# Patient Record
Sex: Male | Born: 1935 | Race: White | Hispanic: No | Marital: Married | Smoking: Never smoker
Health system: Southern US, Community
[De-identification: ages and names within clinical notes are randomized; demographics above are authoritative.]

## PROBLEM LIST (undated history)

## (undated) DIAGNOSIS — G309 Alzheimer's disease, unspecified: Secondary | ICD-10-CM

## (undated) DIAGNOSIS — M109 Gout, unspecified: Secondary | ICD-10-CM

## (undated) DIAGNOSIS — N200 Calculus of kidney: Secondary | ICD-10-CM

## (undated) DIAGNOSIS — N529 Male erectile dysfunction, unspecified: Secondary | ICD-10-CM

## (undated) DIAGNOSIS — E785 Hyperlipidemia, unspecified: Secondary | ICD-10-CM

## (undated) DIAGNOSIS — E119 Type 2 diabetes mellitus without complications: Secondary | ICD-10-CM

## (undated) DIAGNOSIS — I639 Cerebral infarction, unspecified: Secondary | ICD-10-CM

## (undated) DIAGNOSIS — I10 Essential (primary) hypertension: Secondary | ICD-10-CM

## (undated) DIAGNOSIS — R1310 Dysphagia, unspecified: Secondary | ICD-10-CM

## (undated) DIAGNOSIS — I69352 Hemiplegia and hemiparesis following cerebral infarction affecting left dominant side: Secondary | ICD-10-CM

## (undated) DIAGNOSIS — F0280 Dementia in other diseases classified elsewhere without behavioral disturbance: Secondary | ICD-10-CM

## (undated) DIAGNOSIS — N400 Enlarged prostate without lower urinary tract symptoms: Secondary | ICD-10-CM

## (undated) DIAGNOSIS — R011 Cardiac murmur, unspecified: Secondary | ICD-10-CM

## (undated) HISTORY — PX: LITHOTRIPSY: SUR834

## (undated) HISTORY — DX: Gout, unspecified: M10.9

## (undated) HISTORY — PX: APPENDECTOMY: SHX54

## (undated) HISTORY — DX: Dementia in other diseases classified elsewhere, unspecified severity, without behavioral disturbance, psychotic disturbance, mood disturbance, and anxiety: F02.80

## (undated) HISTORY — DX: Essential (primary) hypertension: I10

## (undated) HISTORY — PX: UVULOPALATOPHARYNGOPLASTY: SHX827

## (undated) HISTORY — DX: Hemiplegia and hemiparesis following cerebral infarction affecting left dominant side: I69.352

## (undated) HISTORY — DX: Cerebral infarction, unspecified: I63.9

## (undated) HISTORY — DX: Cardiac murmur, unspecified: R01.1

## (undated) HISTORY — DX: Hyperlipidemia, unspecified: E78.5

## (undated) HISTORY — DX: Male erectile dysfunction, unspecified: N52.9

## (undated) HISTORY — PX: TONSILLECTOMY: SUR1361

## (undated) HISTORY — DX: Alzheimer's disease, unspecified: G30.9

## (undated) HISTORY — DX: Type 2 diabetes mellitus without complications: E11.9

## (undated) HISTORY — DX: Benign prostatic hyperplasia without lower urinary tract symptoms: N40.0

---

## 1997-11-05 ENCOUNTER — Ambulatory Visit (HOSPITAL_COMMUNITY): Admission: RE | Admit: 1997-11-05 | Discharge: 1997-11-05 | Payer: Self-pay | Admitting: Urology

## 2009-12-28 ENCOUNTER — Encounter: Admission: RE | Admit: 2009-12-28 | Discharge: 2009-12-28 | Payer: Self-pay | Admitting: Internal Medicine

## 2010-01-05 ENCOUNTER — Ambulatory Visit (HOSPITAL_COMMUNITY): Admission: RE | Admit: 2010-01-05 | Discharge: 2010-01-05 | Payer: Self-pay | Admitting: Gastroenterology

## 2010-02-09 ENCOUNTER — Ambulatory Visit (HOSPITAL_COMMUNITY): Admission: RE | Admit: 2010-02-09 | Discharge: 2010-02-09 | Payer: Self-pay | Admitting: Gastroenterology

## 2010-05-10 ENCOUNTER — Encounter: Admission: RE | Admit: 2010-05-10 | Discharge: 2010-05-10 | Payer: Self-pay | Admitting: Gastroenterology

## 2010-10-23 LAB — GLUCOSE, CAPILLARY: Glucose-Capillary: 171 mg/dL — ABNORMAL HIGH (ref 70–99)

## 2010-10-24 LAB — BASIC METABOLIC PANEL
Calcium: 9.1 mg/dL (ref 8.4–10.5)
GFR calc Af Amer: >60 mL/min (ref >60)
GFR calc non Af Amer: 59 mL/min — ABNORMAL LOW (ref >60)
Glucose, Bld: 191 mg/dL — ABNORMAL HIGH (ref 70–99)
Potassium: 3.8 mEq/L (ref 3.5–5.1)
Sodium: 143 mEq/L (ref 135–145)

## 2010-10-24 LAB — CBC
Hemoglobin: 12.1 g/dL — ABNORMAL LOW (ref 13.0–17.0)
RBC: 3.88 MIL/uL — ABNORMAL LOW (ref 4.22–5.81)
RDW: 12.6 % (ref 11.5–15.5)
WBC: 9.1 10*3/uL (ref 4.0–10.5)

## 2010-10-24 LAB — GLUCOSE, CAPILLARY

## 2010-11-18 ENCOUNTER — Other Ambulatory Visit: Payer: Self-pay | Admitting: Gastroenterology

## 2010-11-18 DIAGNOSIS — K862 Cyst of pancreas: Secondary | ICD-10-CM

## 2010-11-23 ENCOUNTER — Ambulatory Visit
Admission: RE | Admit: 2010-11-23 | Discharge: 2010-11-23 | Disposition: A | Discharge status: Home / Self Care | Payer: Medicare Other | Source: Ambulatory Visit | Attending: Gastroenterology | Admitting: Gastroenterology

## 2010-11-23 ENCOUNTER — Other Ambulatory Visit: Payer: Self-pay | Admitting: Gastroenterology

## 2010-11-23 DIAGNOSIS — K862 Cyst of pancreas: Secondary | ICD-10-CM

## 2010-11-23 MED ORDER — GADOBENATE DIMEGLUMINE 529 MG/ML IV SOLN
15.0000 mL | Freq: Once | INTRAVENOUS | Status: AC | PRN
  Administered 2010-11-23: 15 mL via INTRAVENOUS

## 2012-02-15 ENCOUNTER — Other Ambulatory Visit: Payer: Self-pay | Admitting: Gastroenterology

## 2012-02-15 DIAGNOSIS — K862 Cyst of pancreas: Secondary | ICD-10-CM

## 2012-02-21 ENCOUNTER — Ambulatory Visit
Admission: RE | Admit: 2012-02-21 | Discharge: 2012-02-21 | Disposition: A | Discharge status: Home / Self Care | Payer: Medicare Other | Source: Ambulatory Visit | Attending: Gastroenterology | Admitting: Gastroenterology

## 2012-02-21 DIAGNOSIS — K862 Cyst of pancreas: Secondary | ICD-10-CM

## 2012-02-21 MED ORDER — GADOBENATE DIMEGLUMINE 529 MG/ML IV SOLN
14.0000 mL | Freq: Once | INTRAVENOUS | Status: AC | PRN
  Administered 2012-02-21: 14 mL via INTRAVENOUS

## 2013-08-14 ENCOUNTER — Ambulatory Visit (INDEPENDENT_AMBULATORY_CARE_PROVIDER_SITE_OTHER): Payer: Medicare Other | Admitting: Neurology

## 2013-08-14 ENCOUNTER — Encounter: Payer: Self-pay | Admitting: Neurology

## 2013-08-14 VITALS — BP 104/62 | HR 84 | Resp 25 | Ht 68.0 in | Wt 146.6 lb

## 2013-08-14 DIAGNOSIS — R413 Other amnesia: Secondary | ICD-10-CM

## 2013-08-14 DIAGNOSIS — G3184 Mild cognitive impairment, so stated: Secondary | ICD-10-CM

## 2013-08-14 DIAGNOSIS — F0280 Dementia in other diseases classified elsewhere without behavioral disturbance: Secondary | ICD-10-CM | POA: Insufficient documentation

## 2013-08-14 DIAGNOSIS — G309 Alzheimer's disease, unspecified: Secondary | ICD-10-CM

## 2013-08-14 NOTE — Progress Notes (Addendum)
NEUROLOGY CONSULTATION NOTE  Roger [name] MRN: [id number] DOB: 02-16-1931  Referring provider: Dr. Earl Gala Primary care provider: Dr. Earl Gala  Reason for consult:  Memory loss.  Question of Lewy Body Dementia.  HISTORY OF PRESENT ILLNESS: Roger [name] is an 80 year old right-handed man with history of hypertension, OSA with CPAP, CKD and type II diabetes mellitus, who presents for memory problems.  He is not accompanied by anyone.  Records and images were personally reviewed where available.  He says he began noticing problems with memory approximately one year ago, however the notes state that he was taking Aricept as early as 2011.  Regarding memory, he specifically notes difficulty recalling people's names, including close friends that he is known since childhood. He often misplaces objects around the house. Occasionally, he will walk into a room and not remember why he went there. He is told that he often repeats questions. Occasionally, he does not remember why he and his wife are going somewhere.  He is able to perform activities of daily living.  He often works in the garden and the tends to the yard.  He is active and participates with charitable activities.  He does drive.  He says he occasionally gets disoriented but nothing significant.  He does not appear to be an aggressive driver.  He does not tailgate or drive fast.  He says that his wife does feel uncomfortable in the car with him and often prefers to do the driving herself.  He does take naps during the day and says that his wife tells him that he sleeps too much.  He denies hallucinations or delusions.  He denies depression.  He does get up in the middle of the night to go to the bathroom and sometimes has difficulty falling back to sleep. He scored 2.5 on the Clinical Dementia Rating, so he was advised not to drive.  He is unsure which medications he tried.  According to the notes, he had tried donepezil but was discontinued because  of nightmares, even at low dose of 5mg .  He was then started on rivastigmine, but this was stopped due to nightmares and sleep disturbance.  Based on the notes, he reportedly had tried Namenda in the past, with similar effects.  According to notes, his wife says he also has been exhibiting a shuffling gait.  He has trouble getting into bed without a stepstool.  He does not have a tremor.  He notes that he does cough at times when eating.  He says he eats slowly, having to chew his food longer.  He has reduced appetite and has experienced weight loss and is borderline malnourished.  He has had problems with urinary incontinence as well.  There was a concern for possible parkinsonism or even Lewy Body Dementia.  He does not have tremor.  He has no history of falls.  He denies symptoms associated with freezing.  When he gets out of bed, he does feel brief vertigo but no lightheadedness.  He is not aware of any family history of a neurodegenerative disease.  He has no history of stroke.  07/23/13 LABS:  Na 142, K 3.9, Cl 3.9, glucose 167, BUN 19, Cr 1.25, Ca 9.7, ALP 70, AST 17, ALT 10.  PAST MEDICAL HISTORY: Past Medical History  Diagnosis Date  . Diabetes   . Hypertension   . Gout     PAST SURGICAL HISTORY: Past Surgical History  Procedure Laterality Date  . Tonsillectomy  MEDICATIONS: No current outpatient prescriptions on file prior to visit.   No current facility-administered medications on file prior to visit.    ALLERGIES: No Known Allergies  FAMILY HISTORY: No family history on file.  SOCIAL HISTORY: History   Social History  . Marital Status: Married    Spouse Name: N/A    Number of Children: N/A  . Years of Education: N/A   Occupational History  . Not on file.   Social History Main Topics  . Smoking status: Never Smoker   . Smokeless tobacco: Not on file  . Alcohol Use: No  . Drug Use: No  . Sexual Activity: Not on file   Other Topics Concern  . Not on  file   Social History Narrative  . No narrative on file    REVIEW OF SYSTEMS: Constitutional: No fevers, chills, or sweats, no generalized fatigue, change in appetite Eyes: No visual changes, double vision, eye pain Ear, nose and throat: No hearing loss, ear pain, nasal congestion, sore throat Cardiovascular: No chest pain, palpitations Respiratory:  No shortness of breath at rest or with exertion, wheezes GastrointestinaI: No nausea, vomiting, diarrhea, abdominal pain, fecal incontinence Genitourinary:  No dysuria.  Some incontinence Musculoskeletal:  No neck pain, back pain Integumentary: No rash, pruritus, skin lesions Neurological: as above Psychiatric: No depression, insomnia, anxiety Endocrine: No palpitations, fatigue, diaphoresis, mood swings, change in appetite, change in weight, increased thirst Hematologic/Lymphatic:  No anemia, purpura, petechiae. Allergic/Immunologic: no itchy/runny eyes, nasal congestion, recent allergic reactions, rashes  PHYSICAL EXAM: Filed Vitals:   08/14/13 0915  BP: 104/62  Pulse: 84  Resp: 25   General: No acute distress Head:  Normocephalic/atraumatic Neck: supple, no paraspinal tenderness, full range of motion Back: No paraspinal tenderness Heart: regular rate and rhythm Lungs: Clear to auscultation bilaterally. Vascular: No carotid bruits. Neurological Exam: Mental status: alert and oriented to person, place, and time, speech fluent and not dysarthric, naming, reading, writing, and repeating intact.  Able to follow 3 step commands across midline.  Poor naming fluency.  Attention is fair.  Abstraction intact.  Recalled 1 out of 3 words on MMSE and unable to recall any words on MOCA.  Able to perform Trail Making test, copying a cube, intersecting pentagons and drawing a clock correctly.  MMSE 25/30, MOCA 24/30. Cranial nerves: CN I: not tested CN II: pupils equal, round and reactive to light, visual fields intact, fundi unremarkable. CN  III, IV, VI:  full range of motion, no nystagmus, no ptosis CN V: facial sensation intact CN VII: upper and lower face symmetric CN VIII: hearing intact CN IX, X: gag intact, uvula midline CN XI: sternocleidomastoid and trapezius muscles intact CN XII: tongue midline Bulk & Tone: normal, no fasciculations. Motor: 5/5 throughout, no rigidity or bradykinesia, although he has mild reduced amplitude of finger-thumb tapping but normal speed.  No resting, postural or kinetic tremor. Sensation: temperature and vibration intact. Deep Tendon Reflexes: 2+throughout, but absent in ankles, toes down Finger to nose testing: no tremor, dysmetria Gait: upright posture with normal arm swing.  No shuffling.  Good stride and able to pick up feet.  Turns quickly in 1 step. Able to walk in tandem.  Romberg negative.  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  He does not exhibit symptoms consistent with Lewy Body Dementia or Parkinson's disease.  The cognitive impairment primarily involves memory and visuospatial and executive functioning are intact.  I don't exhibit any significant parkinsonism on exam.  He does not have hallucinations.  As for the driving, further evaluation is recommended if MMSE is <24.  He scored a 25.  However, based on the previous Clinical Dementia Rating score, and the fact that his wife reportedly feels unsafe in the car, I cannot recommend that he can drive until his driving is evaluated by an OT.  PLAN: 1.  Referral to OT for driving assessment. 2.  Given that he has not responded to three medications, I don't think it is worth trying another medication at this point.  We can consider galantamine, however it is also a cholinesterase inhibitor with same side effect profile. 3.  Recommend continuing to remain active and social 4.  Provided info of support groups and websites. 5.  He says he started a B12 supplement a couple of months  ago.  We will check B12 level to make sure it is okay.  We will check TSH as well. 5.  Follow up in 6 months or as needed.  60 minutes spent with patient, over 50% spent counseling and coordinating care.  Thank you for allowing me to take part in the care of this patient.  Shon MilletAdam Jaffe, DO  CC:  Theressa MillardJames Osborne, MD

## 2013-08-14 NOTE — Patient Instructions (Addendum)
Mr. Roger [name], I think you do have Alzheimer's disease.  This is a progressive condition, but right now, I feel you are doing well. 1.  You have had side effects to 3 common medications used, so I really don't think trying another medication would be much helpful, since they all have similar side effects.  2.  At this point, I don't think you should drive until you are evaluated by an occupational therapist to assess your driving.  It will cost $250 but would be worth it to see if you can drive.  We will give you this information. We will send a referral for you. The contact name is Renata Caprice and his number is [phone]. 3.  Continue to stay active and social. 4.  We will provide you information with websites and support groups. 5.  Follow up in 6 months but call with questions or concerns.

## 2013-08-15 ENCOUNTER — Telehealth: Payer: Self-pay | Admitting: Neurology

## 2013-08-15 LAB — VITAMIN B12: Vitamin B-12: 1032 pg/mL — ABNORMAL HIGH (ref 211–911)

## 2013-08-15 LAB — TSH: TSH: 1.608 u[IU]/mL (ref 0.350–4.500)

## 2013-08-15 NOTE — Telephone Encounter (Signed)
Message copied by Benay Spice on Fri Aug 15, 2013  9:09 AM ------      Message from: JAFFE, ADAM R      Created: Fri Aug 15, 2013  5:50 AM       Blood work looks okay.      ----- Message -----         From: [facility]         Sent: 08/15/2013   2:22 AM           To: Cira Servant, DO                   ------

## 2013-08-15 NOTE — Telephone Encounter (Signed)
Spoke with the patient's wife. Information given as per Dr. Everlena CooperJaffe below. No additional questions.

## 2013-12-15 ENCOUNTER — Ambulatory Visit: Payer: Medicare Other | Admitting: Neurology

## 2013-12-16 ENCOUNTER — Telehealth: Payer: Self-pay | Admitting: Neurology

## 2013-12-16 NOTE — Telephone Encounter (Signed)
Pt no showed 12/15/13 follow up appt w/ Dr. Everlena Cooper. No show letter mailed to pt / Sherri S.

## 2014-02-11 ENCOUNTER — Ambulatory Visit: Payer: Medicare Other | Admitting: Neurology

## 2014-02-13 ENCOUNTER — Ambulatory Visit (INDEPENDENT_AMBULATORY_CARE_PROVIDER_SITE_OTHER): Payer: Medicare Other | Admitting: Neurology

## 2014-02-13 ENCOUNTER — Encounter: Payer: Self-pay | Admitting: Neurology

## 2014-02-13 VITALS — BP 162/74 | HR 60 | Resp 14 | Ht 68.5 in | Wt 139.0 lb

## 2014-02-13 DIAGNOSIS — F0280 Dementia in other diseases classified elsewhere without behavioral disturbance: Secondary | ICD-10-CM

## 2014-02-13 DIAGNOSIS — R279 Unspecified lack of coordination: Secondary | ICD-10-CM

## 2014-02-13 DIAGNOSIS — R270 Ataxia, unspecified: Secondary | ICD-10-CM

## 2014-02-13 DIAGNOSIS — R1314 Dysphagia, pharyngoesophageal phase: Secondary | ICD-10-CM

## 2014-02-13 DIAGNOSIS — G309 Alzheimer's disease, unspecified: Principal | ICD-10-CM

## 2014-02-13 NOTE — Progress Notes (Signed)
Roger [name] was seen today in the movement disorders clinic for neurologic consultation at the request of Roger Finer, MD.  This is a second opinion regarding parkinsonism.  The patient has a ready seen my partner, Dr. Tomi [name], but the patient did not show up for his followup visit in May with him.  The patient is accompanied by his wife, who supplements the history.    Memory problems began over one year ago per wife, and it is noted that aricept was first trialed in 2011.  Pt notes that early on he would have trouble following conversations.  His wife noted that early problems included word finding problems and naming trouble.  No hallucinations.  Some loss of bladder control; started at night and has become a daytime issue for about 6-8 months.  He has had slowness and balance problems for about 6-8 months to perhaps a year.    Specific Symptoms:  Tremor: No. Voice: yes, hypophonic speech Sleep: trouble staying asleep because of nocturia  Vivid Dreams:  No.  Acting out dreams:  No. Wet Pillows: No. Postural symptoms:  No.  Falls?  No. Bradykinesia symptoms: slow movements and difficulty getting out of a chair (slower); walks slower Loss of smell:  No. Loss of taste:  Yes.   Urinary Incontinence:  Yes.   Difficulty Swallowing:  Yes.   (big vitamin pills and may get "strangled" on foods; takes long time to eat; eats vegetables well but long time to eat.  His wife is concerned about weight loss.  She bought boost, but raised blood sugars.  She has tried Glucerna.) Handwriting, micrographia: No. Trouble with ADL's:  No.  Trouble buttoning clothing: No. Depression:  No. Memory changes:  Yes (wife does finance, drives, helps with pills, and does the cooking) Hallucinations:  No.  visual distortions: No. N/V:  No. Lightheaded:  Yes.   if gets up too quickly  Syncope: No. Diplopia:  No. Dyskinesia:  No.  Neuroimaging has not previously been performed.    PREVIOUS  MEDICATIONS:  Aricept, Exelon and Namenda (nightmares with Roger [name])  ALLERGIES:  No Known Allergies  CURRENT MEDICATIONS:  Current Outpatient Prescriptions on File Prior to Visit  Medication Sig Dispense Refill  . Blood Glucose Monitoring Suppl (ONE TOUCH ULTRA 2) W/DEVICE KIT       . COLCRYS 0.6 MG tablet Take 0.6 mg by mouth daily.       Marland Kitchen losartan (COZAAR) 100 MG tablet Take 100 mg by mouth daily.       . metFORMIN (GLUCOPHAGE-XR) 500 MG 24 hr tablet Take 500 mg by mouth daily with breakfast.       . Multiple Vitamin (MULTIVITAMIN) tablet Take 1 tablet by mouth daily.      . Red Yeast Rice Extract (RED YEAST RICE PO) Take by mouth daily.      Marland Kitchen Specialty Vitamins Products (PROSTATE PO) Take by mouth daily.       No current facility-administered medications on file prior to visit.    PAST MEDICAL HISTORY:   Past Medical History  Diagnosis Date  . Diabetes   . Hypertension   . Gout     PAST SURGICAL HISTORY:   Past Surgical History  Procedure Laterality Date  . Tonsillectomy    . Appendectomy    . Lithotripsy    . Uvulopalatopharyngoplasty      for snoring    SOCIAL HISTORY:   History   Social History  . Marital Status: Married  Spouse Name: N/A    Number of Children: N/A  . Years of Education: N/A   Occupational History  . retired     Engineer, materials   Social History Main Topics  . Smoking status: Never Smoker   . Smokeless tobacco: Not on file  . Alcohol Use: No  . Drug Use: No  . Sexual Activity: Not on file   Other Topics Concern  . Not on file   Social History Narrative  . No narrative on file    FAMILY HISTORY:   Family Status  Relation Status Death Age  . Mother Deceased     "old age"  . Father Deceased     HTN, leukemia  . Brother Deceased     suicide  . Sister Deceased     ovarian cancer  . Sister Alive     stroke  . Son Deceased     MVA  . Daughter Alive     healthy    ROS:  A complete 10 system review of systems was obtained and  was unremarkable apart from what is mentioned above.  PHYSICAL EXAMINATION:    VITALS:  Patient has not taken his blood pressure medication today. Filed Vitals:   02/13/14 0825  BP: 162/74  Pulse: 60  Resp: 14  Height: 5' 8.5" (1.74 m)  Weight: 139 lb (63.05 kg)   Wt Readings from Last 3 Encounters:  02/13/14 139 lb (63.05 kg)  08/14/13 146 lb 9 oz (66.48 kg)     GEN:  The patient appears stated age and is in NAD. HEENT:  Normocephalic, atraumatic.  The mucous membranes are moist. The superficial temporal arteries are without ropiness or tenderness.  He does have a black coating on the tongue CV:  RRR Lungs:  CTAB Neck/HEME:  There are no carotid bruits bilaterally.  Neurological examination:  Orientation:   Montreal Cognitive Assessment  02/13/2014  Visuospatial/ Executive (0/5) 4  Naming (0/3) 3  Attention: Read list of digits (0/2) 2  Attention: Read list of letters (0/1) 1  Attention: Serial 7 subtraction starting at 100 (0/3) 1  Language: Repeat phrase (0/2) 2  Language : Fluency (0/1) 0  Abstraction (0/2) 2  Delayed Recall (0/5) 0  Orientation (0/6) 3  Total 18  Adjusted Score (based on education) 18   Pt scored a 18/30 on the MoCA today.   Pt scored a 24/30 on his MoCA in January, 2015.  He has some difficulty following the conversation. Cranial nerves: There is good facial symmetry. Pupils are equal round but minimally reactive to light bilaterally. Fundoscopic exam is attempted but the disc margins are not well visualized bilaterally.  Extraocular muscles are intact. The visual fields are full to confrontational testing. The speech is fluent and clear.  He is hypophonic.  Soft palate rises symmetrically and there is no tongue deviation. Hearing is decreased to conversational tone. Sensation: Sensation is intact to light and pinprick throughout (facial, trunk, extremities). Vibration is intact at the bilateral big toe. There is no extinction with double  simultaneous stimulation. There is no sensory dermatomal level identified. Motor: Strength is 5/5 in the bilateral upper and lower extremities.   Shoulder shrug is equal and symmetric.  There is no pronator drift. Deep tendon reflexes: Deep tendon reflexes are 3-3+/4 at the bilateral biceps, triceps, brachioradialis, patella and achilles.  Cross adductor reflexes are obtained.  Pectoral reflexes are obtained. Plantar responses are downgoing bilaterally. Frontal release signs: Patient has bilateral palmomental, glabellar, snout and  rooting reflexes.  Movement examination: Tone: There is normal tone in the bilateral upper extremities.  The tone in the lower extremities is normal.  Abnormal movements: None Coordination:  There is no significant decremation with RAM's Gait and Station: The patient has mild difficulty arising out of a deep-seated chair without the use of the hands.  He is successful in the second attempt.  His gait is unsteady.  ASSESSMENT/PLAN:  1.  Dementia.  -I. agree with Dr. Tomi [name] that this likely represents Alzheimer's dementia, but it has progressed more rapidly than one would expect.  His MoCA in January for Dr. Tomi [name] was 24/30 and today it was 18/30.  He may need a bigger workup for more rapidly progressive dementias, but I will leave that to the expertise of Dr. Tomi [name].  - I see no evidence of Lewy body dementia.  I see no evidence of any parkinsonian state, including idiopathic Parkinson's disease.  -The patient may benefit from neuro imaging given the rapidly progressive dementia and hyperreflexia, but again I will leave that to the expertise of Dr. Tomi [name].  I did discuss this with Dr. Tomi [name]. 2.  Dysphagia.  -Patient would likely benefit from a modified barium swallow.  He has lost weight.  He will discuss this with Dr. Tomi [name] at his appointment that I made for him.  We discussed adding Glucerna more regularly.  If that raises his blood sugar, then we may need to refer him for  a nutritional consult. 3.  Ataxia.  -I. think he would benefit from some physical therapy, even if it his home physical therapy.  He was somewhat resistant to this, but said that he would think about this and discuss it with Dr. Tomi [name] at his next appointment. 4.  we discussed safety extensively, as it relates to memory loss.  He is no longer driving.  His wife has taken over the finances.  Greater than 50% of 60 min visit in counseling 5.  F/u with Dr. Tomi [name].  Appt made with him and expressed importance of keeping that appointment.

## 2014-02-20 ENCOUNTER — Ambulatory Visit (INDEPENDENT_AMBULATORY_CARE_PROVIDER_SITE_OTHER): Payer: Medicare Other | Admitting: Neurology

## 2014-02-20 ENCOUNTER — Encounter: Payer: Self-pay | Admitting: Neurology

## 2014-02-20 VITALS — BP 140/68 | HR 68 | Temp 98.0°F | Resp 18 | Wt 141.0 lb

## 2014-02-20 DIAGNOSIS — R292 Abnormal reflex: Secondary | ICD-10-CM

## 2014-02-20 DIAGNOSIS — G309 Alzheimer's disease, unspecified: Principal | ICD-10-CM

## 2014-02-20 DIAGNOSIS — F0280 Dementia in other diseases classified elsewhere without behavioral disturbance: Secondary | ICD-10-CM

## 2014-02-20 DIAGNOSIS — R4789 Other speech disturbances: Secondary | ICD-10-CM

## 2014-02-20 DIAGNOSIS — R4702 Dysphasia: Secondary | ICD-10-CM

## 2014-02-20 MED ORDER — MEMANTINE HCL ER 7 MG PO CP24: ORAL_CAPSULE | ORAL | Status: DC

## 2014-02-20 NOTE — Patient Instructions (Addendum)
1.  We will start memantine ER (Namenda XR) 7mg  tablets.  We will increase dose to goal of 28mg  daily, as per the following schedule.  Start 1 tablet daily for one week, then 2 tablets daily for one week, then 3 tablets daily for one week.  At that point, call the clinic and we can prescribe a larger single dose tablet of 28mg , to be taken daily.  Side effects include dizziness, headache, diarrhea or constipation.  Call with any questions or concerns. 2.  We will get MRI of the brain 3.  We will schedule you for a modified barium swallow to check your swallowing 4.  We will schedule you for neuropsychological testing.

## 2014-02-20 NOTE — Progress Notes (Addendum)
NEUROLOGY FOLLOW UP OFFICE NOTE  Roger [name] [id number]  HISTORY OF PRESENT ILLNESS: Roger [name] is an 80 year old right-handed man with history of hypertension, OSA with CPAP, CKD and type II diabetes mellitus, who follows up for memory problems.  He is not accompanied by anyone.  Records and images were personally reviewed where available.    UPDATE: 08/14/13 LABS:  B12 1032, TSH 1.608  Underwent a formal driving assessment on 09/09/13.  He did demonstrate some minor deficits, such as with slow cognitive processing when he was unsure how to turn off the radio, and with divided attention when he significantly slowed the rate of speed when trying to answer a questions.  He was scheduled for a follow-up but never showed or rescheduled.  She was seen by my colleague, Dr. Carles Collet, when he demonstrated a MoCA score of 18/30, which was a fairly significant decline compared to his score of 24/30 in January.  He also exhibited hyperreflexia on exam as well.   He and his wife says things are stable.  He drives locally.  He still notes trouble swallowing meat and thin liquids.  HISTORY: He says he began noticing problems with memory approximately one year ago, however the notes state that he was taking Aricept as early as 2011.  Regarding memory, he specifically notes difficulty recalling people's names, including close friends that he is known since childhood. He often misplaces objects around the house. Occasionally, he will walk into a room and not remember why he went there. He is told that he often repeats questions. Occasionally, he does not remember why he and his wife are going somewhere.  He is able to perform activities of daily living.  He often works in the garden and the tends to the yard.  He is active and participates with charitable activities.  He does drive.  He says he occasionally gets disoriented but nothing significant.  He does not appear to be an aggressive driver.  He does not tailgate  or drive fast.  He says that his wife does feel uncomfortable in the car with him and often prefers to do the driving herself.  He does take naps during the day and says that his wife tells him that he sleeps too much.  He denies hallucinations or delusions.  He denies depression.  He does get up in the middle of the night to go to the bathroom and sometimes has difficulty falling back to sleep. He scored 2.5 on the Clinical Dementia Rating, so he was advised not to drive.  He is unsure which medications he tried.  According to the notes, he had tried donepezil but was discontinued because of nightmares, even at low dose of 87m.  He was then started on rivastigmine, but this was stopped due to nightmares and sleep disturbance.  Based on the notes, he reportedly had tried Namenda in the past, with similar effects.  According to notes, his wife says he also has been exhibiting a shuffling gait.  He has trouble getting into bed without a stepstool.  He does not have a tremor.  He notes that he does cough at times when eating.  He says he eats slowly, having to chew his food longer.  He has reduced appetite and has experienced weight loss and is borderline malnourished.  He has had problems with urinary incontinence as well.  There was a concern for possible parkinsonism or even Lewy Body Dementia.  He does not have tremor.  He has no history of falls.  He denies symptoms associated with freezing.  When he gets out of bed, he does feel brief vertigo but no lightheadedness.  He is not aware of any family history of a neurodegenerative disease.  He has no history of stroke.  07/23/13 LABS:  Na 142, K 3.9, Cl 3.9, glucose 167, BUN 19, Cr 1.25, Ca 9.7, ALP 70, AST 17, ALT 10.  PAST MEDICAL HISTORY: Past Medical History  Diagnosis Date  . Diabetes   . Hypertension   . Gout     MEDICATIONS: Current Outpatient Prescriptions on File Prior to Visit  Medication Sig Dispense Refill  . Blood Glucose Monitoring  Suppl (ONE TOUCH ULTRA 2) W/DEVICE KIT       . COLCRYS 0.6 MG tablet Take 0.6 mg by mouth daily.       Marland Kitchen losartan (COZAAR) 100 MG tablet Take 100 mg by mouth daily.       . metFORMIN (GLUCOPHAGE-XR) 500 MG 24 hr tablet Take 500 mg by mouth daily with breakfast.       . Multiple Vitamin (MULTIVITAMIN) tablet Take 1 tablet by mouth daily.      . Red Yeast Rice Extract (RED YEAST RICE PO) Take by mouth daily.      Marland Kitchen Specialty Vitamins Products (PROSTATE PO) Take by mouth daily.       No current facility-administered medications on file prior to visit.    ALLERGIES: No Known Allergies  FAMILY HISTORY: Family History  Problem Relation Age of Onset  . Ataxia Neg Hx   . Chorea Neg Hx   . Dementia Neg Hx   . Mental retardation Neg Hx   . Migraines Neg Hx   . Multiple sclerosis Neg Hx   . Neurofibromatosis Neg Hx   . Neuropathy Neg Hx   . Parkinsonism Neg Hx   . Seizures Neg Hx   . Stroke Neg Hx     SOCIAL HISTORY: History   Social History  . Marital Status: Married    Spouse Name: N/A    Number of Children: N/A  . Years of Education: N/A   Occupational History  . retired     Engineer, materials   Social History Main Topics  . Smoking status: Never Smoker   . Smokeless tobacco: Not on file  . Alcohol Use: No  . Drug Use: No  . Sexual Activity: Not on file   Other Topics Concern  . Not on file   Social History Narrative  . No narrative on file    REVIEW OF SYSTEMS: Constitutional: No fevers, chills, or sweats, no generalized fatigue, change in appetite Eyes: No visual changes, double vision, eye pain Ear, nose and throat: No hearing loss, ear pain, nasal congestion, sore throat Cardiovascular: No chest pain, palpitations Respiratory:  No shortness of breath at rest or with exertion, wheezes GastrointestinaI: No nausea, vomiting, diarrhea, abdominal pain, fecal incontinence Genitourinary:  No dysuria, urinary retention or frequency Musculoskeletal:  No neck pain, back  pain Integumentary: No rash, pruritus, skin lesions Neurological: as above Psychiatric: No depression, insomnia, anxiety Endocrine: No palpitations, fatigue, diaphoresis, mood swings, change in appetite, change in weight, increased thirst Hematologic/Lymphatic:  No anemia, purpura, petechiae. Allergic/Immunologic: no itchy/runny eyes, nasal congestion, recent allergic reactions, rashes  PHYSICAL EXAM: Filed Vitals:   02/20/14 1028  BP: 140/68  Pulse: 68  Temp: 98 F (36.7 C)  Resp: 18   General: No acute distress Head:  Normocephalic/atraumatic Neck: supple, no paraspinal tenderness,  full range of motion Heart:  Regular rate and rhythm Lungs:  Clear to auscultation bilaterally Back: No paraspinal tenderness Neurological Exam: alert and oriented to person, place, month and day (not date or year). Attention span and concentration intact, recent memory impaired and remote memory intact, fund of knowledge intact.  Speech fluent and not dysarthric, language intact.  CN II-XII intact. Fundoscopic exam unremarkable without vessel changes, exudates, hemorrhages or papilledema.  Bulk and tone normal, muscle strength 5/5 throughout.  Sensation to light touch, temperature and vibration intact.  Deep tendon reflexes 3+ throughout, toes downgoing.  Finger to nose and heel to shin testing intact.  Gait unsteady.  IMPRESSION: Probable Alzheimer's dementia, although the cognitive decline on MoCA seems fairly rapid. Dysphagia.  PLAN: 1.  Given rapid decline and hyperreflexia, will get MRI of the brain with and without contrast to assess any focal abnormalities that would assist a diagnosis. 2.  Will start Aricept 21m at bedtime.  Will increase to 163min 4 weeks if tolerating. 3.  Formal neuropsych testing. 4.  Modified barium swallow 5.  Advised to limit driving to 5-9-56ile radius, avoiding interstate driving above 45 mph, avoid driving during poor weather, and only during daylight hours. 6.   Follow up after testing.  AdMetta ClinesDO  CC:  JaNehemiah SettleMD

## 2014-02-20 NOTE — Progress Notes (Signed)
Done

## 2014-03-09 ENCOUNTER — Other Ambulatory Visit (HOSPITAL_COMMUNITY): Payer: Self-pay | Admitting: Neurology

## 2014-03-09 DIAGNOSIS — R1310 Dysphagia, unspecified: Secondary | ICD-10-CM

## 2014-03-12 ENCOUNTER — Ambulatory Visit (HOSPITAL_COMMUNITY)
Admission: RE | Admit: 2014-03-12 | Discharge: 2014-03-12 | Disposition: A | Discharge status: Home / Self Care | Payer: Medicare Other | Source: Ambulatory Visit | Attending: Neurology | Admitting: Neurology

## 2014-03-12 DIAGNOSIS — G309 Alzheimer's disease, unspecified: Secondary | ICD-10-CM | POA: Diagnosis not present

## 2014-03-12 DIAGNOSIS — F0280 Dementia in other diseases classified elsewhere without behavioral disturbance: Secondary | ICD-10-CM | POA: Insufficient documentation

## 2014-03-12 DIAGNOSIS — R1310 Dysphagia, unspecified: Secondary | ICD-10-CM | POA: Diagnosis present

## 2014-03-12 NOTE — Procedures (Signed)
Objective Swallowing Evaluation: Modified Barium Swallowing Study  Patient Details  Name: Roger [name] MRN: [id number] Date of Birth: [birth year]  Today's Date: 03/12/2014 Time: 1030-1100 SLP Time Calculation (min): 30 min  Past Medical History:  Past Medical History  Diagnosis Date  . Diabetes   . Hypertension   . Gout    Past Surgical History:  Past Surgical History  Procedure Laterality Date  . Tonsillectomy    . Appendectomy    . Lithotripsy    . Uvulopalatopharyngoplasty      for snoring   HPI:  Pt is an 66 yer old male referred by neurologist due to complaint of coughing during meals, prolonged chewing, slow eating, weight loss. Pt has experienced cognitive decline and MD reports probable Alzheimer's dementia.      Assessment / Plan / Recommendation Clinical Impression  Clinical impression: Pt demonstrates a moderate oral dysphagia with lingual rocking as pt struggle to propel and trigger swallow response with most boluses. Pt is able to masticate and transit solids well, but there is mild oral residue with thin that spills to base of tongue and valleculae post swallow. Oropharyngeal dysphagia characterized by delay in swallow initiation with weakness of all hyolaryngeal structures.  There is mild penetration before the swallow and then mild penetration of vallecular and pyriform residuals post swallow that pt intermittently senses as it accumulates on vocal folds and tracheal wall. Chin tucks and supraglottic swallow attempted and not effective. Thickened liquids increase residuals. Recommend a preventative throat clear and second swallow x2 to clear residuals. Recommend pt continue a regular texture diet and thin liquids with strategies. Pt would benefit from f/u with an outpatient SLP for pharyngeal strengthening exercises and cognitive therapy.     Treatment Recommendation  Defer treatment plan to SLP at (Comment)    Diet Recommendation Regular;Thin liquid   Liquid  Administration via: Cup;Straw Medication Administration: Whole meds with puree Supervision: Patient able to self feed Compensations: Slow rate;Small sips/bites;Multiple dry swallows after each bite/sip;Clear throat after each swallow Postural Changes and/or Swallow Maneuvers: Seated upright 90 degrees    Other  Recommendations Oral Care Recommendations: Oral care BID   Follow Up Recommendations  Outpatient SLP    Frequency and Duration        Pertinent Vitals/Pain NA    SLP Swallow Goals     General HPI: Pt is an 41 yer old male referred by neurologist due to complaint of coughing during meals, prolonged chewing, slow eating, weight loss. Pt has experienced cognitive decline and MD reports probable Alzheimer's dementia.  Type of Study: Modified Barium Swallowing Study Reason for Referral: Objectively evaluate swallowing function Previous Swallow Assessment: none Diet Prior to this Study: Regular;Thin liquids Temperature Spikes Noted: N/A Respiratory Status: Room air History of Recent Intubation: No Behavior/Cognition: Alert;Cooperative;Pleasant mood Oral Cavity - Dentition: Adequate natural dentition Oral Motor / Sensory Function: Within functional limits Self-Feeding Abilities: Able to feed self Patient Positioning: Upright in chair Baseline Vocal Quality: Clear Volitional Cough: Strong Volitional Swallow: Able to elicit Anatomy: Within functional limits Pharyngeal Secretions: Not observed secondary MBS    Reason for Referral Objectively evaluate swallowing function   Oral Phase Oral Preparation/Oral Phase Oral Phase: Impaired Oral - Nectar Oral - Nectar Cup: Lingual pumping;Reduced posterior propulsion;Delayed oral transit;Lingual/palatal residue Oral - Thin Oral - Thin Cup: Lingual pumping;Reduced posterior propulsion;Delayed oral transit;Lingual/palatal residue Oral - Thin Straw: Lingual pumping;Reduced posterior propulsion;Delayed oral transit;Lingual/palatal  residue Oral - Solids Oral - Puree: Lingual pumping;Reduced posterior propulsion;Delayed oral transit;Lingual/palatal residue  Oral - Regular: Lingual pumping;Reduced posterior propulsion;Delayed oral transit;Lingual/palatal residue Oral - Pill: Lingual pumping;Reduced posterior propulsion;Delayed oral transit;Lingual/palatal residue   Pharyngeal Phase Pharyngeal Phase Pharyngeal Phase: Impaired Pharyngeal - Nectar Pharyngeal - Nectar Cup: Delayed swallow initiation;Reduced airway/laryngeal closure;Reduced laryngeal elevation;Reduced anterior laryngeal mobility;Reduced epiglottic inversion;Reduced pharyngeal peristalsis;Penetration/Aspiration before swallow;Penetration/Aspiration after swallow;Trace aspiration;Pharyngeal residue - valleculae Penetration/Aspiration details (nectar cup): Material enters airway, CONTACTS cords and not ejected out Pharyngeal - Thin Pharyngeal - Thin Cup: Delayed swallow initiation;Reduced airway/laryngeal closure;Reduced laryngeal elevation;Reduced anterior laryngeal mobility;Reduced epiglottic inversion;Reduced pharyngeal peristalsis;Penetration/Aspiration before swallow;Penetration/Aspiration after swallow;Trace aspiration;Pharyngeal residue - valleculae Penetration/Aspiration details (thin cup): Material enters airway, passes BELOW cords without attempt by patient to eject out (silent aspiration);Material enters airway, CONTACTS cords and not ejected out Pharyngeal - Thin Straw: Delayed swallow initiation;Reduced airway/laryngeal closure;Reduced laryngeal elevation;Reduced anterior laryngeal mobility;Reduced epiglottic inversion;Reduced pharyngeal peristalsis;Penetration/Aspiration before swallow;Penetration/Aspiration after swallow;Trace aspiration;Pharyngeal residue - valleculae Penetration/Aspiration details (thin straw): Material enters airway, passes BELOW cords without attempt by patient to eject out (silent aspiration);Material enters airway, CONTACTS cords and  not ejected out;Material enters airway, passes BELOW cords then ejected out Pharyngeal - Solids Pharyngeal - Puree: Delayed swallow initiation;Reduced airway/laryngeal closure;Reduced laryngeal elevation;Reduced anterior laryngeal mobility;Reduced epiglottic inversion;Reduced pharyngeal peristalsis;Pharyngeal residue - valleculae Penetration/Aspiration details (puree): Material does not enter airway Pharyngeal - Regular: Delayed swallow initiation;Reduced airway/laryngeal closure;Reduced laryngeal elevation;Reduced anterior laryngeal mobility;Reduced epiglottic inversion;Reduced pharyngeal peristalsis;Pharyngeal residue - valleculae Pharyngeal - Pill: Delayed swallow initiation;Reduced airway/laryngeal closure;Reduced laryngeal elevation;Reduced anterior laryngeal mobility;Reduced epiglottic inversion;Reduced pharyngeal peristalsis;Pharyngeal residue - valleculae  Cervical Esophageal Phase    GO         Functional Assessment Tool Used: clinical judgement Functional Limitations: Swallowing Swallow Current Status ([id number]): At least 40 percent but less than 60 percent impaired, limited or restricted Swallow Goal Status [phone]([id number]): At least 40 percent but less than 60 percent impaired, limited or restricted Swallow Discharge Status [phone]([id number]): At least 40 percent but less than 60 percent impaired, limited or restricted   Baylor University Medical CenterBonnie Tiesha Marich, MA CCC-SLP [phone]  Claudine MoutonDeBlois, Faraz Ponciano Caroline 03/12/2014, 2:04 PM

## 2014-03-24 ENCOUNTER — Telehealth: Payer: Self-pay | Admitting: *Deleted

## 2014-03-24 NOTE — Telephone Encounter (Signed)
Message copied by Fredirick Maudlin on Tue Mar 24, 2014  9:04 AM ------      Message from: JAFFE, ADAM R      Created: Tue Mar 24, 2014  7:57 AM       MRI reveals evidence of old tiny strokes.  He does have stroke risk factors such as hypertension, which may be contributing.  I recommend starting aspirin 81mg  daily for secondary stroke prevention.  I would also like to check a fasting lipid panel.  Also, it appears he has not scheduled an appointment yet with [facility] and I would like to follow up on that, since I would want to see him afterwards.      ----- Message -----         From: Rad Results In Interface         Sent: 03/12/2014  12:53 PM           To: Cira Servant, DO                   ------

## 2014-03-24 NOTE — Telephone Encounter (Signed)
Left  Message for patient to return call  To office

## 2015-08-12 ENCOUNTER — Ambulatory Visit
Admission: RE | Admit: 2015-08-12 | Discharge: 2015-08-12 | Disposition: A | Discharge status: Home / Self Care | Payer: Medicare Other | Source: Ambulatory Visit | Attending: Internal Medicine | Admitting: Internal Medicine

## 2015-08-12 ENCOUNTER — Other Ambulatory Visit: Payer: Self-pay | Admitting: Internal Medicine

## 2015-08-12 DIAGNOSIS — I509 Heart failure, unspecified: Secondary | ICD-10-CM

## 2015-10-04 ENCOUNTER — Inpatient Hospital Stay (HOSPITAL_COMMUNITY): Payer: Medicare Other

## 2015-10-04 ENCOUNTER — Inpatient Hospital Stay (HOSPITAL_COMMUNITY)
Admission: EM | Admit: 2015-10-04 | Discharge: 2015-10-07 | DRG: 065 | Disposition: A | Discharge status: Rehabilitation Facility | Payer: Medicare Other | Attending: Family Medicine | Admitting: Family Medicine

## 2015-10-04 ENCOUNTER — Encounter (HOSPITAL_COMMUNITY): Payer: Self-pay | Admitting: *Deleted

## 2015-10-04 ENCOUNTER — Emergency Department (HOSPITAL_COMMUNITY): Payer: Medicare Other

## 2015-10-04 DIAGNOSIS — R29810 Facial weakness: Secondary | ICD-10-CM | POA: Diagnosis present

## 2015-10-04 DIAGNOSIS — R5381 Other malaise: Secondary | ICD-10-CM

## 2015-10-04 DIAGNOSIS — R1310 Dysphagia, unspecified: Secondary | ICD-10-CM | POA: Diagnosis present

## 2015-10-04 DIAGNOSIS — G8194 Hemiplegia, unspecified affecting left nondominant side: Secondary | ICD-10-CM | POA: Diagnosis not present

## 2015-10-04 DIAGNOSIS — Z7982 Long term (current) use of aspirin: Secondary | ICD-10-CM

## 2015-10-04 DIAGNOSIS — I6789 Other cerebrovascular disease: Secondary | ICD-10-CM | POA: Diagnosis not present

## 2015-10-04 DIAGNOSIS — E785 Hyperlipidemia, unspecified: Secondary | ICD-10-CM | POA: Diagnosis present

## 2015-10-04 DIAGNOSIS — E1142 Type 2 diabetes mellitus with diabetic polyneuropathy: Secondary | ICD-10-CM | POA: Diagnosis not present

## 2015-10-04 DIAGNOSIS — R471 Dysarthria and anarthria: Secondary | ICD-10-CM | POA: Diagnosis present

## 2015-10-04 DIAGNOSIS — I69991 Dysphagia following unspecified cerebrovascular disease: Secondary | ICD-10-CM | POA: Diagnosis not present

## 2015-10-04 DIAGNOSIS — E119 Type 2 diabetes mellitus without complications: Secondary | ICD-10-CM

## 2015-10-04 DIAGNOSIS — R29702 NIHSS score 2: Secondary | ICD-10-CM | POA: Diagnosis present

## 2015-10-04 DIAGNOSIS — R531 Weakness: Secondary | ICD-10-CM

## 2015-10-04 DIAGNOSIS — G309 Alzheimer's disease, unspecified: Secondary | ICD-10-CM | POA: Diagnosis present

## 2015-10-04 DIAGNOSIS — I10 Essential (primary) hypertension: Secondary | ICD-10-CM | POA: Diagnosis not present

## 2015-10-04 DIAGNOSIS — Z888 Allergy status to other drugs, medicaments and biological substances status: Secondary | ICD-10-CM

## 2015-10-04 DIAGNOSIS — R4781 Slurred speech: Secondary | ICD-10-CM | POA: Diagnosis present

## 2015-10-04 DIAGNOSIS — R269 Unspecified abnormalities of gait and mobility: Secondary | ICD-10-CM | POA: Diagnosis not present

## 2015-10-04 DIAGNOSIS — F0390 Unspecified dementia without behavioral disturbance: Secondary | ICD-10-CM | POA: Diagnosis not present

## 2015-10-04 DIAGNOSIS — M6289 Other specified disorders of muscle: Secondary | ICD-10-CM | POA: Diagnosis not present

## 2015-10-04 DIAGNOSIS — N179 Acute kidney failure, unspecified: Secondary | ICD-10-CM | POA: Diagnosis not present

## 2015-10-04 DIAGNOSIS — I638 Other cerebral infarction: Principal | ICD-10-CM | POA: Diagnosis present

## 2015-10-04 DIAGNOSIS — R2990 Unspecified symptoms and signs involving the nervous system: Secondary | ICD-10-CM

## 2015-10-04 DIAGNOSIS — I63312 Cerebral infarction due to thrombosis of left middle cerebral artery: Secondary | ICD-10-CM | POA: Diagnosis present

## 2015-10-04 DIAGNOSIS — R42 Dizziness and giddiness: Secondary | ICD-10-CM | POA: Diagnosis not present

## 2015-10-04 DIAGNOSIS — M109 Gout, unspecified: Secondary | ICD-10-CM | POA: Diagnosis present

## 2015-10-04 DIAGNOSIS — Z7984 Long term (current) use of oral hypoglycemic drugs: Secondary | ICD-10-CM | POA: Diagnosis not present

## 2015-10-04 DIAGNOSIS — I639 Cerebral infarction, unspecified: Secondary | ICD-10-CM

## 2015-10-04 DIAGNOSIS — F0280 Dementia in other diseases classified elsewhere without behavioral disturbance: Secondary | ICD-10-CM | POA: Diagnosis present

## 2015-10-04 DIAGNOSIS — I69398 Other sequelae of cerebral infarction: Secondary | ICD-10-CM | POA: Diagnosis not present

## 2015-10-04 DIAGNOSIS — E118 Type 2 diabetes mellitus with unspecified complications: Secondary | ICD-10-CM | POA: Insufficient documentation

## 2015-10-04 DIAGNOSIS — I69319 Unspecified symptoms and signs involving cognitive functions following cerebral infarction: Secondary | ICD-10-CM | POA: Diagnosis not present

## 2015-10-04 DIAGNOSIS — E1159 Type 2 diabetes mellitus with other circulatory complications: Secondary | ICD-10-CM | POA: Diagnosis not present

## 2015-10-04 HISTORY — DX: Calculus of kidney: N20.0

## 2015-10-04 HISTORY — DX: Dysphagia, unspecified: R13.10

## 2015-10-04 LAB — I-STAT CHEM 8, ED
BUN: 27 mg/dL — ABNORMAL HIGH (ref 6–20)
CREATININE: 1.3 mg/dL — AB (ref 0.61–1.24)
Calcium, Ion: 1.14 mmol/L (ref 1.13–1.30)
Chloride: 102 mmol/L (ref 101–111)
Glucose, Bld: 144 mg/dL — ABNORMAL HIGH (ref 65–99)
HEMATOCRIT: 43 % (ref 39.0–52.0)
HEMOGLOBIN: 14.6 g/dL (ref 13.0–17.0)
Potassium: 4.1 mmol/L (ref 3.5–5.1)
Sodium: 142 mmol/L (ref 135–145)
TCO2: 32 mmol/L (ref 0–100)

## 2015-10-04 LAB — DIFFERENTIAL
BASOS ABS: 0.1 10*3/uL (ref 0.0–0.1)
BASOS PCT: 1 %
EOS ABS: 0.2 10*3/uL (ref 0.0–0.7)
EOS PCT: 3 %
LYMPHS PCT: 17 %
Lymphs Abs: 1.1 10*3/uL (ref 0.7–4.0)
MONO ABS: 0.5 10*3/uL (ref 0.1–1.0)
Monocytes Relative: 8 %
NEUTROS ABS: 4.5 10*3/uL (ref 1.7–7.7)
NEUTROS PCT: 71 %

## 2015-10-04 LAB — I-STAT TROPONIN, ED: TROPONIN I, POC: 0 ng/mL (ref 0.00–0.08)

## 2015-10-04 LAB — COMPREHENSIVE METABOLIC PANEL
ALT: 13 U/L — ABNORMAL LOW (ref 17–63)
ANION GAP: 13 (ref 5–15)
AST: 33 U/L (ref 15–41)
Albumin: 3.9 g/dL (ref 3.5–5.0)
Alkaline Phosphatase: 66 U/L (ref 38–126)
BILIRUBIN TOTAL: 1 mg/dL (ref 0.3–1.2)
BUN: 21 mg/dL — AB (ref 6–20)
CO2: 29 mmol/L (ref 22–32)
Calcium: 9.7 mg/dL (ref 8.9–10.3)
Chloride: 102 mmol/L (ref 101–111)
Creatinine, Ser: 1.27 mg/dL — ABNORMAL HIGH (ref 0.61–1.24)
GFR, EST AFRICAN AMERICAN: 59 mL/min — AB (ref >60)
GFR, EST NON AFRICAN AMERICAN: 51 mL/min — AB (ref >60)
Glucose, Bld: 152 mg/dL — ABNORMAL HIGH (ref 65–99)
POTASSIUM: 4.2 mmol/L (ref 3.5–5.1)
Sodium: 144 mmol/L (ref 135–145)
TOTAL PROTEIN: 6.8 g/dL (ref 6.5–8.1)

## 2015-10-04 LAB — CBC
HCT: 38.9 % — ABNORMAL LOW (ref 39.0–52.0)
Hemoglobin: 12.8 g/dL — ABNORMAL LOW (ref 13.0–17.0)
MCH: 29.5 pg (ref 26.0–34.0)
MCHC: 32.9 g/dL (ref 30.0–36.0)
MCV: 89.6 fL (ref 78.0–100.0)
PLATELETS: 193 10*3/uL (ref 150–400)
RBC: 4.34 MIL/uL (ref 4.22–5.81)
RDW: 14.9 % (ref 11.5–15.5)
WBC: 6.3 10*3/uL (ref 4.0–10.5)

## 2015-10-04 LAB — PROTIME-INR
INR: 1.02 (ref 0.00–1.49)
PROTHROMBIN TIME: 13.6 s (ref 11.6–15.2)

## 2015-10-04 LAB — APTT: APTT: 31 s (ref 24–37)

## 2015-10-04 LAB — TSH: TSH: 2.225 u[IU]/mL (ref 0.350–4.500)

## 2015-10-04 MED ORDER — ASPIRIN 300 MG RE SUPP: 300.0000 mg | Freq: Every day | RECTAL | Status: DC

## 2015-10-04 MED ORDER — MEMANTINE HCL ER 7 MG PO CP24: 7.0000 mg | ORAL_CAPSULE | Freq: Every day | ORAL | Status: DC

## 2015-10-04 MED ORDER — HYDRALAZINE HCL 20 MG/ML IJ SOLN
5.0000 mg | Freq: Four times a day (QID) | INTRAMUSCULAR | Status: DC | PRN
Start: 2015-10-04 — End: 2015-10-07

## 2015-10-04 MED ORDER — COLCHICINE 0.6 MG PO TABS
0.6000 mg | ORAL_TABLET | Freq: Every day | ORAL | Status: DC
  Administered 2015-10-05 – 2015-10-07 (×3): 0.6 mg via ORAL
  Filled 2015-10-04 (×4): qty 1

## 2015-10-04 MED ORDER — CLOPIDOGREL BISULFATE 75 MG PO TABS
75.0000 mg | ORAL_TABLET | Freq: Every day | ORAL | Status: DC
  Administered 2015-10-05 – 2015-10-07 (×3): 75 mg via ORAL
  Filled 2015-10-04 (×4): qty 1

## 2015-10-04 MED ORDER — LABETALOL HCL 5 MG/ML IV SOLN: 5.0000 mg | Freq: Four times a day (QID) | INTRAVENOUS | Status: DC | PRN

## 2015-10-04 MED ORDER — LABETALOL HCL 5 MG/ML IV SOLN
10.0000 mg | Freq: Once | INTRAVENOUS | Status: AC
  Administered 2015-10-04: 10 mg via INTRAVENOUS
  Filled 2015-10-04: qty 4

## 2015-10-04 MED ORDER — INSULIN ASPART 100 UNIT/ML ~~LOC~~ SOLN
0.0000 [IU] | Freq: Three times a day (TID) | SUBCUTANEOUS | Status: DC
  Administered 2015-10-05: 1 [IU] via SUBCUTANEOUS
  Administered 2015-10-05: 2 [IU] via SUBCUTANEOUS
  Administered 2015-10-06: 5 [IU] via SUBCUTANEOUS
  Administered 2015-10-06: 3 [IU] via SUBCUTANEOUS
  Administered 2015-10-06: 2 [IU] via SUBCUTANEOUS
  Administered 2015-10-07: 3 [IU] via SUBCUTANEOUS
  Administered 2015-10-07: 2 [IU] via SUBCUTANEOUS

## 2015-10-04 MED ORDER — HEPARIN SODIUM (PORCINE) 5000 UNIT/ML IJ SOLN
5000.0000 [IU] | Freq: Three times a day (TID) | INTRAMUSCULAR | Status: DC
Start: 2015-10-04 — End: 2015-10-07
  Administered 2015-10-04 – 2015-10-07 (×9): 5000 [IU] via SUBCUTANEOUS
  Filled 2015-10-04 (×9): qty 1

## 2015-10-04 MED ORDER — ASPIRIN 325 MG PO TABS: 325.0000 mg | ORAL_TABLET | Freq: Every day | ORAL | Status: DC

## 2015-10-04 NOTE — ED Notes (Signed)
Pt arrives from home via [facility]. Pt wife states the pt began having slurred speech, left sided facial droop and left sided weakness this morning at 1100. Upon EMS arrival pt was leaning heavily to the left side, but speech and facial droop had resolved. En route EMS states speech began to worsen.

## 2015-10-04 NOTE — Code Documentation (Signed)
82yo male arriving to [facility] via [facility] at 1437.  EMS reports patient from home with left facial droop, left sided weakness and aphasia.  EMS reports having to assist patient with ambulating and speech that improved but then worsened again.  Of note, patient with BP 260/130 and urinary incontinence.  Code stroke activated by EMS.  Stroke team at the bedside on patient arrival.  Labs drawn and patient cleared by Dr. Lynelle Doctor.  Patient to CT.  CT completed. Dr. Lavon Paganini at the bedside.  Patient to E44.  BP elevated and Labetalol given with improvement.  NIHSS 2, see documentation for details and code stroke times.  Patient with LUE drift and ataxia per Dr. Lavon Paganini.  No acute stroke treatment at this time per MD.  Bedside handoff with ED RN Joni Reining.

## 2015-10-04 NOTE — Consult Note (Signed)
Requesting Physician: Dr.  Laneta Simmers    Reason for consultation: Stroke code.  HPI:                                                                                                                                         Roger [name] is an 82 y.o. male patient who was brought into the emergency room by the EMS for an acute onset of left facial weakness, and left sided limb weakness. Patient has dementia, and is a poor historian. He is unable to specify at one time he was last normal. He started saying that 2 days ago when he went to church, he felt normal but was unable to clarify if he woke up with no symptoms this morning. Patient lives with his wife at home. His wife was not available in the emergency room during my evaluation. We tried to reach his wife on phone which was unanswered. The EMS who brought the patient, they were told that patient's symptoms started around 11 AM although there was no corroborative history available from anyone else. The initial blood pressure recorded by the EMS was systolic of 153.   Date last known well:  Unknown Time last known well:  Unknown tPA Given: No: Unknown time of onset or last normal  Stroke Risk Factors - hypertension  Past Medical History: Past Medical History  Diagnosis Date  . Diabetes ([place])   . Hypertension   . Gout   . ED (erectile dysfunction)   . Heart murmur     Past Surgical History  Procedure Laterality Date  . Tonsillectomy    . Appendectomy    . Lithotripsy    . Uvulopalatopharyngoplasty      for snoring    Family History: Family History  Problem Relation Age of Onset  . Ataxia Neg Hx   . Chorea Neg Hx   . Dementia Neg Hx   . Mental retardation Neg Hx   . Migraines Neg Hx   . Multiple sclerosis Neg Hx   . Neurofibromatosis Neg Hx   . Neuropathy Neg Hx   . Parkinsonism Neg Hx   . Seizures Neg Hx   . Stroke Neg Hx     Social History:   reports that he has never smoked. He does not have any smokeless tobacco  history on file. He reports that he does not drink alcohol or use illicit drugs.  Allergies:  No Known Allergies   Medications:  No current facility-administered medications for this encounter.  Current outpatient prescriptions:  .  Blood Glucose Monitoring Suppl (ONE TOUCH ULTRA 2) W/DEVICE KIT, , Disp: , Rfl:  .  COLCRYS 0.6 MG tablet, Take 0.6 mg by mouth daily. , Disp: , Rfl:  .  losartan (COZAAR) 100 MG tablet, Take 100 mg by mouth daily. , Disp: , Rfl:  .  Memantine HCl ER (NAMENDA XR) 7 MG CP24, Start 1 tab qdx7d, then 2tab qdx7d, then 3tab qdx7d.  Call for refill, Disp: 42 capsule, Rfl: 0 .  metFORMIN (GLUCOPHAGE-XR) 500 MG 24 hr tablet, Take 500 mg by mouth daily with breakfast. , Disp: , Rfl:  .  Multiple Vitamin (MULTIVITAMIN) tablet, Take 1 tablet by mouth daily., Disp: , Rfl:  .  Red Yeast Rice Extract (RED YEAST RICE PO), Take by mouth daily., Disp: , Rfl:  .  Specialty Vitamins Products (PROSTATE PO), Take by mouth daily., Disp: , Rfl:    ROS:                                                                                                                                       History  unobtainable from patient due to age and Dementia   Neurologic Examination:                                                                                                      Blood pressure 176/85, pulse 70, resp. rate 17, SpO2 97 %.  Evaluation of higher integrative functions including: Level of alertness: Alert,  Oriented to place and person, not to month or year Speech: fluent, no evidence of dysarthria or aphasia noted.  Test the following cranial nerves: 2-12 grossly intact Motor examination: Normal tone, bulk, minimal left upper extremity weakness and pronator drift noted, otherwise full 5/5 motor strength in left lower extremity, and right upper and lower  extremities  Examination of sensation : Reports symmetric sensation to pinprick in all 4 extremities and on face Examination of deep tendon reflexes: 2+, normal and symmetric in all extremities, normal plantars bilaterally Test coordination: Minimal dysmetria with the left finger nose testing, normal on the right , no abnormal involuntary movements or tremors noted.  Gait: Deferred   Lab Results: Basic Metabolic Panel:  Recent Labs Lab 10/04/15 1455  NA 142  K 4.1  CL 102  GLUCOSE 144*  BUN 27*  CREATININE 1.30*    Liver Function Tests: No results for input(s): AST, ALT, ALKPHOS, BILITOT, PROT,  ALBUMIN in the last 168 hours. No results for input(s): LIPASE, AMYLASE in the last 168 hours. No results for input(s): AMMONIA in the last 168 hours.  CBC:  Recent Labs Lab 10/04/15 1454 10/04/15 1455  WBC 6.3  --   NEUTROABS 4.5  --   HGB 12.8* 14.6  HCT 38.9* 43.0  MCV 89.6  --   PLT 193  --     Cardiac Enzymes: No results for input(s): CKTOTAL, CKMB, CKMBINDEX, TROPONINI in the last 168 hours.  Lipid Panel: No results for input(s): CHOL, TRIG, HDL, CHOLHDL, VLDL, LDLCALC in the last 168 hours.  CBG: No results for input(s): GLUCAP in the last 168 hours.  Microbiology: No results found for this or any previous visit.   Imaging: Ct Head Wo Contrast  10/04/2015  ADDENDUM REPORT: 10/04/2015 15:18 ADDENDUM: Return call at 3:18 p.m. with direct communication with Dr. Silverio Decamp. Electronically Signed   By: Abigail Miyamoto M.D.   On: 10/04/2015 15:18  10/04/2015  CLINICAL DATA:  Slurred speech. Left-sided facial droop. Hypertension and diabetes. EXAM: CT HEAD WITHOUT CONTRAST TECHNIQUE: Contiguous axial images were obtained from the base of the skull through the vertex without intravenous contrast. COMPARISON:  MRI of 03/12/2014. FINDINGS: Sinuses/Soft tissues: Surgical changes about both globes. Minimal mucosal thickening of ethmoid air cells. Hypoplastic frontal sinuses. Clear  mastoid air cells. Intracranial: Expected cerebral volume loss for age. Mild for age low density in the periventricular white matter likely related to small vessel disease. Bilateral vertebral and carotid intracranial atherosclerosis. No mass lesion, hemorrhage, hydrocephalus, acute infarct, intra-axial, or extra-axial fluid collection. IMPRESSION: No acute intracranial abnormality. The referring physician was paged at 3 p.m. and a return call is pending. Electronically Signed: By: Abigail Miyamoto M.D. On: 10/04/2015 15:01    Assessment and plan:   Roger [name] is an 82 y.o. male patient who was brought into the emergency room with symptoms of left facial weakness and left sided limb weakness per EMS. The last normal was unknown and difficult to determine as patient has dementia and was unable to provide any clear history. His wife was not reachable on phone,  was not available at bedside in the ER. His neurological examination is significant for left pronator drift, with minimal left upper extremity weakness and minimal left arm dysmetria, with a NIH score of 2. His initial systolic blood pressures measured by EMS in field of 260. There were 220 in the ER, improved to 170s after 10 mg of labetalol. Due to rapidly improving symptoms with only minimal residual deficit noted during my evaluation, and inability to determine precise time of onset, he is not considered a candidate for iv TPA.  Patient does have significant vascular risk factors for an acute stroke with severely elevated pressure and prior chronic lacunar infarcts seen on a prior brain MRI. I suspect that his symptoms could be secondary to an acute right basal ganglia lacunar infarct.  He'll be admitted to the hospitalist service for further monitoring and stroke workup. Recommend MRI of the brain without contrast, MRA of the head, ultrasound of the carotids, echocardiogram and will be placed on telemetry. Start Plavix 75 mg daily for second stroke  prevention. Check A1c and lipid profile. Physical therapy for gait and balance training. Stroke team will continue to follow starting tomorrow morning. Please call for any further questions.

## 2015-10-04 NOTE — Progress Notes (Signed)
Pt arrived on unit from ED. Pt alert and oriented to unit. Pt educated on call button. Vitals taken.

## 2015-10-04 NOTE — H&P (Signed)
[facility] History and Physical  Roger [name] IOE:[id number] DOB: [birth year] DOA: 10/04/2015  Referring physician:  PCP: Catarina Hartshorn, MD   Chief Complaint: slurred speech  HPI: Roger [name] is a 82 y.o. male history of hypertension, diabetes mellitus, Alzheimer's dementia,  presenting with new onset slurred speech, left facial droop and left sided weakness. He was leaning heavily on the left side. Symptoms began around 11 this morning. No previous  history of TIA or strokwe. CT head negative. MRI brain pending.  Speech has improved but not resolved. No dizziness.He was last known well before bed last night. Patient was not administered TPA  As symptoms are beyond time window for treatment consideration.   Denies any headaches or vision changes. Denies any nausea or vomiting. Denies any worsening deficits in memory. He has chronic dysphagia, but was able to pass swallow eval today. Denies any chest pain or shortness of breath. Denies any abdominal pain. Denies any leg swelling. Denies any recent long distance trips. No hormonal replacement. He does not take an aspirin a day.He is diabetic. He denies any changes in medications lately and he is compliant with his meds. He does not smoke. His blood pressure on arrival was 260/130. Currently is 199/81 after labetalol dose. Neuro workup  Review of Systems:  Constitutional:  No weight loss, night sweats, fevers, chills, fatigue HEENT: No headaches, difficulty swallowing,tooth/dental problems, sore throat Cardio-vascular:  No chest pain, orthopnea, PND, swelling in lower extremities, anasarca, dizziness, palpitations  GI:  No heartburn, indigestion, abdominal pain, nausea, vomiting, diarrhea, change in bowel habits, loss of appetite  Respiratory:  No shortness of breath with exertion or at rest. No excess mucus, no productive cough, No non-productive cough, No coughing up of blood. No change in color of mucus. No wheezing .No chest wall  deformity  Skin:  No rash or lesions.  GU:  no dysuria, change in color of urine, no urgency or frequency. No flank pain.  Musculoskeletal:   No joint pain or swelling. No decreased range of motion. No back pain.  Psych:  No change in mood or affect. No depression or anxiety. + memory loss due to Alzheimer's.  Neuro: slurred speech, left facial droop. He was leaning heavily on the left side on admission, not with minimal dysarthria No change in sensation, he has unilateral strength, or cognitive abilities  All other systems were reviewed and are negative.  Past Medical History  Diagnosis Date  . Diabetes ([place])   . Hypertension   . Gout   . ED (erectile dysfunction)   . Heart murmur   . Kidney stones   . Dysphagia    Past Surgical History  Procedure Laterality Date  . Tonsillectomy    . Appendectomy    . Lithotripsy    . Uvulopalatopharyngoplasty      for snoring   Social History:  reports that he has never smoked. He does not have any smokeless tobacco history on file. He reports that he does not drink alcohol or use illicit drugs.  No Known Allergies  Family History  Problem Relation Age of Onset  . Ataxia Neg Hx   . Chorea Neg Hx   . Dementia Neg Hx   . Mental retardation Neg Hx   . Migraines Neg Hx   . Multiple sclerosis Neg Hx   . Neurofibromatosis Neg Hx   . Neuropathy Neg Hx   . Parkinsonism Neg Hx   . Seizures Neg Hx   . Stroke Neg Hx  Prior to Admission medications   Medication Sig Start Date End Date Taking? Authorizing Provider  Blood Glucose Monitoring Suppl (ONE TOUCH ULTRA 2) W/DEVICE KIT  08/01/13   Historical Provider, MD  COLCRYS 0.6 MG tablet Take 0.6 mg by mouth daily.  07/05/13   Historical Provider, MD  losartan (COZAAR) 100 MG tablet Take 100 mg by mouth daily.  08/12/13   Historical Provider, MD  Memantine HCl ER (NAMENDA XR) 7 MG CP24 Start 1 tab qdx7d, then 2tab qdx7d, then 3tab qdx7d.  Call for refill 02/20/14   Pieter Partridge, DO    metFORMIN (GLUCOPHAGE-XR) 500 MG 24 hr tablet Take 500 mg by mouth daily with breakfast.  07/08/13   Historical Provider, MD  Multiple Vitamin (MULTIVITAMIN) tablet Take 1 tablet by mouth daily.    Historical Provider, MD  Red Yeast Rice Extract (RED YEAST RICE PO) Take by mouth daily.    Historical Provider, MD  Specialty Vitamins Products (PROSTATE PO) Take by mouth daily.    Historical Provider, MD   Physical Exam: Filed Vitals:   10/04/15 1515 10/04/15 1545 10/04/15 1600 10/04/15 1615  BP: 176/85 182/88 182/94 199/81  Pulse: 70 74 75 74  Resp: _0 SpO2: 97% 98% 96% 98%    Wt Readings from Last 3 Encounters:  02/20/14 63.957 kg (141 lb)  02/13/14 63.05 kg (139 lb)  08/14/13 66.48 kg (146 lb 9 oz)    General: Appears calm and comfortable Eyes:  PERRL, EOMI, normal lids, iris ENT: grossly normal hearing, lips & tongue Neck: no lymphadenopathy, masses or thyromegaly Cardiovascular: regular rate and rythm, soft murmurs, rubs or gallops. No lower extremity edema   Respiratory: clear to auscultation bilaterally, no wheezing, rhonhci or rales. Normal respiratory effort. Abdomen: soft,non-tender, normal bowel sounds Skin: no rash or induration seen on limited exam. No open lesions. Musculoskeletal:  grossly normal tone in both upper and lower extremities Psychiatric: grossly normal mood and affect, speech fluent and appropriate Neurologic: Left sided weakness, 2/5, left facial droop Sensation intact.           Labs on Admission:  Basic Metabolic Panel:  Recent Labs Lab 10/04/15 1454 10/04/15 1455  NA 144 142  K 4.2 4.1  CL 102 102  CO2 29  --   GLUCOSE 152* 144*  BUN 21* 27*  CREATININE 1.27* 1.30*  CALCIUM 9.7  --     Liver Function Tests:  Recent Labs Lab 10/04/15 1454  AST 33  ALT 13*  ALKPHOS 66  BILITOT 1.0  PROT 6.8  ALBUMIN 3.9   No results for input(s): LIPASE, AMYLASE in the last 168 hours. No results for input(s): AMMONIA in the last 168  hours.  CBC:  Recent Labs Lab 10/04/15 1454 10/04/15 1455  WBC 6.3  --   NEUTROABS 4.5  --   HGB 12.8* 14.6  HCT 38.9* 43.0  MCV 89.6  --   PLT 193  --     Radiological Exams on Admission: Ct Head Wo Contrast  10/04/2015  ADDENDUM REPORT: 10/04/2015 15:18 ADDENDUM: Return call at 3:18 p.m. with direct communication with Dr. Silverio Decamp. Electronically Signed   By: Abigail Miyamoto M.D.   On: 10/04/2015 15:18  10/04/2015  CLINICAL DATA:  Slurred speech. Left-sided facial droop. Hypertension and diabetes. EXAM: CT HEAD WITHOUT CONTRAST TECHNIQUE: Contiguous axial images were obtained from the base of the skull through the vertex without intravenous contrast. COMPARISON:  MRI of 03/12/2014. FINDINGS: Sinuses/Soft tissues: Surgical changes about  both globes. Minimal mucosal thickening of ethmoid air cells. Hypoplastic frontal sinuses. Clear mastoid air cells. Intracranial: Expected cerebral volume loss for age. Mild for age low density in the periventricular white matter likely related to small vessel disease. Bilateral vertebral and carotid intracranial atherosclerosis. No mass lesion, hemorrhage, hydrocephalus, acute infarct, intra-axial, or extra-axial fluid collection. IMPRESSION: No acute intracranial abnormality. The referring physician was paged at 3 p.m. and a return call is pending. Electronically Signed: By: Abigail Miyamoto M.D. On: 10/04/2015 15:01      Assessment/Plan Principal Problem:   Slurred speech Active Problems:   Malignant hypertension   Diabetes ([facility])  Acute stroke like symptoms  with dysarthria and left facial drooping now almost resolved, left sided weakness, and chronic dysphagia Not a TPA candidate as her last known normal was the night prior to hospitalization. 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 On presentation  at 260/130, currently  At 190/81 after 1 dose of labetalol.  EKG normal SR with QTC 479 Patient was compliant with his medications.   Admit to tele obs Continue Hydralazine for BP >210 syst, >110 diast Resume home BP meds as per Neuro TSH Check serial Troponin Consider workup for secondary hypertension as outpatient  Type II Diabetes Home regimen includes Last A1C on was Current blood sugar level is 144 No results found for: HGBA1C Hgb A1C  Hold home oral diabetic medications.  ISS NPO for now   Physical Deconditioning Patient unable to ambulate without assistance at this time due to left sided weakness and dysarthria Will need PT/OT/SLP as above Social Work consult for placement, SNF versus Inpatient Rehab Nutrition Consult   Code Status: Full Code  DVT Prophylaxis: Heparin Family Communication: at bedside Disposition Plan: Pending Improvement. Admitted for observation in tele bed. Expected LOS 24-48 hrs    [facility] E,PA-C [facility] [email or web address] Password [email or web address]

## 2015-10-04 NOTE — ED Provider Notes (Signed)
CSN: [id number]     Arrival date & time 10/04/15  1437 History   First MD Initiated Contact with Patient 10/04/15 1441     Chief Complaint  Patient presents with  . Code Stroke    An emergency department physician performed an initial assessment on this suspected stroke patient at 1437. (Consider location/radiation/quality/duration/timing/severity/associated sxs/prior Treatment) Patient is a 82 y.o. male presenting with Acute Neurological Problem. The history is provided by the spouse, the patient and the EMS personnel.  Cerebrovascular Accident This is a new problem. The current episode started 3 to 5 hours ago. The problem occurs rarely. The problem has been gradually improving. Pertinent negatives include no chest pain, no abdominal pain, no headaches and no shortness of breath. Nothing aggravates the symptoms. Nothing relieves the symptoms. He has tried nothing for the symptoms.    Past Medical History  Diagnosis Date  . Diabetes ([place])   . Hypertension   . Gout   . ED (erectile dysfunction)   . Heart murmur    Past Surgical History  Procedure Laterality Date  . Tonsillectomy    . Appendectomy    . Lithotripsy    . Uvulopalatopharyngoplasty      for snoring   Family History  Problem Relation Age of Onset  . Ataxia Neg Hx   . Chorea Neg Hx   . Dementia Neg Hx   . Mental retardation Neg Hx   . Migraines Neg Hx   . Multiple sclerosis Neg Hx   . Neurofibromatosis Neg Hx   . Neuropathy Neg Hx   . Parkinsonism Neg Hx   . Seizures Neg Hx   . Stroke Neg Hx    Social History  Substance Use Topics  . Smoking status: Never Smoker   . Smokeless tobacco: None  . Alcohol Use: No    Review of Systems  Respiratory: Negative for shortness of breath.   Cardiovascular: Negative for chest pain.  Gastrointestinal: Negative for abdominal pain.  Neurological: Positive for facial asymmetry (resolved), speech difficulty (slurring) and weakness (on left). Negative for headaches.   All other systems reviewed and are negative.     Allergies  Review of patient's allergies indicates no known allergies.  Home Medications   Prior to Admission medications   Medication Sig Start Date End Date Taking? Authorizing Provider  Blood Glucose Monitoring Suppl (ONE TOUCH ULTRA 2) W/DEVICE KIT  08/01/13   Historical Provider, MD  COLCRYS 0.6 MG tablet Take 0.6 mg by mouth daily.  07/05/13   Historical Provider, MD  losartan (COZAAR) 100 MG tablet Take 100 mg by mouth daily.  08/12/13   Historical Provider, MD  Memantine HCl ER (NAMENDA XR) 7 MG CP24 Start 1 tab qdx7d, then 2tab qdx7d, then 3tab qdx7d.  Call for refill 02/20/14   Pieter Partridge, DO  metFORMIN (GLUCOPHAGE-XR) 500 MG 24 hr tablet Take 500 mg by mouth daily with breakfast.  07/08/13   Historical Provider, MD  Multiple Vitamin (MULTIVITAMIN) tablet Take 1 tablet by mouth daily.    Historical Provider, MD  Red Yeast Rice Extract (RED YEAST RICE PO) Take by mouth daily.    Historical Provider, MD  Specialty Vitamins Products (PROSTATE PO) Take by mouth daily.    Historical Provider, MD   BP 159/82 mmHg  Pulse 68  Resp 18  SpO2 98% Physical Exam  Constitutional: He is oriented to person, place, and time. He appears well-developed and well-nourished. No distress.  HENT:  Head: Normocephalic and atraumatic.  Eyes:  Conjunctivae are normal.  Neck: Neck supple. No tracheal deviation present.  Cardiovascular: Normal rate and regular rhythm.   Pulmonary/Chest: Effort normal. No respiratory distress.  Abdominal: Soft. He exhibits no distension.  Neurological: He is alert and oriented to person, place, and time. He has normal strength. He displays no tremor. No cranial nerve deficit. Coordination (mild apraxia) abnormal. GCS eye subscore is 4. GCS verbal subscore is 5. GCS motor subscore is 6.  Speech slurred slightly, difficulty fixing gaze  Skin: Skin is warm and dry.  Psychiatric: He has a normal mood and affect.    ED  Course  Procedures (including critical care time) Labs Review Labs Reviewed  CBC - Abnormal; Notable for the following:    Hemoglobin 12.8 (*)    HCT 38.9 (*)    All other components within normal limits  COMPREHENSIVE METABOLIC PANEL - Abnormal; Notable for the following:    Glucose, Bld 152 (*)    BUN 21 (*)    Creatinine, Ser 1.27 (*)    ALT 13 (*)    GFR calc non Af Amer 51 (*)    GFR calc Af Amer 59 (*)    All other components within normal limits  I-STAT CHEM 8, ED - Abnormal; Notable for the following:    BUN 27 (*)    Creatinine, Ser 1.30 (*)    Glucose, Bld 144 (*)    All other components within normal limits  PROTIME-INR  APTT  DIFFERENTIAL  I-STAT TROPOININ, ED  CBG MONITORING, ED    Imaging Review Ct Head Wo Contrast  10/04/2015  ADDENDUM REPORT: 10/04/2015 15:18 ADDENDUM: Return call at 3:18 p.m. with direct communication with Dr. Silverio Decamp. Electronically Signed   By: Abigail Miyamoto M.D.   On: 10/04/2015 15:18  10/04/2015  CLINICAL DATA:  Slurred speech. Left-sided facial droop. Hypertension and diabetes. EXAM: CT HEAD WITHOUT CONTRAST TECHNIQUE: Contiguous axial images were obtained from the base of the skull through the vertex without intravenous contrast. COMPARISON:  MRI of 03/12/2014. FINDINGS: Sinuses/Soft tissues: Surgical changes about both globes. Minimal mucosal thickening of ethmoid air cells. Hypoplastic frontal sinuses. Clear mastoid air cells. Intracranial: Expected cerebral volume loss for age. Mild for age low density in the periventricular white matter likely related to small vessel disease. Bilateral vertebral and carotid intracranial atherosclerosis. No mass lesion, hemorrhage, hydrocephalus, acute infarct, intra-axial, or extra-axial fluid collection. IMPRESSION: No acute intracranial abnormality. The referring physician was paged at 3 p.m. and a return call is pending. Electronically Signed: By: Abigail Miyamoto M.D. On: 10/04/2015 15:01   I have  personally reviewed and evaluated these images and lab results as part of my medical decision-making.   EKG Interpretation   Date/Time:  Monday October 04 2015 14:54:58 EST Ventricular Rate:  82 PR Interval:  154 QRS Duration: 96 QT Interval:  409 QTC Calculation: 478 R Axis:   -8 Text Interpretation:  Sinus rhythm Left atrial enlargement Left  ventricular hypertrophy Anterior infarct, old Baseline wander in lead(s)  II aVR aVF No significant change since last tracing Confirmed by Esmeralda Malay MD,  Quillian Quince ([id number]) on 10/04/2015 2:58:29 PM      MDM   Final diagnoses:  Slurred speech  Left-sided weakness  Dizziness    82 y.o. male presents with acute onset of left sided weakness, facial asymmetry and slurred speech starting at 11am. Weakness improved, speech problem persisted, nonfocal exam on arrival. Neurology involved for code stroke but tPA not indicated with minimal deficit and outside of window  for intervention. Neurology recommending admission for observation to complete workup including MR brain. Hospitalist was consulted for admission and will see the patient in the emergency department.     Leo Grosser, MD 10/04/15 [phone]

## 2015-10-05 ENCOUNTER — Inpatient Hospital Stay (HOSPITAL_COMMUNITY): Payer: Medicare Other

## 2015-10-05 ENCOUNTER — Other Ambulatory Visit (HOSPITAL_COMMUNITY): Payer: Medicare Other

## 2015-10-05 ENCOUNTER — Ambulatory Visit (HOSPITAL_COMMUNITY): Payer: Medicare Other

## 2015-10-05 DIAGNOSIS — E785 Hyperlipidemia, unspecified: Secondary | ICD-10-CM

## 2015-10-05 DIAGNOSIS — I63312 Cerebral infarction due to thrombosis of left middle cerebral artery: Secondary | ICD-10-CM

## 2015-10-05 LAB — LIPID PANEL
Cholesterol: 196 mg/dL (ref 0–200)
HDL: 45 mg/dL (ref >40)
LDL CALC: 125 mg/dL — AB (ref 0–99)
TRIGLYCERIDES: 130 mg/dL (ref <150)
Total CHOL/HDL Ratio: 4.4 RATIO
VLDL: 26 mg/dL (ref 0–40)

## 2015-10-05 LAB — VITAMIN B12: VITAMIN B 12: 574 pg/mL (ref 180–914)

## 2015-10-05 LAB — GLUCOSE, CAPILLARY
Glucose-Capillary: 145 mg/dL — ABNORMAL HIGH (ref 65–99)
Glucose-Capillary: 172 mg/dL — ABNORMAL HIGH (ref 65–99)
Glucose-Capillary: 214 mg/dL — ABNORMAL HIGH (ref 65–99)

## 2015-10-05 MED ORDER — IOHEXOL 350 MG/ML SOLN
50.0000 mL | Freq: Once | INTRAVENOUS | Status: AC | PRN
  Administered 2015-10-05: 50 mL via INTRAVENOUS

## 2015-10-05 MED ORDER — ATORVASTATIN CALCIUM 10 MG PO TABS
20.0000 mg | ORAL_TABLET | Freq: Every day | ORAL | Status: DC
  Administered 2015-10-06: 20 mg via ORAL
  Filled 2015-10-05 (×2): qty 2

## 2015-10-05 NOTE — Progress Notes (Signed)
[facility] PROGRESS NOTE  Khalique Borg WER:[id number] DOB: Feb 08, 1931 DOA: 10/04/2015 PCP: No primary care provider on file.  Assessment/Plan: 1. Stroke- MRI brain shows basal ganglia stroke. Stroke workup underway. Aspirin changed to Plavix 75 mg by mouth daily. Neurology following. 2. Hypertension- blood pressure elevated, and hypertensive medications on hold due to permissive hypertension. 3. Diabetes mellitus- continue to hold metformin, sliding scale insulin with NovoLog. 4. DVT prophylaxis- Lovenox  Code Status: Full code Family Communication: Discussed with patient's daughter at bedside Disposition Plan: Pending workup for stroke   Consultants:  Neurology  Procedures:  Echo-ordered  Carotid duplex- ordered  Antibiotics:  None  HPI/Subjective: 82 y.o. male history of hypertension, diabetes mellitus, Alzheimer's dementia, presenting with new onset slurred speech, left facial droop and left sided weakness. He was leaning heavily on the left side. Symptoms began around 11 this morning. No previous history of TIA or strokwe. CT head negative. MRI brain pending. Speech has improved but not resolved. No dizziness.He was last known well before bed last night. Patient was not administered TPA , symptoms beyond time window for treatment consideration.   Patient continues to have left-sided weakness.   Objective: Filed Vitals:   10/05/15 0954 10/05/15 1327  BP: 143/90 171/87  Pulse: 95 89  Temp: 98.6 F (37 C) 98.2 F (36.8 C)  Resp: 18 20    Intake/Output Summary (Last 24 hours) at 10/05/15 1653 Last data filed at 10/05/15 0724  Gross per 24 hour  Intake      0 ml  Output    500 ml  Net   -500 ml   There were no vitals filed for this visit.  Exam:   General:  Appears in no acute distress  Cardiovascular: S1-S2 normal, regular rhythm  Respiratory: Clear to auscultation bilaterally, no wheezing or crackles  Abdomen: Soft, nontender, no  organomegaly  Musculoskeletal: No cyanosis/clubbing/edema of the lower extremities   Data Reviewed: Basic Metabolic Panel:  Recent Labs Lab 10/04/15 1454 10/04/15 1455  NA 144 142  K 4.2 4.1  CL 102 102  CO2 29  --   GLUCOSE 152* 144*  BUN 21* 27*  CREATININE 1.27* 1.30*  CALCIUM 9.7  --    Liver Function Tests:  Recent Labs Lab 10/04/15 1454  AST 33  ALT 13*  ALKPHOS 66  BILITOT 1.0  PROT 6.8  ALBUMIN 3.9   CBC:  Recent Labs Lab 10/04/15 1454 10/04/15 1455  WBC 6.3  --   NEUTROABS 4.5  --   HGB 12.8* 14.6  HCT 38.9* 43.0  MCV 89.6  --   PLT 193  --    CBG:  Recent Labs Lab 10/05/15 0658  GLUCAP 145*    No results found for this or any previous visit (from the past 240 hour(s)).   Studies: Ct Angio Head W/cm &/or Wo Cm  10/05/2015  CLINICAL DATA:  Stroke. EXAM: CT ANGIOGRAPHY HEAD AND NECK TECHNIQUE: Multidetector CT imaging of the head and neck was performed using the standard protocol during bolus administration of intravenous contrast. Multiplanar CT image reconstructions and MIPs were obtained to evaluate the vascular anatomy. Carotid stenosis measurements (when applicable) are obtained utilizing NASCET criteria, using the distal internal carotid diameter as the denominator. CONTRAST:  60mL OMNIPAQUE IOHEXOL 350 MG/ML SOLN COMPARISON:  Head CT from yesterday FINDINGS: CTA NECK Aortic arch: No aneurysm or dissection. 2 vessel branching. Extensive atherosclerotic plaque mixed area Right carotid system: Diffuse atheromatous wall thickening. No flow limiting (50% or greater) stenosis.  No dissection. Left carotid system:Diffuse atheromatous wall thickening. No flow limiting (50% or greater) stenosis. Vertebral arteries: No dissection. No proximal subclavian flow limiting stenosis. Left vertebral artery dominance. No evidence of dissection. High-grade right ostial stenosis. Skeleton: No contributory findings Other neck: Patchy ground-glass and nodular opacities  in the right upper lobe with a infectious/ inflammatory appearance CTA HEAD Anterior circulation: Symmetric carotid arteries. Hypoplastic right A1 segment with anterior communicating artery. Bilateral posterior communicating arteries. No proximal occlusion or flow limiting stenosis. Atherosclerotic calcification on the carotid siphons. High-grade distal left M2 branch stenosis best seen on coronal reformats image 106. Elsewhere atherosclerotic irregularity and narrowing is mild to moderate. Posterior circulation: Left dominant. Early branching right PICA. The right V4 segment diffuse atherosclerotic type irregularity, with minimal if any contribution to the basilar. These narrowings are beyond the PICA origin. Atherosclerotic irregularity of the basilar which is narrow in the setting of sizable posterior communicating arteries. No major branch occlusion. Congenitally small left P1 segment with superimposed atherosclerotic irregularity. Best seen on coronal reformats there is moderate stenosis of right P4 branch. Negative for aneurysm. Venous sinuses: Patent Anatomic variants: Intact circle-of-Willis Delayed phase: Not performed IMPRESSION: 1. No acute arterial finding. 2. Cervical and vertebral artery atherosclerosis. High-grade non dominant right vertebral ostium and V4 stenoses. 3. Intracranial atherosclerosis as described. 4. Patchy opacity in the right lung with inflammatory appearance. Correlate for symptoms of active pneumonitis/pneumonia. Electronically Signed   By: Marnee Spring M.D.   On: 10/05/2015 11:31   Dg Chest 2 View  10/04/2015  CLINICAL DATA:  Stroke symptoms since 1100 hrs today, hx diabetic, hx hypertension EXAM: CHEST  2 VIEW COMPARISON:  08/12/2015 FINDINGS: Cardiac silhouette normal in size and configuration. No mediastinal or hilar masses or evidence of adenopathy. Previously seen airspace opacities have resolved. Lungs are mildly hyperexpanded. There are mildly prominent bronchovascular  markings which are stable. No evidence of pneumonia or edema. No pleural effusion or pneumothorax. Bony thorax is demineralized but grossly intact. IMPRESSION: No acute cardiopulmonary disease. Electronically Signed   By: Amie Portland M.D.   On: 10/04/2015 17:58   Ct Head Wo Contrast  10/04/2015  ADDENDUM REPORT: 10/04/2015 15:18 ADDENDUM: Return call at 3:18 p.m. with direct communication with Dr. Lavon Paganini. Electronically Signed   By: Jeronimo Greaves M.D.   On: 10/04/2015 15:18  10/04/2015  CLINICAL DATA:  Slurred speech. Left-sided facial droop. Hypertension and diabetes. EXAM: CT HEAD WITHOUT CONTRAST TECHNIQUE: Contiguous axial images were obtained from the base of the skull through the vertex without intravenous contrast. COMPARISON:  MRI of 03/12/2014. FINDINGS: Sinuses/Soft tissues: Surgical changes about both globes. Minimal mucosal thickening of ethmoid air cells. Hypoplastic frontal sinuses. Clear mastoid air cells. Intracranial: Expected cerebral volume loss for age. Mild for age low density in the periventricular white matter likely related to small vessel disease. Bilateral vertebral and carotid intracranial atherosclerosis. No mass lesion, hemorrhage, hydrocephalus, acute infarct, intra-axial, or extra-axial fluid collection. IMPRESSION: No acute intracranial abnormality. The referring physician was paged at 3 p.m. and a return call is pending. Electronically Signed: By: Jeronimo Greaves M.D. On: 10/04/2015 15:01   Ct Angio Neck W/cm &/or Wo/cm  10/05/2015  CLINICAL DATA:  Stroke. EXAM: CT ANGIOGRAPHY HEAD AND NECK TECHNIQUE: Multidetector CT imaging of the head and neck was performed using the standard protocol during bolus administration of intravenous contrast. Multiplanar CT image reconstructions and MIPs were obtained to evaluate the vascular anatomy. Carotid stenosis measurements (when applicable) are obtained utilizing NASCET criteria, using the  distal internal carotid diameter as the  denominator. CONTRAST:  50mL OMNIPAQUE IOHEXOL 350 MG/ML SOLN COMPARISON:  Head CT from yesterday FINDINGS: CTA NECK Aortic arch: No aneurysm or dissection. 2 vessel branching. Extensive atherosclerotic plaque mixed area Right carotid system: Diffuse atheromatous wall thickening. No flow limiting (50% or greater) stenosis. No dissection. Left carotid system:Diffuse atheromatous wall thickening. No flow limiting (50% or greater) stenosis. Vertebral arteries: No dissection. No proximal subclavian flow limiting stenosis. Left vertebral artery dominance. No evidence of dissection. High-grade right ostial stenosis. Skeleton: No contributory findings Other neck: Patchy ground-glass and nodular opacities in the right upper lobe with a infectious/ inflammatory appearance CTA HEAD Anterior circulation: Symmetric carotid arteries. Hypoplastic right A1 segment with anterior communicating artery. Bilateral posterior communicating arteries. No proximal occlusion or flow limiting stenosis. Atherosclerotic calcification on the carotid siphons. High-grade distal left M2 branch stenosis best seen on coronal reformats image 106. Elsewhere atherosclerotic irregularity and narrowing is mild to moderate. Posterior circulation: Left dominant. Early branching right PICA. The right V4 segment diffuse atherosclerotic type irregularity, with minimal if any contribution to the basilar. These narrowings are beyond the PICA origin. Atherosclerotic irregularity of the basilar which is narrow in the setting of sizable posterior communicating arteries. No major branch occlusion. Congenitally small left P1 segment with superimposed atherosclerotic irregularity. Best seen on coronal reformats there is moderate stenosis of right P4 branch. Negative for aneurysm. Venous sinuses: Patent Anatomic variants: Intact circle-of-Willis Delayed phase: Not performed IMPRESSION: 1. No acute arterial finding. 2. Cervical and vertebral artery atherosclerosis.  High-grade non dominant right vertebral ostium and V4 stenoses. 3. Intracranial atherosclerosis as described. 4. Patchy opacity in the right lung with inflammatory appearance. Correlate for symptoms of active pneumonitis/pneumonia. Electronically Signed   By: Marnee Spring M.D.   On: 10/05/2015 11:31   Mr Brain Wo Contrast  10/05/2015  CLINICAL DATA:  82 year old hypertensive diabetic male with facial asymmetry, slurred speech and left-sided weakness. Subsequent encounter. EXAM: MRI HEAD WITHOUT CONTRAST TECHNIQUE: Multiplanar, multiecho pulse sequences of the brain and surrounding structures were obtained without intravenous contrast. COMPARISON:  CT angiogram 10/05/2015. Head CT 10/04/2015. Brain MR 03/12/2014. FINDINGS: Exam is motion degraded. Moderate-size acute nonhemorrhagic infarct extends from the posterior right lenticular nucleus extending to posterior right corona radiata. Tiny area blood breakdown products posterior left frontal lobe most consistent with result prior hemorrhagic ischemia. Remote small cerebellar infarcts bilaterally, right paracentral pontine infarct, basal ganglia infarcts, left thalamic infarct and small infarcts centrum semi ovale greater on the right. Moderate chronic microvascular changes. Global moderate atrophy without hydrocephalus. No intracranial mass lesion noted on this unenhanced exam. Abnormal appearance right vertebral artery. Please see CT angiogram report. Post lens replacement. Cervical medullary junction and pineal region unremarkable. IMPRESSION: Moderate-size acute nonhemorrhagic infarct extends from the posterior right lenticular nucleus extending to posterior right corona radiata. Remote small cerebellar infarcts bilaterally, right paracentral pontine infarct, basal ganglia infarcts, left thalamic infarct and small infarcts centrum semi ovale greater on the right. Moderate chronic microvascular changes. Global moderate atrophy without hydrocephalus. Abnormal  appearance right vertebral artery. Please see CT angiogram report. Electronically Signed   By: Lacy Duverney M.D.   On: 10/05/2015 13:33   Dg Swallowing Func-speech Pathology  10/05/2015  Objective Swallowing Evaluation: Type of Study: MBS-Modified Barium Swallow Study Patient Details Name: Adonijah Baena MRN: [id number] Date of Birth: 07/16/1931 Today's Date: 10/05/2015 Time: SLP Start Time (ACUTE ONLY): 1056-SLP Stop Time (ACUTE ONLY): 1119 SLP Time Calculation (min) (ACUTE ONLY): 23 min  Past Medical History: Past Medical History Diagnosis Date . Diabetes (HCC)  . Hypertension  . Gout  . ED (erectile dysfunction)  . Heart murmur  . Kidney stones  . Dysphagia  Past Surgical History: Past Surgical History Procedure Laterality Date . Tonsillectomy   . Appendectomy   . Lithotripsy   . Uvulopalatopharyngoplasty     for snoring HPI: Pt is an 82 y/o male who presents with slurred speech, L facial droop and L sided weakness. CT was negative for acute changes, and MRI is currently pending.  Subjective: patient upright in bed with daughter at bedside Assessment / Plan / Recommendation CHL IP CLINICAL IMPRESSIONS 10/05/2015 Therapy Diagnosis Severe oral phase dysphagia;Moderate pharyngeal phase dysphagia;Other (Comment) Clinical Impression Patient presents with baseline mild dysphagia, which is now compounded by new onset of left sided weakness.  This results in primarily motor based deficits.  Mastication of soft solids is prolonged and results in severe vallecular residue.  When textures are modified to Dys.1 textures patient able to reduce residue with 3 swallows.  Nectar-thick liquids allowed extra time for sluggish swallow response and modification of use of teaspoon ensured small portions which resulted in penetration that either flashed or was sensed and effectively cleared pharynx with throat clears. Recommend initiation of Dys.1 textures and nectar-thick liquids with full supervision and strict use of modifications  and safe swallow strategies to maximize swallow safety.  Education completed with teach back from patient, daughter and wife.  SLP will follow acutely and daughter requests inpatient rehab for follow up therapies; SLP in agreement.        Impact on safety and function Mild aspiration risk   CHL IP TREATMENT RECOMMENDATION 10/05/2015 Treatment Recommendations Therapy as outlined in treatment plan below   Prognosis 10/05/2015 Prognosis for Safe Diet Advancement Good Barriers to Reach Goals Cognitive deficits;Time post onset;Severity of deficits Barriers/Prognosis Comment -- CHL IP DIET RECOMMENDATION 10/05/2015 SLP Diet Recommendations Dysphagia 1 (Puree) solids;Nectar thick liquid Liquid Administration via Spoon Medication Administration Crushed with puree Compensations Minimize environmental distractions;Slow rate;Small sips/bites;Multiple dry swallows after each bite/sip;Follow solids with liquid;Clear throat intermittently Postural Changes Seated upright at 90 degrees;Remain semi-upright after after feeds/meals (Comment)   CHL IP OTHER RECOMMENDATIONS 10/05/2015 Recommended Consults -- Oral Care Recommendations Oral care BID Other Recommendations Order thickener from pharmacy;Prohibited food (jello, ice cream, thin soups);Remove water pitcher;Have oral suction available   CHL IP FOLLOW UP RECOMMENDATIONS 10/05/2015 Follow up Recommendations 24 hour supervision/assistance;Inpatient Rehab   CHL IP FREQUENCY AND DURATION 10/05/2015 Speech Therapy Frequency (ACUTE ONLY) min 2x/week Treatment Duration 2 weeks      CHL IP ORAL PHASE 10/05/2015 Oral Phase Impaired Oral - Pudding Teaspoon -- Oral - Pudding Cup -- Oral - Honey Teaspoon -- Oral - Honey Cup -- Oral - Nectar Teaspoon Weak lingual manipulation;Incomplete tongue to palate contact;Reduced posterior propulsion;Holding of bolus;Lingual/palatal residue;Piecemeal swallowing;Delayed oral transit;Decreased bolus cohesion;Premature spillage Oral - Nectar Cup Left anterior  bolus loss;Weak lingual manipulation;Lingual/palatal residue;Piecemeal swallowing;Delayed oral transit;Decreased bolus cohesion;Premature spillage Oral - Nectar Straw -- Oral - Thin Teaspoon -- Oral - Thin Cup Left anterior bolus loss;Weak lingual manipulation;Incomplete tongue to palate contact;Reduced posterior propulsion;Holding of bolus;Lingual/palatal residue;Piecemeal swallowing;Delayed oral transit;Decreased bolus cohesion;Premature spillage Oral - Thin Straw -- Oral - Puree Weak lingual manipulation;Incomplete tongue to palate contact;Reduced posterior propulsion;Holding of bolus;Lingual/palatal residue;Piecemeal swallowing;Delayed oral transit;Premature spillage Oral - Mech Soft Impaired mastication;Weak lingual manipulation;Incomplete tongue to palate contact;Reduced posterior propulsion;Holding of bolus;Left pocketing in lateral sulci;Lingual/palatal residue;Piecemeal swallowing;Delayed oral transit;Premature spillage Oral - Regular --  Oral - Multi-Consistency -- Oral - Pill -- Oral Phase - Comment --  CHL IP PHARYNGEAL PHASE 10/05/2015 Pharyngeal Phase Impaired Pharyngeal- Pudding Teaspoon -- Pharyngeal -- Pharyngeal- Pudding Cup -- Pharyngeal -- Pharyngeal- Honey Teaspoon -- Pharyngeal -- Pharyngeal- Honey Cup -- Pharyngeal -- Pharyngeal- Nectar Teaspoon Delayed swallow initiation-vallecula;Reduced epiglottic inversion;Reduced tongue base retraction;Reduced laryngeal elevation;Reduced airway/laryngeal closure;Penetration/Aspiration during swallow;Pharyngeal residue - valleculae;Pharyngeal residue - pyriform Pharyngeal Material enters airway, remains ABOVE vocal cords then ejected out;Material enters airway, remains ABOVE vocal cords and not ejected out;Other (Comment) Pharyngeal- Nectar Cup Delayed swallow initiation-pyriform sinuses;Reduced epiglottic inversion;Reduced laryngeal elevation;Reduced airway/laryngeal closure;Reduced tongue base retraction;Penetration/Aspiration before  swallow;Penetration/Aspiration during swallow;Penetration/Apiration after swallow;Moderate aspiration;Pharyngeal residue - valleculae;Pharyngeal residue - pyriform Pharyngeal Material enters airway, CONTACTS cords and not ejected out;Other (Comment);Material enters airway, passes BELOW cords and not ejected out despite cough attempt by patient Pharyngeal- Nectar Straw -- Pharyngeal -- Pharyngeal- Thin Teaspoon -- Pharyngeal -- Pharyngeal- Thin Cup Delayed swallow initiation-pyriform sinuses;Reduced epiglottic inversion;Reduced laryngeal elevation;Reduced airway/laryngeal closure;Reduced tongue base retraction;Penetration/Aspiration before swallow;Penetration/Aspiration during swallow;Penetration/Apiration after swallow;Moderate aspiration;Pharyngeal residue - valleculae;Pharyngeal residue - pyriform Pharyngeal Material enters airway, passes BELOW cords and not ejected out despite cough attempt by patient Pharyngeal- Thin Straw -- Pharyngeal -- Pharyngeal- Puree Delayed swallow initiation-vallecula;Reduced epiglottic inversion;Reduced laryngeal elevation;Reduced tongue base retraction;Pharyngeal residue - valleculae;Pharyngeal residue - pyriform;Other (Comment) Pharyngeal -- Pharyngeal- Mechanical Soft Delayed swallow initiation-vallecula;Reduced laryngeal elevation;Reduced epiglottic inversion;Reduced tongue base retraction;Pharyngeal residue - valleculae;Pharyngeal residue - pyriform;Other (Comment) Pharyngeal -- Pharyngeal- Regular -- Pharyngeal -- Pharyngeal- Multi-consistency -- Pharyngeal -- Pharyngeal- Pill -- Pharyngeal -- Pharyngeal Comment --  CHL IP CERVICAL ESOPHAGEAL PHASE 10/05/2015 Cervical Esophageal Phase WFL Pudding Teaspoon -- Pudding Cup -- Honey Teaspoon -- Honey Cup -- Nectar Teaspoon -- Nectar Cup -- Nectar Straw -- Thin Teaspoon -- Thin Cup -- Thin Straw -- Puree -- Mechanical Soft -- Regular -- Multi-consistency -- Pill -- Cervical Esophageal Comment -- CHL IP GO 03/12/2014 Functional Assessment  Tool Used clinical judgement Functional Limitations Swallowing Swallow Current Status (W1191) CK Swallow Goal Status (Y7829) CK Swallow Discharge Status (F6213) CK Motor Speech Current Status (Y8657) (None) Motor Speech Goal Status (Q4696) (None) Motor Speech Goal Status (E9528) (None) Spoken Language Comprehension Current Status (U1324) (None) Spoken Language Comprehension Goal Status (M0102) (None) Spoken Language Comprehension Discharge Status (V2536) (None) Spoken Language Expression Current Status (U4403) (None) Spoken Language Expression Goal Status (K7425) (None) Spoken Language Expression Discharge Status [phone]) (None) Attention Current Status (V5643) (None) Attention Goal Status (P2951) (None) Attention Discharge Status (O8416) (None) Memory Current Status (S0630) (None) Memory Goal Status (Z6010) (None) Memory Discharge Status (X3235) (None) Voice Current Status (T7322) (None) Voice Goal Status (G2542) (None) Voice Discharge Status (H0623) (None) Other Speech-Language Pathology Functional Limitation (J6283) (None) Other Speech-Language Pathology Functional Limitation Goal Status (T5176) (None) Other Speech-Language Pathology Functional Limitation Discharge Status [phone]) (None) Fae Pippin, M.A., CCC-SLP [phone] BOWIE,MELISSA 10/05/2015, 2:49 PM               Scheduled Meds: . clopidogrel  75 mg Oral Daily  . colchicine  0.6 mg Oral Daily  . heparin  5,000 Units Subcutaneous 3 times per day  . insulin aspart  0-9 Units Subcutaneous TID WC   Continuous Infusions:   Principal Problem:   Slurred speech Active Problems:   Malignant hypertension   Diabetes (HCC)   Stroke (HCC)    Time spent: 25 min    [employer] [facility]  [facility] Pager [phone]*. If 7PM-7AM, please contact night-coverage at [email or web address], password [facility] 10/05/2015, 4:53  PM  LOS: 1 day

## 2015-10-05 NOTE — Progress Notes (Signed)
STROKE TEAM PROGRESS NOTE   HISTORY OF PRESENT ILLNESS Roger [name] is an 82 y.o. male patient who was brought into the emergency room by the EMS for an acute onset of left facial weakness, and left sided limb weakness. Patient has dementia, and is a poor historian. He is unable to specify at one time he was last normal. He started saying that 2 days ago when he went to church, he felt normal but was unable to clarify if he woke up with no symptoms this morning. Patient lives with his wife at home. His wife was not available in the emergency room during my evaluation. We tried to reach his wife on phone which was unanswered. The EMS who brought the patient, they were told that patient's symptoms started around 11 AM although there was no corroborative history available from anyone else. The initial blood pressure recorded by the EMS was systolic of 260.   Date last known well: Unknown Time last known well: Unknown tPA Given: No: Unknown time of onset or last normal   SUBJECTIVE (INTERVAL HISTORY) His daughter is at the bedside.  Overall he feels his condition is unchanged. Still has left hemiplegia but left facial droop much improved.    OBJECTIVE Temp:  [98.1 F (36.7 C)-98.6 F (37 C)] 98.1 F (36.7 C) (02/28 2131) Pulse Rate:  [68-95] 85 (02/28 2131) Cardiac Rhythm:  [-] Normal sinus rhythm (02/28 1900) Resp:  [17-20] 20 (02/28 2131) BP: (120-200)/(70-102) 200/102 mmHg (02/28 2131) SpO2:  [96 %-98 %] 98 % (02/28 2131)  CBC:  Recent Labs Lab 10/04/15 1454 10/04/15 1455  WBC 6.3  --   NEUTROABS 4.5  --   HGB 12.8* 14.6  HCT 38.9* 43.0  MCV 89.6  --   PLT 193  --     Basic Metabolic Panel:  Recent Labs Lab 10/04/15 1454 10/04/15 1455  NA 144 142  K 4.2 4.1  CL 102 102  CO2 29  --   GLUCOSE 152* 144*  BUN 21* 27*  CREATININE 1.27* 1.30*  CALCIUM 9.7  --     Lipid Panel:    Component Value Date/Time   CHOL 196 10/05/2015 0510   TRIG 130 10/05/2015 0510   HDL  45 10/05/2015 0510   CHOLHDL 4.4 10/05/2015 0510   VLDL 26 10/05/2015 0510   LDLCALC 125* 10/05/2015 0510   HgbA1c: No results found for: HGBA1C Urine Drug Screen: No results found for: LABOPIA, COCAINSCRNUR, LABBENZ, AMPHETMU, THCU, LABBARB    IMAGING  Ct Angio Head and neck W/cm &/or Wo Cm  10/05/2015  IMPRESSION: 1. No acute arterial finding. 2. Cervical and vertebral artery atherosclerosis. High-grade non dominant right vertebral ostium and V4 stenoses. 3. Intracranial atherosclerosis as described. 4. Patchy opacity in the right lung with inflammatory appearance. Correlate for symptoms of active pneumonitis/pneumonia. Electronically Signed   By: Marnee Spring M.D.   On: 10/05/2015 11:31   Dg Chest 2 View  10/04/2015  IMPRESSION: No acute cardiopulmonary disease.   Ct Head Wo Contrast  10/04/2015  IMPRESSION: No acute intracranial abnormality.   Mr Brain Wo Contrast  10/05/2015  IMPRESSION: Moderate-size acute nonhemorrhagic infarct extends from the posterior right lenticular nucleus extending to posterior right corona radiata. Remote small cerebellar infarcts bilaterally, right paracentral pontine infarct, basal ganglia infarcts, left thalamic infarct and small infarcts centrum semi ovale greater on the right. Moderate chronic microvascular changes. Global moderate atrophy without hydrocephalus. Abnormal appearance right vertebral artery.    2D echo pending  PHYSICAL EXAM  Temp:  [98.1 F (36.7 C)-98.6 F (37 C)] 98.1 F (36.7 C) (02/28 2131) Pulse Rate:  [68-95] 85 (02/28 2131) Resp:  [17-20] 20 (02/28 2131) BP: (120-200)/(70-102) 200/102 mmHg (02/28 2131) SpO2:  [96 %-98 %] 98 % (02/28 2131)  General - Well nourished, well developed, in no apparent distress.  Ophthalmologic - Fundi not visualized due to noncooperation.  Cardiovascular - Regular rate and rhythm with no murmur.  Mental Status -  Level of arousal and orientation to time, place, and person were  intact. Language including expression, naming, repetition, comprehension was assessed and found intact, but paucity of speech and moderate dysarthria. Fund of Knowledge was assessed and was intact.  Cranial Nerves II - XII - II - Visual field intact OU. III, IV, VI - Extraocular movements intact. V - Facial sensation intact bilaterally. VII - left facial droop. VIII - Hearing & vestibular intact bilaterally. X - Palate elevates symmetrically. XI - Chin turning & shoulder shrug intact bilaterally. XII - Tongue protrusion intact.  Motor Strength - The patient's strength was normal in RUE and RLE, but LUE 0/5 and LLE 1/5.  Bulk was normal and fasciculations were absent.   Motor Tone - Muscle tone was assessed at the neck and appendages and was normal.  Reflexes - The patient's reflexes were 1+ in all extremities and he had no pathological reflexes.  Sensory - Light touch, temperature/pinprick, vibration and proprioception, and Romberg testing were assessed and were symmetrical.    Coordination - The patient had normal movements in the right hand with no ataxia or dysmetria.  Tremor was absent.  Gait and Station - not tested due to weakness.    ASSESSMENT/PLAN Mr. Roger [name] is a 82 y.o. male with history of DM, HTN, HLD presenting with left facial and left side weakness. He did not receive IV t-PA due to unknown time onset.   Stroke:  Non-dominant right BG infarct, likely secondary to small vessel disease source  Resultant  Left facial and left sided weakness  MRI  Right BG infarct  CTA head and neck - diffuse intracranial athero, but no LVO  2D Echo  pending  LDL 125  HgbA1c pending  Heparin subq for VTE prophylaxis  DIET - DYS 1 Room service appropriate?: Yes; Fluid consistency:: Nectar Thick  aspirin 81 mg daily prior to admission, now on clopidogrel 75 mg daily. Continue plavix on discharge  Patient counseled to be compliant with his antithrombotic  medications  Ongoing aggressive stroke risk factor management  Therapy recommendations:  CIR   Disposition:  pending  Hypertension  Stable Permissive hypertension (OK if < 220/120) but gradually normalize in 5-7 days  Hyperlipidemia  Home meds:  No meds  LDL 125, goal < 70  Add lipitor 20mg  daily  Continue statin at discharge  Diabetes  HgbA1c pending, goal < 7.0  Controlled  SSI  Other Stroke Risk Factors  Advanced age  Other Active Problems    Hospital day # 1  Neurology will sign off. Please call with questions. Pt will follow up with Darrol Angel NP at [facility] in about 1 month. Thanks for the consult.  Marvel Plan, MD PhD Stroke Neurology 10/05/2015 10:39 PM     To contact Stroke Continuity provider, please refer to [email or web address]. After hours, contact General Neurology

## 2015-10-05 NOTE — Care Management Note (Signed)
Case Management Note  Patient Details  Name: Roger [name] MRN: [id number] Date of Birth: 06-Dec-1930  Subjective/Objective:      Patient admitted to r/o CVA. MRI pending. Patient is from home with his spouse.               Action/Plan: PT recommending CIR. CM following for discharge disposition.   Expected Discharge Date:                  Expected Discharge Plan:     In-House Referral:     Discharge planning Services     Post Acute Care Choice:    Choice offered to:     DME Arranged:    DME Agency:     HH Arranged:    HH Agency:     Status of Service:  In process, will continue to follow  Medicare Important Message Given:    Date Medicare IM Given:    Medicare IM give by:    Date Additional Medicare IM Given:    Additional Medicare Important Message give by:     If discussed at Long Length of Stay Meetings, dates discussed:    Additional Comments:  Kermit Balo, RN 10/05/2015, 10:37 AM

## 2015-10-05 NOTE — Progress Notes (Signed)
Rehab Admissions Coordinator Note:  Patient was screened by Trish Mage for appropriateness for an Inpatient Acute Rehab Consult.  At this time, an MRI is pending.  If patient is positive for stroke, then may want to order an inpatient rehab consult.  Lelon Frohlich M 10/05/2015, 10:09 AM  I can be reached at [phone].

## 2015-10-05 NOTE — Evaluation (Signed)
Physical Therapy Evaluation Patient Details Name: Roger [name] MRN: [id number] DOB: [birth year] Today's Date: 10/05/2015   History of Present Illness  Pt is an 82 y/o male who presents with slurred speech, L facial droop and L sided weakness. CT was negative for acute changes, and MRI is currently pending.   Clinical Impression  Pt admitted with above diagnosis. Pt currently with functional limitations due to the deficits listed below (see PT Problem List). At the time of PT eval pt was able to perform transfers with +2 assistance for balance support. Noted significant L sided weakness and required increased guarding/blocking on the L side. Per daughter, pt was independent with a SPC at home and living with wife. Anticipate pt will progress well at [facility] and consult was recommended.    Follow Up Recommendations CIR;Supervision/Assistance - 24 hour    Equipment Recommendations  Other (comment) (TBD with pt progression)    Recommendations for Other Services Rehab consult     Precautions / Restrictions Precautions Precautions: Fall Restrictions Weight Bearing Restrictions: No      Mobility  Bed Mobility Overal bed mobility: Needs Assistance;+2 for physical assistance Bed Mobility: Rolling;Sidelying to Sit;Sit to Sidelying Rolling: Max assist Sidelying to sit: Max assist;+2 for physical assistance     Sit to sidelying: Total assist;+2 for physical assistance General bed mobility comments: +2 required for all aspects of bed mobility. Some AROM noted in RLE but was not able to advance the LLE to EOB at all.   Transfers Overall transfer level: Needs assistance Equipment used: 2 person hand held assist Transfers: Sit to/from Stand Sit to Stand: Max assist;+2 physical assistance         General transfer comment: BLE blocked. Pt required +2 to power-up to full standing position. Pt appeared to extend that L knee once in standing but may have hyperextended.  Ambulation/Gait              General Gait Details: Unable to assess at this time  Stairs            Wheelchair Mobility    Modified Rankin (Stroke Patients Only) Modified Rankin (Stroke Patients Only) Pre-Morbid Rankin Score: No symptoms Modified Rankin: Severe disability     Balance Overall balance assessment: Needs assistance Sitting-balance support: Feet supported;Single extremity supported Sitting balance-Leahy Scale: Zero Sitting balance - Comments: Max assist at times to maintain seated balance. Briefly was able to hold himself up without max assist but was less than 5 seconds at a time. Postural control: Posterior lean Standing balance support: Bilateral upper extremity supported;During functional activity Standing balance-Leahy Scale: Zero                               Pertinent Vitals/Pain Pain Assessment: Faces Faces Pain Scale: Hurts a little bit Pain Location: Back of head/neck - possibly due to sleep apnea mask being too tight Pain Intervention(s): Repositioned    Home Living Family/patient expects to be discharged to:: Private residence Living Arrangements: Spouse/significant other Available Help at Discharge: Family;Available 24 hours/day Type of Home: House Home Access: Stairs to enter Entrance Stairs-Rails: None Entrance Stairs-Number of Steps: 2 Home Layout: One level Home Equipment: Shower seat;Cane - single point;Walker - 2 wheels;Walker - 4 wheels      Prior Function Level of Independence: Independent with assistive device(s)         Comments: SPC use. Was driving lmited distances.     Hand Dominance  Dominant Hand: Left    Extremity/Trunk Assessment   Upper Extremity Assessment: Defer to OT evaluation           Lower Extremity Assessment: LLE deficits/detail   LLE Deficits / Details: Noted weakness in the LLE especially in quads. Knee buckling in standing and required blocking for safe stand. No active movement noted.    Cervical / Trunk Assessment: Kyphotic  Communication   Communication: Expressive difficulties  Cognition Arousal/Alertness: Lethargic Behavior During Therapy: WFL for tasks assessed/performed Overall Cognitive Status: History of cognitive impairments - at baseline (Dementia at baseline)                      General Comments      Exercises        Assessment/Plan    PT Assessment Patient needs continued PT services  PT Diagnosis Difficulty walking;Generalized weakness   PT Problem List Decreased strength;Decreased range of motion;Decreased activity tolerance;Decreased balance;Decreased mobility;Decreased knowledge of use of DME;Decreased coordination;Decreased safety awareness;Decreased knowledge of precautions;Cardiopulmonary status limiting activity;Pain  PT Treatment Interventions DME instruction;Gait training;Stair training;Functional mobility training;Therapeutic activities;Therapeutic exercise;Neuromuscular re-education;Patient/family education;Wheelchair mobility training   PT Goals (Current goals can be found in the Care Plan section) Acute Rehab PT Goals Patient Stated Goal: Pt did not state goals. Daughter would like for pt to d/c to CIR PT Goal Formulation: With patient/family Time For Goal Achievement: 10/19/15 Potential to Achieve Goals: Fair    Frequency Min 4X/week   Barriers to discharge        Co-evaluation               End of Session Equipment Utilized During Treatment: Gait belt Activity Tolerance: Patient limited by fatigue Patient left: in bed;with call bell/phone within reach;with family/visitor present Nurse Communication: Mobility status         Time: 6962-9528 PT Time Calculation (min) (ACUTE ONLY): 28 min   Charges:   PT Evaluation $PT Eval Moderate Complexity: 1 Procedure PT Treatments $Therapeutic Activity: 8-22 mins   PT G Codes:        Conni Slipper 10-25-15, 8:58 AM   Conni Slipper, PT, DPT Acute  Rehabilitation Services Pager: [phone]

## 2015-10-05 NOTE — Progress Notes (Signed)
Physical medicine rehabilitation consult requested chart reviewed. Patient admitted 10/04/2015 for workup of CVA. Workup currently ongoing with echocardiogram and MRI pending. Also plan modified barium swallow today. Will follow-up with appropriate rehabilitation consult as workup is completed

## 2015-10-05 NOTE — Evaluation (Signed)
Clinical/Bedside Swallow Evaluation Patient Details  Name: Roger [name] MRN: [id number] Date of Birth: 07-Oct-1930  Today's Date: 10/05/2015 Time: SLP Start Time (ACUTE ONLY): 0930 SLP Stop Time (ACUTE ONLY): 0945 SLP Time Calculation (min) (ACUTE ONLY): 15 min  Past Medical History:  Past Medical History  Diagnosis Date  . Diabetes (HCC)   . Hypertension   . Gout   . ED (erectile dysfunction)   . Heart murmur   . Kidney stones   . Dysphagia    Past Surgical History:  Past Surgical History  Procedure Laterality Date  . Tonsillectomy    . Appendectomy    . Lithotripsy    . Uvulopalatopharyngoplasty      for snoring   HPI:  Pt is an 82 y/o male who presents with slurred speech, L facial droop and L sided weakness. CT was negative for acute changes, and MRI is currently pending.    Assessment / Plan / Recommendation Clinical Impression  Patient presents with immediate cough response following cup sip.  Despite cues for portion control and pacing patient contined to demonstrate throat clears and multiple swallows due to suspected inability to protect airway.  Given that patient was needing to utilize compensatory strategies since MBS in 2015 and his swallow function is now compounded by left sided weakness with s/s of inability to protect airway recommend objective assessment prior to diet initiation.      Aspiration Risk  Severe aspiration risk    Diet Recommendation NPO;Alternative means - temporary   Medication Administration: Via alternative means    Other  Recommendations Oral Care Recommendations: Oral care QID;Oral care prior to ice chip/H20   Follow up Recommendations  TBD   Frequency and Duration TBD         Prognosis TBD     Swallow Study   General Date of Onset: 10/04/15 HPI: Pt is an 82 y/o male who presents with slurred speech, L facial droop and L sided weakness. CT was negative for acute changes, and MRI is currently pending.  Type of Study:  Bedside Swallow Evaluation Previous Swallow Assessment: MBS 03/2014 recommended regular and thin with use of extra swallows and intermittent throat clear. Diet Prior to this Study: Regular Temperature Spikes Noted: No Respiratory Status: Room air History of Recent Intubation: No Behavior/Cognition: Alert;Cooperative;Pleasant mood Oral Cavity Assessment: Within Functional Limits Oral Care Completed by SLP: Recent completion by staff Oral Cavity - Dentition: Adequate natural dentition Vision: Functional for self-feeding Self-Feeding Abilities: Able to feed self;Needs assist;Needs set up Patient Positioning: Upright in bed Baseline Vocal Quality: Wet;Low vocal intensity;Other (comment) (dysphonic) Volitional Cough: Weak Volitional Swallow: Able to elicit    Oral/Motor/Sensory Function Overall Oral Motor/Sensory Function: Moderate impairment Facial Strength: Reduced left Lingual ROM: Reduced left Lingual Symmetry: Abnormal symmetry left Lingual Strength: Reduced   Ice Chips Ice chips: Within functional limits Presentation: Spoon Other Comments: multiple swallows   Thin Liquid Thin Liquid: Impaired Presentation: Cup;Self Fed Pharyngeal  Phase Impairments: Suspected delayed Swallow;Multiple swallows;Wet Vocal Quality;Throat Clearing - Immediate;Cough - Immediate Other Comments: cues for smaller sips prevented coughing but throat clearing still present across consistencies    Nectar Thick Nectar Thick Liquid: Not tested   Honey Thick Honey Thick Liquid: Not tested   Puree Puree: Impaired Presentation: Spoon Oral Phase Functional Implications: Prolonged oral transit Pharyngeal Phase Impairments: Suspected delayed Swallow;Multiple swallows;Throat Clearing - Immediate;Wet Vocal Quality   Solid   GO   Solid: Not tested       Fae Pippin, M.A., CCC-SLP  [phone]  Roger [name] 10/05/2015,10:05 AM

## 2015-10-06 ENCOUNTER — Ambulatory Visit (HOSPITAL_COMMUNITY): Payer: Medicare Other

## 2015-10-06 DIAGNOSIS — I6789 Other cerebrovascular disease: Secondary | ICD-10-CM

## 2015-10-06 DIAGNOSIS — E1159 Type 2 diabetes mellitus with other circulatory complications: Secondary | ICD-10-CM

## 2015-10-06 DIAGNOSIS — G8194 Hemiplegia, unspecified affecting left nondominant side: Secondary | ICD-10-CM

## 2015-10-06 DIAGNOSIS — I69319 Unspecified symptoms and signs involving cognitive functions following cerebral infarction: Secondary | ICD-10-CM

## 2015-10-06 DIAGNOSIS — R4781 Slurred speech: Secondary | ICD-10-CM

## 2015-10-06 LAB — GLUCOSE, CAPILLARY
GLUCOSE-CAPILLARY: 212 mg/dL — AB (ref 65–99)
GLUCOSE-CAPILLARY: 264 mg/dL — AB (ref 65–99)
GLUCOSE-CAPILLARY: 183 mg/dL — AB (ref 65–99)
Glucose-Capillary: 187 mg/dL — ABNORMAL HIGH (ref 65–99)

## 2015-10-06 LAB — HEMOGLOBIN A1C
Hgb A1c MFr Bld: 8.1 % — ABNORMAL HIGH (ref 4.8–5.6)
Mean Plasma Glucose: 186 mg/dL

## 2015-10-06 MED ORDER — DOCUSATE SODIUM 100 MG PO CAPS
200.0000 mg | ORAL_CAPSULE | Freq: Every day | ORAL | Status: DC
  Administered 2015-10-06: 200 mg via ORAL
  Filled 2015-10-06: qty 2

## 2015-10-06 MED ORDER — POLYETHYLENE GLYCOL 3350 17 G PO PACK: 17.0000 g | PACK | Freq: Every day | ORAL | Status: DC | PRN

## 2015-10-06 NOTE — PMR Pre-admission (Signed)
PMR Admission Coordinator Pre-Admission Assessment  Patient: Roger [name] is an 82 y.o., male MRN: [id number] DOB: 02/15/1931 Height: 5' 9"  (175.3 cm) Weight: 141 lbs             Insurance Information HMO:     PPO: No     PCP:       IPA:       80/20:       OTHER:  Group # [id number] PRIMARY: [employer] Medicare      Policy#: [id number]      Subscriber:  Tanja Port CM Name: Earney Hamburg      Phone#: [phone]     Fax#:  Epic access Pre-Cert#: [id number]      Employer:  Retired Benefits:  Phone #: [phone]     Name:  Hanley Seamen. 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 right handed male with history of hypertension, diabetes mellitus and dementia. Patient lives with spouse independent with assistive device prior to admission. One level home with 2 steps to entry. He was driving short distances prior to admission. Presented 10/04/2015 with left-sided weakness. MRI of the brain shows moderate size acute nonhemorrhagic infarct extending from the posterior right lenticular nuclear was extending into posterior right corona radiata. Remote small cerebellar infarcts bilaterally, right paracentral pontine infarct, basal ganglia infarct, left thalamic infarct and small infarcts Centrum semi-ovale greater on the right. CTA of the head with no acute arterial finding. Echocardiogram showed grade 1 diastolic dysfunction, no wall motion abnormalities. Systolic function was normal. Patient did  not receive TPA. Neurology consulted presently on Plavix for CVA prophylaxis. Subcutaneous heparin for DVT prophylaxis. Dysphagia #1 nectar thick liquid diet. Physical and occupational therapy evaluation completed 10/05/2015 with recommendations of physical medicine rehabilitation consult. Patient to be admitted for comprehensive inpatient rehabilitation program.   Total: 13=NIH  Past Medical History  Past Medical History  Diagnosis Date  . Diabetes ([place])   . Hypertension   . Gout   . ED (erectile dysfunction)   . Heart murmur   . Kidney stones   . Dysphagia     Family History  family history is negative for Ataxia, Chorea, Dementia, Mental retardation, Migraines, Multiple sclerosis, Neurofibromatosis, Neuropathy, Parkinsonism, Seizures, and Stroke.  Prior Rehab/Hospitalizations: No previous rehab admissions  Has the patient had major surgery during 100 days prior to admission? No  Current Medications   Current facility-administered medications:  .  atorvastatin (LIPITOR) tablet 20 mg, 20 mg, Oral, q1800, Rosalin Hawking, MD, 20 mg at 10/06/15 1752 .  clopidogrel (PLAVIX) tablet 75 mg, 75 mg, Oral, Daily, Ram Fuller Mandril, MD, 75 mg at 10/07/15 0945 .  colchicine tablet 0.6 mg, 0.6 mg, Oral, Daily, Rondel Jumbo, PA-C, 0.6 mg at 10/07/15 0945 .  docusate sodium (COLACE) capsule 200 mg, 200 mg, Oral, QHS, Santiago Glad  Armida Sans, NP, 200 mg at 10/06/15 2106 .  heparin injection 5,000 Units, 5,000 Units, Subcutaneous, 3 times per day, Rondel Jumbo, PA-C, 5,000 Units at 10/07/15 0620 .  hydrALAZINE (APRESOLINE) injection 5-10 mg, 5-10 mg, Intravenous, Q6H PRN, Rondel Jumbo, PA-C .  insulin aspart (novoLOG) injection 0-9 Units, 0-9 Units, Subcutaneous, TID WC, Rondel Jumbo, PA-C, 3 Units at 10/07/15 [id number] .  labetalol (NORMODYNE,TRANDATE) injection 5-10 mg, 5-10 mg, Intravenous, Q6H PRN, Rondel Jumbo, PA-C .  polyethylene glycol (MIRALAX / GLYCOLAX) packet 17 g, 17 g, Oral,  Daily PRN, Gardiner Barefoot, NP  Patients Current Diet: DIET - DYS 1 Room service appropriate?: Yes; Fluid consistency:: Nectar Thick Diet - low sodium heart healthy  Precautions / Restrictions Precautions Precautions: Fall Restrictions Weight Bearing Restrictions: No   Has the patient had 2 or more falls or a fall with injury in the past year?No.  Patient reports 1 fall where he stumbled, no injury.  Prior Activity Level Community (5-7x/wk): Went out daily.  Has a garden.  Home Assistive Devices / Equipment Home Assistive Devices/Equipment: None Home Equipment: Shower seat, Cane - single point, Environmental consultant - 2 wheels, Walker - 4 wheels  Prior Device Use: Indicate devices/aids used by the patient prior to current illness, exacerbation or injury? Cane  Prior Functional Level Prior Function Level of Independence: Independent with assistive device(s) Comments: SPC use. Was driving lmited distances.  Self Care: Did the patient need help bathing, dressing, using the toilet or eating?  Independent  Indoor Mobility: Did the patient need assistance with walking from room to room (with or without device)? Independent  Stairs: Did the patient need assistance with internal or external stairs (with or without device)? Independent  Functional Cognition: Did the patient need help planning regular tasks such as shopping or remembering to take medications? Independent  Current Functional Level Cognition  Overall Cognitive Status: History of cognitive impairments - at baseline Difficult to assess due to: Impaired communication Orientation Level: Oriented to person, Oriented to situation, Disoriented to time, Oriented to place    Extremity Assessment (includes Sensation/Coordination)  Upper Extremity Assessment: LUE deficits/detail LUE Deficits / Details: PROM WFL. Increased tone with quick stretch. No AROM noted. Strength 0/5 overall. No grip strength. LUE Coordination: decreased fine  motor, decreased gross motor  Lower Extremity Assessment: Defer to PT evaluation LLE Deficits / Details: Noted weakness in the LLE especially in quads. Knee buckling in standing and required blocking for safe stand. No active movement noted.     ADLs  Overall ADL's : Needs assistance/impaired Eating/Feeding: Minimal assistance, Sitting Grooming: Minimal assistance, Sitting Upper Body Bathing: Moderate assistance, Sitting Lower Body Bathing: Maximal assistance, +2 for physical assistance, Sit to/from stand Upper Body Dressing : Moderate assistance, Sitting Lower Body Dressing: Maximal assistance, +2 for physical assistance, Sit to/from stand Toilet Transfer: Maximal assistance, +2 for physical assistance, Cueing for sequencing, Stand-pivot, BSC (2 person hand held assist) Toilet Transfer Details (indicate cue type and reason): Simulated by transfer from EOB to chair. Functional mobility during ADLs: Maximal assistance, +2 for physical assistance, Cueing for sequencing (for stand pivot transfer) General ADL Comments: Pts daughter present for OT eval. Educated on LUE positioning techniques, LUE ROM techniques, incorporating LUE into functional activities.    Mobility  Overal bed mobility: Needs Assistance, +2 for physical assistance Bed Mobility: Supine to Sit Rolling: Max assist Sidelying to sit: Max assist, +2 for physical assistance Supine to sit: Mod assist, +2 for physical assistance Sit to  sidelying: Total assist, +2 for physical assistance General bed mobility comments: Pt was able to advance LE's towards the EOB. Slight assist required for LLE movement. Pt held to the L railing with RUE for support, and +2 assist was provided for trunk elevation to full sitting position. Bed pad used to complete scooting out to EOB.     Transfers  Overall transfer level: Needs assistance Equipment used: 2 person hand held assist Transfers: Sit to/from Stand Sit to Stand: Max assist, +2 physical  assistance General transfer comment: LLE blocked. Pt required +2 to power-up to full standing position. With max support provided, pt was able to advance RLE towards chair with assist to slide LLE along.     Ambulation / Gait / Stairs / Wheelchair Mobility  Ambulation/Gait General Gait Details: Unable at this time,    Posture / Balance Dynamic Sitting Balance Sitting balance - Comments: Required assist to maintain upright posture even with RUE support on bed rails.  Balance Overall balance assessment: Needs assistance Sitting-balance support: Feet supported, Single extremity supported Sitting balance-Leahy Scale: Poor Sitting balance - Comments: Required assist to maintain upright posture even with RUE support on bed rails.  Postural control: Posterior lean Standing balance support: Single extremity supported, During functional activity Standing balance-Leahy Scale: Zero Standing balance comment: +2 assist required for static standing balance.    Special needs/care consideration BiPAP/CPAP Yes, has CPAP CPM No Continuous Drip IV No Dialysis No         Life Vest No Oxygen No Special Bed No Trach Size No Wound Vac (area) No     Skin No                       Bowel mgmt: No documented BM since admission Bladder mgmt: Condom catheter, incontinence Diabetic mgmt Yes, managed with diet at home, no medications at home    Previous Home Environment Living Arrangements: Spouse/significant other Available Help at Discharge: Family, Available 24 hours/day Type of Home: House Home Layout: One level Home Access: Stairs to enter Entrance Stairs-Rails: None Entrance Stairs-Number of Steps: 2 Bathroom Shower/Tub: Optometrist: Yes Home Care Services: No  Discharge Living Setting Plans for Discharge Living Setting: Patient's home, House, Lives with (comment) (Lives with wife and has a cat named Spunky) Type of Home at Discharge:  House Discharge Home Layout: One level Discharge Home Access: Stairs to enter Entrance Stairs-Number of Steps: 2 steps at the back with no rail Does the patient have any problems obtaining your medications?: No  Social/Family/Support Systems Patient Roles: Spouse, Parent, Other (Comment) (Has a wife, daughter and a step daughter.) Contact Information: Federico Maiorino - wife [phone] Anticipated Caregiver: wife and daughter and may hire caregivers Anticipated Caregiver's Contact Information: Smitty Pluck - daughter - [phone] Ability/Limitations of Caregiver: Wife and daughter can provide supervision, but not physical assist.  They may hire a Psychologist, counselling for prn care Caregiver Availability: 24/7 Discharge Plan Discussed with Primary Caregiver: Yes Is Caregiver In Agreement with Plan?: Yes Does Caregiver/Family have Issues with Lodging/Transportation while Pt is in Rehab?: No  Goals/Additional Needs Patient/Family Goal for Rehab: PT/OT/ST supervision goals Expected length of stay: 20-23 days Cultural Considerations: Mina Marble, goes to church on Sundays Dietary Needs: Dys 1, nectar thick liquids Equipment Needs: TBD Pt/Family Agrees to Admission and willing to participate: Yes Program Orientation Provided & Reviewed with Pt/Caregiver Including Roles  & Responsibilities: Yes  Decrease burden of Care through IP rehab admission:  N/A  Possible need for SNF placement upon discharge: Not anticipated  Patient Condition: This patient's condition remains as documented in the consult dated 10/06/15, in which the Rehabilitation Physician determined and documented that the patient's condition is appropriate for intensive rehabilitative care in an inpatient rehabilitation facility. Will admit to inpatient rehab today.  Preadmission Screen Completed By:  [name], 10/07/2015 11:16 AM ______________________________________________________________________   Discussed status with  Dr. Letta Pate on 10/07/15 at 77 and received telephone approval for admission today.  Admission Coordinator:  [name], time1116/Date03/02/17

## 2015-10-06 NOTE — Progress Notes (Signed)
Rehab admissions - I have approval for acute inpatient rehab admission from [facility].  I spoke with Dr. Cena Benton who says results of Echo are pending.  Dr. Cena Benton prefers to wait until tomorrow when Echo results are known.  Will hold inpatient rehab admission until tomorrow per attending MD.  Call me for questions.  #[phone]

## 2015-10-06 NOTE — Consult Note (Signed)
Physical Medicine and Rehabilitation Consult Reason for Consult: Right basal ganglia infarct Referring Physician: Family medicine   HPI: Roger [name] is a 82 y.o. right handed male with history of hypertension, diabetes mellitus and dementia. Patient lives with spouse independent with assistive device prior to admission. One level home with 2 steps to entry. He was driving short distances prior to admission. Presented 10/04/2015 with left-sided weakness. MRI of the brain shows moderate size acute nonhemorrhagic infarct extending from the posterior right lenticular nuclear was extending into posterior right corona radiata. Remote small cerebellar infarcts bilaterally, right paracentral pontine infarct, basal ganglia infarct, left thalamic infarct and small infarcts Centrum semi-ovale greater on the right. CTA of the head with no acute arterial finding. Echocardiogram is pending. Patient did not receive TPA. Neurology consulted presently on Plavix for CVA prophylaxis. Subcutaneous heparin for DVT prophylaxis. Dysphagia #1 nectar thick liquid diet. Physical therapy evaluation completed 10/05/2015 with recommendations of physical medicine rehabilitation consult.  Patient has had some fluctuating mental status over the last several years according to his daughter. He seemed to get worse and then improve over weeks or months. He was still driving on a restricted license Less  than 15 miles and no Interstate driving.  Review of Systems  Unable to perform ROS: mental acuity   Past Medical History  Diagnosis Date  . Diabetes (HCC)   . Hypertension   . Gout   . ED (erectile dysfunction)   . Heart murmur   . Kidney stones   . Dysphagia    Past Surgical History  Procedure Laterality Date  . Tonsillectomy    . Appendectomy    . Lithotripsy    . Uvulopalatopharyngoplasty      for snoring   Family History  Problem Relation Age of Onset  . Ataxia Neg Hx   . Chorea Neg Hx   . Dementia Neg  Hx   . Mental retardation Neg Hx   . Migraines Neg Hx   . Multiple sclerosis Neg Hx   . Neurofibromatosis Neg Hx   . Neuropathy Neg Hx   . Parkinsonism Neg Hx   . Seizures Neg Hx   . Stroke Neg Hx    Social History:  reports that he has never smoked. He does not have any smokeless tobacco history on file. He reports that he does not drink alcohol or use illicit drugs. Allergies:  Allergies  Allergen Reactions  . Aricept [Donepezil Hcl]     Nightmares, Hallucinations  . Namenda [Memantine Hcl]     Nightmares, Hallucinations   Medications Prior to Admission  Medication Sig Dispense Refill  . aspirin EC 81 MG tablet Take 81 mg by mouth daily.    . B Complex-C (B-COMPLEX WITH VITAMIN C) tablet Take 1 tablet by mouth daily.    . cholecalciferol (VITAMIN D) 1000 units tablet Take 1,000 Units by mouth daily.    Marland Kitchen COLCRYS 0.6 MG tablet Take 0.6 mg by mouth daily as needed (gout flare). Reported on 10/04/2015    . finasteride (PROSCAR) 5 MG tablet Take 5 mg by mouth daily at 12 noon.    Marland Kitchen losartan (COZAAR) 100 MG tablet Take 100 mg by mouth daily.     . metFORMIN (GLUCOPHAGE-XR) 500 MG 24 hr tablet Take 500 mg by mouth 2 (two) times daily.     . tamsulosin (FLOMAX) 0.4 MG CAPS capsule Take 0.4 mg by mouth at bedtime.    . vitamin B-12 (CYANOCOBALAMIN) 1000 MCG tablet  Take 1,000 mcg by mouth daily.    . Memantine HCl ER (NAMENDA XR) 7 MG CP24 Start 1 tab qdx7d, then 2tab qdx7d, then 3tab qdx7d.  Call for refill (Patient not taking: Reported on 10/04/2015) 42 capsule 0    Home: Home Living Family/patient expects to be discharged to:: Private residence Living Arrangements: Spouse/significant other Available Help at Discharge: Family, Available 24 hours/day Type of Home: House Home Access: Stairs to enter [employer] of Steps: 2 Entrance Stairs-Rails: None Home Layout: One level Bathroom Shower/Tub: Engineer, manufacturing systems: Standard Bathroom Accessibility: Yes Home  Equipment: Information systems manager, Medical laboratory scientific officer - single point, Environmental consultant - 2 wheels, Walker - 4 wheels  Functional History: Prior Function Level of Independence: Independent with assistive device(s) Comments: SPC use. Was driving lmited distances. Functional Status:  Mobility: Bed Mobility Overal bed mobility: Needs Assistance, +2 for physical assistance Bed Mobility: Rolling, Sidelying to Sit, Sit to Sidelying Rolling: Max assist Sidelying to sit: Max assist, +2 for physical assistance Sit to sidelying: Total assist, +2 for physical assistance General bed mobility comments: +2 required for all aspects of bed mobility. Some AROM noted in RLE but was not able to advance the LLE to EOB at all.  Transfers Overall transfer level: Needs assistance Equipment used: 2 person hand held assist Transfers: Sit to/from Stand Sit to Stand: Max assist, +2 physical assistance General transfer comment: BLE blocked. Pt required +2 to power-up to full standing position. Pt appeared to extend that L knee once in standing but may have hyperextended. Ambulation/Gait General Gait Details: Unable to assess at this time    ADL:    Cognition: Cognition Overall Cognitive Status: History of cognitive impairments - at baseline (Dementia at baseline) Orientation Level: Oriented X4 Cognition Arousal/Alertness: Lethargic Behavior During Therapy: WFL for tasks assessed/performed Overall Cognitive Status: History of cognitive impairments - at baseline (Dementia at baseline) Difficult to assess due to: Impaired communication  Blood pressure 191/103, pulse 81, temperature 97.6 F (36.4 C), temperature source Oral, resp. rate 20, SpO2 100 %. Physical Exam  HENT:  Left facial droop  Eyes: EOM are normal.  Neck: Normal range of motion. Neck supple. No thyromegaly present.  Cardiovascular: Normal rate and regular rhythm.   Respiratory: Effort normal and breath sounds normal. No respiratory distress.  GI: Soft. Bowel sounds are  normal. He exhibits no distension.  Neurological:  Patient is alert and pleasantly confused. Moderate dysarthria. Poor historian. He will follow some simple verbal commands.  Skin: Skin is warm and dry.  Motor strength is 0/5 in the left deltoid, biceps, triceps, grip, hip flexor, knee extensor, ankle dorsiflexor. Sensation intact to light touch in the left upper and left lower extremity. Right side has normal strength and sensation   Results for orders placed or performed during the hospital encounter of 10/04/15 (from the past 24 hour(s))  Glucose, capillary     Status: Abnormal   Collection Time: 10/05/15  6:58 AM  Result Value Ref Range   Glucose-Capillary 145 (H) 65 - 99 mg/dL   Comment 1 Notify RN    Comment 2 Document in Chart   Vitamin B12     Status: None   Collection Time: 10/05/15  9:52 AM  Result Value Ref Range   Vitamin B-12 574 180 - 914 pg/mL  Glucose, capillary     Status: Abnormal   Collection Time: 10/05/15  4:24 PM  Result Value Ref Range   Glucose-Capillary 172 (H) 65 - 99 mg/dL   Comment 1 Notify  RN    Comment 2 Document in Chart   Glucose, capillary     Status: Abnormal   Collection Time: 10/05/15  9:09 PM  Result Value Ref Range   Glucose-Capillary 214 (H) 65 - 99 mg/dL   Comment 1 Notify RN    Comment 2 Document in Chart   Glucose, capillary     Status: Abnormal   Collection Time: 10/06/15  6:12 AM  Result Value Ref Range   Glucose-Capillary 212 (H) 65 - 99 mg/dL   Comment 1 Notify RN    Comment 2 Document in Chart    Ct Angio Head W/cm &/or Wo Cm  10/05/2015  CLINICAL DATA:  Stroke. EXAM: CT ANGIOGRAPHY HEAD AND NECK TECHNIQUE: Multidetector CT imaging of the head and neck was performed using the standard protocol during bolus administration of intravenous contrast. Multiplanar CT image reconstructions and MIPs were obtained to evaluate the vascular anatomy. Carotid stenosis measurements (when applicable) are obtained utilizing NASCET criteria, using  the distal internal carotid diameter as the denominator. CONTRAST:  50mL OMNIPAQUE IOHEXOL 350 MG/ML SOLN COMPARISON:  Head CT from yesterday FINDINGS: CTA NECK Aortic arch: No aneurysm or dissection. 2 vessel branching. Extensive atherosclerotic plaque mixed area Right carotid system: Diffuse atheromatous wall thickening. No flow limiting (50% or greater) stenosis. No dissection. Left carotid system:Diffuse atheromatous wall thickening. No flow limiting (50% or greater) stenosis. Vertebral arteries: No dissection. No proximal subclavian flow limiting stenosis. Left vertebral artery dominance. No evidence of dissection. High-grade right ostial stenosis. Skeleton: No contributory findings Other neck: Patchy ground-glass and nodular opacities in the right upper lobe with a infectious/ inflammatory appearance CTA HEAD Anterior circulation: Symmetric carotid arteries. Hypoplastic right A1 segment with anterior communicating artery. Bilateral posterior communicating arteries. No proximal occlusion or flow limiting stenosis. Atherosclerotic calcification on the carotid siphons. High-grade distal left M2 branch stenosis best seen on coronal reformats image 106. Elsewhere atherosclerotic irregularity and narrowing is mild to moderate. Posterior circulation: Left dominant. Early branching right PICA. The right V4 segment diffuse atherosclerotic type irregularity, with minimal if any contribution to the basilar. These narrowings are beyond the PICA origin. Atherosclerotic irregularity of the basilar which is narrow in the setting of sizable posterior communicating arteries. No major branch occlusion. Congenitally small left P1 segment with superimposed atherosclerotic irregularity. Best seen on coronal reformats there is moderate stenosis of right P4 branch. Negative for aneurysm. Venous sinuses: Patent Anatomic variants: Intact circle-of-Willis Delayed phase: Not performed IMPRESSION: 1. No acute arterial finding. 2.  Cervical and vertebral artery atherosclerosis. High-grade non dominant right vertebral ostium and V4 stenoses. 3. Intracranial atherosclerosis as described. 4. Patchy opacity in the right lung with inflammatory appearance. Correlate for symptoms of active pneumonitis/pneumonia. Electronically Signed   By: Marnee Spring M.D.   On: 10/05/2015 11:31   Dg Chest 2 View  10/04/2015  CLINICAL DATA:  Stroke symptoms since 1100 hrs today, hx diabetic, hx hypertension EXAM: CHEST  2 VIEW COMPARISON:  08/12/2015 FINDINGS: Cardiac silhouette normal in size and configuration. No mediastinal or hilar masses or evidence of adenopathy. Previously seen airspace opacities have resolved. Lungs are mildly hyperexpanded. There are mildly prominent bronchovascular markings which are stable. No evidence of pneumonia or edema. No pleural effusion or pneumothorax. Bony thorax is demineralized but grossly intact. IMPRESSION: No acute cardiopulmonary disease. Electronically Signed   By: Amie Portland M.D.   On: 10/04/2015 17:58   Ct Head Wo Contrast  10/04/2015  ADDENDUM REPORT: 10/04/2015 15:18 ADDENDUM: Return call at 3:18  p.m. with direct communication with Dr. Lavon Paganini. Electronically Signed   By: Jeronimo Greaves M.D.   On: 10/04/2015 15:18  10/04/2015  CLINICAL DATA:  Slurred speech. Left-sided facial droop. Hypertension and diabetes. EXAM: CT HEAD WITHOUT CONTRAST TECHNIQUE: Contiguous axial images were obtained from the base of the skull through the vertex without intravenous contrast. COMPARISON:  MRI of 03/12/2014. FINDINGS: Sinuses/Soft tissues: Surgical changes about both globes. Minimal mucosal thickening of ethmoid air cells. Hypoplastic frontal sinuses. Clear mastoid air cells. Intracranial: Expected cerebral volume loss for age. Mild for age low density in the periventricular white matter likely related to small vessel disease. Bilateral vertebral and carotid intracranial atherosclerosis. No mass lesion, hemorrhage,  hydrocephalus, acute infarct, intra-axial, or extra-axial fluid collection. IMPRESSION: No acute intracranial abnormality. The referring physician was paged at 3 p.m. and a return call is pending. Electronically Signed: By: Jeronimo Greaves M.D. On: 10/04/2015 15:01   Ct Angio Neck W/cm &/or Wo/cm  10/05/2015  CLINICAL DATA:  Stroke. EXAM: CT ANGIOGRAPHY HEAD AND NECK TECHNIQUE: Multidetector CT imaging of the head and neck was performed using the standard protocol during bolus administration of intravenous contrast. Multiplanar CT image reconstructions and MIPs were obtained to evaluate the vascular anatomy. Carotid stenosis measurements (when applicable) are obtained utilizing NASCET criteria, using the distal internal carotid diameter as the denominator. CONTRAST:  50mL OMNIPAQUE IOHEXOL 350 MG/ML SOLN COMPARISON:  Head CT from yesterday FINDINGS: CTA NECK Aortic arch: No aneurysm or dissection. 2 vessel branching. Extensive atherosclerotic plaque mixed area Right carotid system: Diffuse atheromatous wall thickening. No flow limiting (50% or greater) stenosis. No dissection. Left carotid system:Diffuse atheromatous wall thickening. No flow limiting (50% or greater) stenosis. Vertebral arteries: No dissection. No proximal subclavian flow limiting stenosis. Left vertebral artery dominance. No evidence of dissection. High-grade right ostial stenosis. Skeleton: No contributory findings Other neck: Patchy ground-glass and nodular opacities in the right upper lobe with a infectious/ inflammatory appearance CTA HEAD Anterior circulation: Symmetric carotid arteries. Hypoplastic right A1 segment with anterior communicating artery. Bilateral posterior communicating arteries. No proximal occlusion or flow limiting stenosis. Atherosclerotic calcification on the carotid siphons. High-grade distal left M2 branch stenosis best seen on coronal reformats image 106. Elsewhere atherosclerotic irregularity and narrowing is mild to  moderate. Posterior circulation: Left dominant. Early branching right PICA. The right V4 segment diffuse atherosclerotic type irregularity, with minimal if any contribution to the basilar. These narrowings are beyond the PICA origin. Atherosclerotic irregularity of the basilar which is narrow in the setting of sizable posterior communicating arteries. No major branch occlusion. Congenitally small left P1 segment with superimposed atherosclerotic irregularity. Best seen on coronal reformats there is moderate stenosis of right P4 branch. Negative for aneurysm. Venous sinuses: Patent Anatomic variants: Intact circle-of-Willis Delayed phase: Not performed IMPRESSION: 1. No acute arterial finding. 2. Cervical and vertebral artery atherosclerosis. High-grade non dominant right vertebral ostium and V4 stenoses. 3. Intracranial atherosclerosis as described. 4. Patchy opacity in the right lung with inflammatory appearance. Correlate for symptoms of active pneumonitis/pneumonia. Electronically Signed   By: Marnee Spring M.D.   On: 10/05/2015 11:31   Mr Brain Wo Contrast  10/05/2015  CLINICAL DATA:  82 year old hypertensive diabetic male with facial asymmetry, slurred speech and left-sided weakness. Subsequent encounter. EXAM: MRI HEAD WITHOUT CONTRAST TECHNIQUE: Multiplanar, multiecho pulse sequences of the brain and surrounding structures were obtained without intravenous contrast. COMPARISON:  CT angiogram 10/05/2015. Head CT 10/04/2015. Brain MR 03/12/2014. FINDINGS: Exam is motion degraded. Moderate-size acute nonhemorrhagic infarct  extends from the posterior right lenticular nucleus extending to posterior right corona radiata. Tiny area blood breakdown products posterior left frontal lobe most consistent with result prior hemorrhagic ischemia. Remote small cerebellar infarcts bilaterally, right paracentral pontine infarct, basal ganglia infarcts, left thalamic infarct and small infarcts centrum semi ovale greater on  the right. Moderate chronic microvascular changes. Global moderate atrophy without hydrocephalus. No intracranial mass lesion noted on this unenhanced exam. Abnormal appearance right vertebral artery. Please see CT angiogram report. Post lens replacement. Cervical medullary junction and pineal region unremarkable. IMPRESSION: Moderate-size acute nonhemorrhagic infarct extends from the posterior right lenticular nucleus extending to posterior right corona radiata. Remote small cerebellar infarcts bilaterally, right paracentral pontine infarct, basal ganglia infarcts, left thalamic infarct and small infarcts centrum semi ovale greater on the right. Moderate chronic microvascular changes. Global moderate atrophy without hydrocephalus. Abnormal appearance right vertebral artery. Please see CT angiogram report. Electronically Signed   By: Lacy Duverney M.D.   On: 10/05/2015 13:33   Dg Swallowing Func-speech Pathology  10/05/2015  Objective Swallowing Evaluation: Type of Study: MBS-Modified Barium Swallow Study Patient Details Name: Roger [name] MRN: [id number] Date of Birth: [birth year] Today's Date: 10/05/2015 Time: SLP Start Time (ACUTE ONLY): 1056-SLP Stop Time (ACUTE ONLY): 1119 SLP Time Calculation (min) (ACUTE ONLY): 23 min Past Medical History: Past Medical History Diagnosis Date . Diabetes (HCC)  . Hypertension  . Gout  . ED (erectile dysfunction)  . Heart murmur  . Kidney stones  . Dysphagia  Past Surgical History: Past Surgical History Procedure Laterality Date . Tonsillectomy   . Appendectomy   . Lithotripsy   . Uvulopalatopharyngoplasty     for snoring HPI: Pt is an 82 y/o male who presents with slurred speech, L facial droop and L sided weakness. CT was negative for acute changes, and MRI is currently pending.  Subjective: patient upright in bed with daughter at bedside Assessment / Plan / Recommendation CHL IP CLINICAL IMPRESSIONS 10/05/2015 Therapy Diagnosis Severe oral phase dysphagia;Moderate pharyngeal  phase dysphagia;Other (Comment) Clinical Impression Patient presents with baseline mild dysphagia, which is now compounded by new onset of left sided weakness.  This results in primarily motor based deficits.  Mastication of soft solids is prolonged and results in severe vallecular residue.  When textures are modified to Dys.1 textures patient able to reduce residue with 3 swallows.  Nectar-thick liquids allowed extra time for sluggish swallow response and modification of use of teaspoon ensured small portions which resulted in penetration that either flashed or was sensed and effectively cleared pharynx with throat clears. Recommend initiation of Dys.1 textures and nectar-thick liquids with full supervision and strict use of modifications and safe swallow strategies to maximize swallow safety.  Education completed with teach back from patient, daughter and wife.  SLP will follow acutely and daughter requests inpatient rehab for follow up therapies; SLP in agreement.        Impact on safety and function Mild aspiration risk   CHL IP TREATMENT RECOMMENDATION 10/05/2015 Treatment Recommendations Therapy as outlined in treatment plan below   Prognosis 10/05/2015 Prognosis for Safe Diet Advancement Good Barriers to Reach Goals Cognitive deficits;Time post onset;Severity of deficits Barriers/Prognosis Comment -- CHL IP DIET RECOMMENDATION 10/05/2015 SLP Diet Recommendations Dysphagia 1 (Puree) solids;Nectar thick liquid Liquid Administration via Spoon Medication Administration Crushed with puree Compensations Minimize environmental distractions;Slow rate;Small sips/bites;Multiple dry swallows after each bite/sip;Follow solids with liquid;Clear throat intermittently Postural Changes Seated upright at 90 degrees;Remain semi-upright after after feeds/meals (Comment)   CHL IP OTHER  RECOMMENDATIONS 10/05/2015 Recommended Consults -- Oral Care Recommendations Oral care BID Other Recommendations Order thickener from  pharmacy;Prohibited food (jello, ice cream, thin soups);Remove water pitcher;Have oral suction available   CHL IP FOLLOW UP RECOMMENDATIONS 10/05/2015 Follow up Recommendations 24 hour supervision/assistance;Inpatient Rehab   CHL IP FREQUENCY AND DURATION 10/05/2015 Speech Therapy Frequency (ACUTE ONLY) min 2x/week Treatment Duration 2 weeks      CHL IP ORAL PHASE 10/05/2015 Oral Phase Impaired Oral - Pudding Teaspoon -- Oral - Pudding Cup -- Oral - Honey Teaspoon -- Oral - Honey Cup -- Oral - Nectar Teaspoon Weak lingual manipulation;Incomplete tongue to palate contact;Reduced posterior propulsion;Holding of bolus;Lingual/palatal residue;Piecemeal swallowing;Delayed oral transit;Decreased bolus cohesion;Premature spillage Oral - Nectar Cup Left anterior bolus loss;Weak lingual manipulation;Lingual/palatal residue;Piecemeal swallowing;Delayed oral transit;Decreased bolus cohesion;Premature spillage Oral - Nectar Straw -- Oral - Thin Teaspoon -- Oral - Thin Cup Left anterior bolus loss;Weak lingual manipulation;Incomplete tongue to palate contact;Reduced posterior propulsion;Holding of bolus;Lingual/palatal residue;Piecemeal swallowing;Delayed oral transit;Decreased bolus cohesion;Premature spillage Oral - Thin Straw -- Oral - Puree Weak lingual manipulation;Incomplete tongue to palate contact;Reduced posterior propulsion;Holding of bolus;Lingual/palatal residue;Piecemeal swallowing;Delayed oral transit;Premature spillage Oral - Mech Soft Impaired mastication;Weak lingual manipulation;Incomplete tongue to palate contact;Reduced posterior propulsion;Holding of bolus;Left pocketing in lateral sulci;Lingual/palatal residue;Piecemeal swallowing;Delayed oral transit;Premature spillage Oral - Regular -- Oral - Multi-Consistency -- Oral - Pill -- Oral Phase - Comment --  CHL IP PHARYNGEAL PHASE 10/05/2015 Pharyngeal Phase Impaired Pharyngeal- Pudding Teaspoon -- Pharyngeal -- Pharyngeal- Pudding Cup -- Pharyngeal -- Pharyngeal-  Honey Teaspoon -- Pharyngeal -- Pharyngeal- Honey Cup -- Pharyngeal -- Pharyngeal- Nectar Teaspoon Delayed swallow initiation-vallecula;Reduced epiglottic inversion;Reduced tongue base retraction;Reduced laryngeal elevation;Reduced airway/laryngeal closure;Penetration/Aspiration during swallow;Pharyngeal residue - valleculae;Pharyngeal residue - pyriform Pharyngeal Material enters airway, remains ABOVE vocal cords then ejected out;Material enters airway, remains ABOVE vocal cords and not ejected out;Other (Comment) Pharyngeal- Nectar Cup Delayed swallow initiation-pyriform sinuses;Reduced epiglottic inversion;Reduced laryngeal elevation;Reduced airway/laryngeal closure;Reduced tongue base retraction;Penetration/Aspiration before swallow;Penetration/Aspiration during swallow;Penetration/Apiration after swallow;Moderate aspiration;Pharyngeal residue - valleculae;Pharyngeal residue - pyriform Pharyngeal Material enters airway, CONTACTS cords and not ejected out;Other (Comment);Material enters airway, passes BELOW cords and not ejected out despite cough attempt by patient Pharyngeal- Nectar Straw -- Pharyngeal -- Pharyngeal- Thin Teaspoon -- Pharyngeal -- Pharyngeal- Thin Cup Delayed swallow initiation-pyriform sinuses;Reduced epiglottic inversion;Reduced laryngeal elevation;Reduced airway/laryngeal closure;Reduced tongue base retraction;Penetration/Aspiration before swallow;Penetration/Aspiration during swallow;Penetration/Apiration after swallow;Moderate aspiration;Pharyngeal residue - valleculae;Pharyngeal residue - pyriform Pharyngeal Material enters airway, passes BELOW cords and not ejected out despite cough attempt by patient Pharyngeal- Thin Straw -- Pharyngeal -- Pharyngeal- Puree Delayed swallow initiation-vallecula;Reduced epiglottic inversion;Reduced laryngeal elevation;Reduced tongue base retraction;Pharyngeal residue - valleculae;Pharyngeal residue - pyriform;Other (Comment) Pharyngeal -- Pharyngeal-  Mechanical Soft Delayed swallow initiation-vallecula;Reduced laryngeal elevation;Reduced epiglottic inversion;Reduced tongue base retraction;Pharyngeal residue - valleculae;Pharyngeal residue - pyriform;Other (Comment) Pharyngeal -- Pharyngeal- Regular -- Pharyngeal -- Pharyngeal- Multi-consistency -- Pharyngeal -- Pharyngeal- Pill -- Pharyngeal -- Pharyngeal Comment --  CHL IP CERVICAL ESOPHAGEAL PHASE 10/05/2015 Cervical Esophageal Phase WFL Pudding Teaspoon -- Pudding Cup -- Honey Teaspoon -- Honey Cup -- Nectar Teaspoon -- Nectar Cup -- Nectar Straw -- Thin Teaspoon -- Thin Cup -- Thin Straw -- Puree -- Mechanical Soft -- Regular -- Multi-consistency -- Pill -- Cervical Esophageal Comment -- [facility] IP GO 03/12/2014 Functional Assessment Tool Used clinical judgement Functional Limitations Swallowing Swallow Current Status (G2952) CK Swallow Goal Status (W4132) CK Swallow Discharge Status (G4010) CK Motor Speech Current Status (U7253) (None) Motor Speech Goal Status (G6440) (None) Motor Speech Goal Status (H4742) (None) Spoken Language Comprehension Current  Status [phone]) (None) Spoken Language Comprehension Goal Status [phone]) (None) Spoken Language Comprehension Discharge Status [phone]) (None) Spoken Language Expression Current Status [phone]) (None) Spoken Language Expression Goal Status [phone]) (None) Spoken Language Expression Discharge Status [phone]) (None) Attention Current Status (V7846) (None) Attention Goal Status (N6295) (None) Attention Discharge Status [phone]) (None) Memory Current Status (K4401) (None) Memory Goal Status (U2725) (None) Memory Discharge Status (D6644) (None) Voice Current Status (I3474) (None) Voice Goal Status (Q5956) (None) Voice Discharge Status [phone]) (None) Other Speech-Language Pathology Functional Limitation [phone]) (None) Other Speech-Language Pathology Functional Limitation Goal Status [phone]) (None) Other Speech-Language Pathology Functional Limitation Discharge Status [phone])  (None) Charlane Ferretti., CCC-SLP [phone] BOWIE,MELISSA 10/05/2015, 2:49 PM               Assessment/Plan: Diagnosis: 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 Does the need for close, 24 hr/day medical supervision in concert with the patient's rehab needs make it unreasonable for this patient to be served in a less intensive setting? Yes 2. Co-Morbidities requiring supervision/potential complications: Multi-infarct dementia, hypertension, diabetes 3. Due to bladder management, bowel management, safety, skin/wound care, disease management, medication administration and pain management, does the patient require 24 hr/day rehab nursing? Yes 4. Does the patient require coordinated care of a physician, rehab nurse, PT (1-2 hrs/day, 5 days/week), OT (11-2 hrs/day, 5 days/week) and SLP (0.5-1 hrs/day, 5 days/week) to address physical and functional deficits in the context of the above medical diagnosis(es)? Yes Addressing deficits in the following areas: balance, endurance, locomotion, strength, transferring, bowel/bladder control, bathing, dressing, feeding, grooming, toileting, cognition, speech, language, swallowing and psychosocial support 5. Can the patient actively participate in an intensive therapy program of at least 3 hrs of therapy per day at least 5 days per week? Yes 6. The potential for patient to make measurable gains while on inpatient rehab is good 7. Anticipated functional outcomes upon discharge from inpatient rehab are supervision  with PT, supervision with OT, supervision with SLP. 8. Estimated rehab length of stay to reach the above functional goals is: 20-23 days 9. Does the patient have adequate social supports and living environment to accommodate these discharge functional goals? Potentially 10. Anticipated D/C setting: Home 11. Anticipated post D/C treatments: HH therapy 12. Overall Rehab/Functional Prognosis:  good  RECOMMENDATIONS: This patient's condition is appropriate for continued rehabilitative care in the following setting: CIR Patient has agreed to participate in recommended program. Potentially Note that insurance prior authorization may be required for reimbursement for recommended care.  Comment: Daughter is able to provide intermittent supervision, wife can provide supervision but no physical assistance    10/06/2015

## 2015-10-06 NOTE — Progress Notes (Signed)
Physical Therapy Treatment Patient Details Name: Roger [name] MRN: [id number] DOB: 05/07/1931 Today's Date: 10/06/2015    History of Present Illness Pt is an 82 y/o male who presents with slurred speech, L facial droop and L sided weakness. CT was negative for acute changes, and MRI is currently pending.     PT Comments    Pt progressing towards physical therapy goals. Was able to perform transfer bed>chair with +2 assist for balance and safety. Pt demonstrated increased AROM of the LLE, however did not note any active movement of the LUE during session. Continue to feel this patient is an excellent candidate for CIR at d/c. Will continue to follow and progress as able per POC.   Follow Up Recommendations  CIR;Supervision/Assistance - 24 hour     Equipment Recommendations  Other (comment) (TBD by next venue of care)    Recommendations for Other Services Rehab consult     Precautions / Restrictions Precautions Precautions: Fall Restrictions Weight Bearing Restrictions: No    Mobility  Bed Mobility Overal bed mobility: Needs Assistance;+2 for physical assistance Bed Mobility: Supine to Sit     Supine to sit: Mod assist;+2 for physical assistance     General bed mobility comments: Pt was able to advance LE's towards the EOB. Slight assist required for LLE movement. Pt held to the L railing with RUE for support, and +2 assist was provided for trunk elevation to full sitting position. Bed pad used to complete scooting out to EOB.   Transfers Overall transfer level: Needs assistance Equipment used: 2 person hand held assist Transfers: Sit to/from Stand Sit to Stand: Max assist;+2 physical assistance         General transfer comment: LLE blocked. Pt required +2 to power-up to full standing position. With max support provided, pt was able to advance RLE towards chair with assist to slide LLE along.   Ambulation/Gait             General Gait Details: Unable at this  time,   Stairs            Wheelchair Mobility    Modified Rankin (Stroke Patients Only) Modified Rankin (Stroke Patients Only) Pre-Morbid Rankin Score: No symptoms Modified Rankin: Severe disability     Balance Overall balance assessment: Needs assistance Sitting-balance support: Feet supported;Single extremity supported Sitting balance-Leahy Scale: Poor Sitting balance - Comments: Required assist to maintain upright posture even with RUE support on bed rails.  Postural control: Posterior lean Standing balance support: Single extremity supported;During functional activity Standing balance-Leahy Scale: Zero Standing balance comment: +2 required for static standing balance.                     Cognition Arousal/Alertness: Awake/alert Behavior During Therapy: WFL for tasks assessed/performed;Flat affect Overall Cognitive Status: History of cognitive impairments - at baseline                      Exercises      General Comments        Pertinent Vitals/Pain Pain Assessment: No/denies pain    Home Living                      Prior Function            PT Goals (current goals can now be found in the care plan section) Acute Rehab PT Goals Patient Stated Goal: Pt did not state goals. Daughter would like for pt to d/c  to CIR PT Goal Formulation: With patient/family Time For Goal Achievement: 10/19/15 Potential to Achieve Goals: Fair Progress towards PT goals: Progressing toward goals    Frequency  Min 4X/week    PT Plan Current plan remains appropriate    Co-evaluation PT/OT/SLP Co-Evaluation/Treatment: Yes Reason for Co-Treatment: For patient/therapist safety PT goals addressed during session: Mobility/safety with mobility;Balance       End of Session Equipment Utilized During Treatment: Gait belt Activity Tolerance: Patient tolerated treatment well Patient left: with family/visitor present;in chair;with chair alarm set;with  call bell/phone within reach     Time: 0839-0900 PT Time Calculation (min) (ACUTE ONLY): 21 min  Charges:  $Gait Training: 8-22 mins                    G Codes:      Conni Slipper Oct 08, 2015, 9:23 AM   Conni Slipper, PT, DPT Acute Rehabilitation Services Pager: [phone]

## 2015-10-06 NOTE — Progress Notes (Signed)
  Echocardiogram 2D Echocardiogram has been performed.  Leta Jungling M 10/06/2015, 3:21 PM

## 2015-10-06 NOTE — Progress Notes (Signed)
[facility] PROGRESS NOTE  Brannon Levene ZOX:[id number] DOB: 1931-07-26 DOA: 10/04/2015 PCP: No primary care provider on file.  Assessment/Plan: 1. Stroke- MRI brain shows basal ganglia stroke. Stroke workup pending (echocardiogram). Aspirin changed to Plavix 75 mg by mouth daily. Neurology following. 2. Hypertension- blood pressure elevated, and hypertensive medications on hold due to permissive hypertension. 3. Diabetes mellitus- continue to hold metformin, sliding scale insulin with NovoLog. 4. DVT prophylaxis- Lovenox  Code Status: Full code Family Communication: Discussed with patient's daughter at bedside Disposition Plan: Awaiting placement into [facility]   Consultants:  Neurology  Procedures:  Echo-ordered  Carotid duplex- ordered  Antibiotics:  None  HPI/Subjective: 82 y.o. male history of hypertension, diabetes mellitus, Alzheimer's dementia, presenting with new onset slurred speech, left facial droop and left sided weakness. He was leaning heavily on the left side. Symptoms began around 11 this morning. No previous history of TIA or strokwe. CT head negative. MRI brain pending. Speech has improved but not resolved. No dizziness.He was last known well before bed last night. Patient was not administered TPA , symptoms beyond time window for treatment consideration.   Patient has no new complaints   Objective: Filed Vitals:   10/06/15 0953 10/06/15 1531  BP: 136/66 158/87  Pulse: 80 82  Temp: 98 F (36.7 C) 98.1 F (36.7 C)  Resp: 20 20    Intake/Output Summary (Last 24 hours) at 10/06/15 1615 Last data filed at 10/06/15 0816  Gross per 24 hour  Intake    120 ml  Output      0 ml  Net    120 ml   There were no vitals filed for this visit.  Exam:   General:  Appears in no acute distress, alert and awake  Cardiovascular: S1-S2 normal, regular rhythm  Respiratory: Clear to auscultation bilaterally, no wheezing or crackles, no increased work of  breathing  Abdomen: Soft, nontender, no organomegaly  Musculoskeletal: No cyanosis/clubbing/edema of the lower extremities   Data Reviewed: Basic Metabolic Panel:  Recent Labs Lab 10/04/15 1454 10/04/15 1455  NA 144 142  K 4.2 4.1  CL 102 102  CO2 29  --   GLUCOSE 152* 144*  BUN 21* 27*  CREATININE 1.27* 1.30*  CALCIUM 9.7  --    Liver Function Tests:  Recent Labs Lab 10/04/15 1454  AST 33  ALT 13*  ALKPHOS 66  BILITOT 1.0  PROT 6.8  ALBUMIN 3.9   CBC:  Recent Labs Lab 10/04/15 1454 10/04/15 1455  WBC 6.3  --   NEUTROABS 4.5  --   HGB 12.8* 14.6  HCT 38.9* 43.0  MCV 89.6  --   PLT 193  --    CBG:  Recent Labs Lab 10/05/15 0658 10/05/15 1624 10/05/15 2109 10/06/15 0612 10/06/15 1117  GLUCAP 145* 172* 214* 212* 187*    No results found for this or any previous visit (from the past 240 hour(s)).   Studies: Ct Angio Head W/cm &/or Wo Cm  10/05/2015  CLINICAL DATA:  Stroke. EXAM: CT ANGIOGRAPHY HEAD AND NECK TECHNIQUE: Multidetector CT imaging of the head and neck was performed using the standard protocol during bolus administration of intravenous contrast. Multiplanar CT image reconstructions and MIPs were obtained to evaluate the vascular anatomy. Carotid stenosis measurements (when applicable) are obtained utilizing NASCET criteria, using the distal internal carotid diameter as the denominator. CONTRAST:  50mL OMNIPAQUE IOHEXOL 350 MG/ML SOLN COMPARISON:  Head CT from yesterday FINDINGS: CTA NECK Aortic arch: No aneurysm or dissection. 2  vessel branching. Extensive atherosclerotic plaque mixed area Right carotid system: Diffuse atheromatous wall thickening. No flow limiting (50% or greater) stenosis. No dissection. Left carotid system:Diffuse atheromatous wall thickening. No flow limiting (50% or greater) stenosis. Vertebral arteries: No dissection. No proximal subclavian flow limiting stenosis. Left vertebral artery dominance. No evidence of dissection.  High-grade right ostial stenosis. Skeleton: No contributory findings Other neck: Patchy ground-glass and nodular opacities in the right upper lobe with a infectious/ inflammatory appearance CTA HEAD Anterior circulation: Symmetric carotid arteries. Hypoplastic right A1 segment with anterior communicating artery. Bilateral posterior communicating arteries. No proximal occlusion or flow limiting stenosis. Atherosclerotic calcification on the carotid siphons. High-grade distal left M2 branch stenosis best seen on coronal reformats image 106. Elsewhere atherosclerotic irregularity and narrowing is mild to moderate. Posterior circulation: Left dominant. Early branching right PICA. The right V4 segment diffuse atherosclerotic type irregularity, with minimal if any contribution to the basilar. These narrowings are beyond the PICA origin. Atherosclerotic irregularity of the basilar which is narrow in the setting of sizable posterior communicating arteries. No major branch occlusion. Congenitally small left P1 segment with superimposed atherosclerotic irregularity. Best seen on coronal reformats there is moderate stenosis of right P4 branch. Negative for aneurysm. Venous sinuses: Patent Anatomic variants: Intact circle-of-Willis Delayed phase: Not performed IMPRESSION: 1. No acute arterial finding. 2. Cervical and vertebral artery atherosclerosis. High-grade non dominant right vertebral ostium and V4 stenoses. 3. Intracranial atherosclerosis as described. 4. Patchy opacity in the right lung with inflammatory appearance. Correlate for symptoms of active pneumonitis/pneumonia. Electronically Signed   By: Marnee Spring M.D.   On: 10/05/2015 11:31   Dg Chest 2 View  10/04/2015  CLINICAL DATA:  Stroke symptoms since 1100 hrs today, hx diabetic, hx hypertension EXAM: CHEST  2 VIEW COMPARISON:  08/12/2015 FINDINGS: Cardiac silhouette normal in size and configuration. No mediastinal or hilar masses or evidence of adenopathy.  Previously seen airspace opacities have resolved. Lungs are mildly hyperexpanded. There are mildly prominent bronchovascular markings which are stable. No evidence of pneumonia or edema. No pleural effusion or pneumothorax. Bony thorax is demineralized but grossly intact. IMPRESSION: No acute cardiopulmonary disease. Electronically Signed   By: Amie Portland M.D.   On: 10/04/2015 17:58   Ct Angio Neck W/cm &/or Wo/cm  10/05/2015  CLINICAL DATA:  Stroke. EXAM: CT ANGIOGRAPHY HEAD AND NECK TECHNIQUE: Multidetector CT imaging of the head and neck was performed using the standard protocol during bolus administration of intravenous contrast. Multiplanar CT image reconstructions and MIPs were obtained to evaluate the vascular anatomy. Carotid stenosis measurements (when applicable) are obtained utilizing NASCET criteria, using the distal internal carotid diameter as the denominator. CONTRAST:  50mL OMNIPAQUE IOHEXOL 350 MG/ML SOLN COMPARISON:  Head CT from yesterday FINDINGS: CTA NECK Aortic arch: No aneurysm or dissection. 2 vessel branching. Extensive atherosclerotic plaque mixed area Right carotid system: Diffuse atheromatous wall thickening. No flow limiting (50% or greater) stenosis. No dissection. Left carotid system:Diffuse atheromatous wall thickening. No flow limiting (50% or greater) stenosis. Vertebral arteries: No dissection. No proximal subclavian flow limiting stenosis. Left vertebral artery dominance. No evidence of dissection. High-grade right ostial stenosis. Skeleton: No contributory findings Other neck: Patchy ground-glass and nodular opacities in the right upper lobe with a infectious/ inflammatory appearance CTA HEAD Anterior circulation: Symmetric carotid arteries. Hypoplastic right A1 segment with anterior communicating artery. Bilateral posterior communicating arteries. No proximal occlusion or flow limiting stenosis. Atherosclerotic calcification on the carotid siphons. High-grade distal left  M2 branch stenosis best seen on  coronal reformats image 106. Elsewhere atherosclerotic irregularity and narrowing is mild to moderate. Posterior circulation: Left dominant. Early branching right PICA. The right V4 segment diffuse atherosclerotic type irregularity, with minimal if any contribution to the basilar. These narrowings are beyond the PICA origin. Atherosclerotic irregularity of the basilar which is narrow in the setting of sizable posterior communicating arteries. No major branch occlusion. Congenitally small left P1 segment with superimposed atherosclerotic irregularity. Best seen on coronal reformats there is moderate stenosis of right P4 branch. Negative for aneurysm. Venous sinuses: Patent Anatomic variants: Intact circle-of-Willis Delayed phase: Not performed IMPRESSION: 1. No acute arterial finding. 2. Cervical and vertebral artery atherosclerosis. High-grade non dominant right vertebral ostium and V4 stenoses. 3. Intracranial atherosclerosis as described. 4. Patchy opacity in the right lung with inflammatory appearance. Correlate for symptoms of active pneumonitis/pneumonia. Electronically Signed   By: Marnee Spring M.D.   On: 10/05/2015 11:31   Mr Brain Wo Contrast  10/05/2015  CLINICAL DATA:  82 year old hypertensive diabetic male with facial asymmetry, slurred speech and left-sided weakness. Subsequent encounter. EXAM: MRI HEAD WITHOUT CONTRAST TECHNIQUE: Multiplanar, multiecho pulse sequences of the brain and surrounding structures were obtained without intravenous contrast. COMPARISON:  CT angiogram 10/05/2015. Head CT 10/04/2015. Brain MR 03/12/2014. FINDINGS: Exam is motion degraded. Moderate-size acute nonhemorrhagic infarct extends from the posterior right lenticular nucleus extending to posterior right corona radiata. Tiny area blood breakdown products posterior left frontal lobe most consistent with result prior hemorrhagic ischemia. Remote small cerebellar infarcts bilaterally,  right paracentral pontine infarct, basal ganglia infarcts, left thalamic infarct and small infarcts centrum semi ovale greater on the right. Moderate chronic microvascular changes. Global moderate atrophy without hydrocephalus. No intracranial mass lesion noted on this unenhanced exam. Abnormal appearance right vertebral artery. Please see CT angiogram report. Post lens replacement. Cervical medullary junction and pineal region unremarkable. IMPRESSION: Moderate-size acute nonhemorrhagic infarct extends from the posterior right lenticular nucleus extending to posterior right corona radiata. Remote small cerebellar infarcts bilaterally, right paracentral pontine infarct, basal ganglia infarcts, left thalamic infarct and small infarcts centrum semi ovale greater on the right. Moderate chronic microvascular changes. Global moderate atrophy without hydrocephalus. Abnormal appearance right vertebral artery. Please see CT angiogram report. Electronically Signed   By: Lacy Duverney M.D.   On: 10/05/2015 13:33   Dg Swallowing Func-speech Pathology  10/05/2015  Objective Swallowing Evaluation: Type of Study: MBS-Modified Barium Swallow Study Patient Details Name: Lucius Wise MRN: [id number] Date of Birth: Jan 30, 1931 Today's Date: 10/05/2015 Time: SLP Start Time (ACUTE ONLY): 1056-SLP Stop Time (ACUTE ONLY): 1119 SLP Time Calculation (min) (ACUTE ONLY): 23 min Past Medical History: Past Medical History Diagnosis Date . Diabetes (HCC)  . Hypertension  . Gout  . ED (erectile dysfunction)  . Heart murmur  . Kidney stones  . Dysphagia  Past Surgical History: Past Surgical History Procedure Laterality Date . Tonsillectomy   . Appendectomy   . Lithotripsy   . Uvulopalatopharyngoplasty     for snoring HPI: Pt is an 82 y/o male who presents with slurred speech, L facial droop and L sided weakness. CT was negative for acute changes, and MRI is currently pending.  Subjective: patient upright in bed with daughter at bedside Assessment  / Plan / Recommendation CHL IP CLINICAL IMPRESSIONS 10/05/2015 Therapy Diagnosis Severe oral phase dysphagia;Moderate pharyngeal phase dysphagia;Other (Comment) Clinical Impression Patient presents with baseline mild dysphagia, which is now compounded by new onset of left sided weakness.  This results in primarily motor based deficits.  Mastication  of soft solids is prolonged and results in severe vallecular residue.  When textures are modified to Dys.1 textures patient able to reduce residue with 3 swallows.  Nectar-thick liquids allowed extra time for sluggish swallow response and modification of use of teaspoon ensured small portions which resulted in penetration that either flashed or was sensed and effectively cleared pharynx with throat clears. Recommend initiation of Dys.1 textures and nectar-thick liquids with full supervision and strict use of modifications and safe swallow strategies to maximize swallow safety.  Education completed with teach back from patient, daughter and wife.  SLP will follow acutely and daughter requests inpatient rehab for follow up therapies; SLP in agreement.        Impact on safety and function Mild aspiration risk   CHL IP TREATMENT RECOMMENDATION 10/05/2015 Treatment Recommendations Therapy as outlined in treatment plan below   Prognosis 10/05/2015 Prognosis for Safe Diet Advancement Good Barriers to Reach Goals Cognitive deficits;Time post onset;Severity of deficits Barriers/Prognosis Comment -- CHL IP DIET RECOMMENDATION 10/05/2015 SLP Diet Recommendations Dysphagia 1 (Puree) solids;Nectar thick liquid Liquid Administration via Spoon Medication Administration Crushed with puree Compensations Minimize environmental distractions;Slow rate;Small sips/bites;Multiple dry swallows after each bite/sip;Follow solids with liquid;Clear throat intermittently Postural Changes Seated upright at 90 degrees;Remain semi-upright after after feeds/meals (Comment)   CHL IP OTHER RECOMMENDATIONS  10/05/2015 Recommended Consults -- Oral Care Recommendations Oral care BID Other Recommendations Order thickener from pharmacy;Prohibited food (jello, ice cream, thin soups);Remove water pitcher;Have oral suction available   CHL IP FOLLOW UP RECOMMENDATIONS 10/05/2015 Follow up Recommendations 24 hour supervision/assistance;Inpatient Rehab   CHL IP FREQUENCY AND DURATION 10/05/2015 Speech Therapy Frequency (ACUTE ONLY) min 2x/week Treatment Duration 2 weeks      CHL IP ORAL PHASE 10/05/2015 Oral Phase Impaired Oral - Pudding Teaspoon -- Oral - Pudding Cup -- Oral - Honey Teaspoon -- Oral - Honey Cup -- Oral - Nectar Teaspoon Weak lingual manipulation;Incomplete tongue to palate contact;Reduced posterior propulsion;Holding of bolus;Lingual/palatal residue;Piecemeal swallowing;Delayed oral transit;Decreased bolus cohesion;Premature spillage Oral - Nectar Cup Left anterior bolus loss;Weak lingual manipulation;Lingual/palatal residue;Piecemeal swallowing;Delayed oral transit;Decreased bolus cohesion;Premature spillage Oral - Nectar Straw -- Oral - Thin Teaspoon -- Oral - Thin Cup Left anterior bolus loss;Weak lingual manipulation;Incomplete tongue to palate contact;Reduced posterior propulsion;Holding of bolus;Lingual/palatal residue;Piecemeal swallowing;Delayed oral transit;Decreased bolus cohesion;Premature spillage Oral - Thin Straw -- Oral - Puree Weak lingual manipulation;Incomplete tongue to palate contact;Reduced posterior propulsion;Holding of bolus;Lingual/palatal residue;Piecemeal swallowing;Delayed oral transit;Premature spillage Oral - Mech Soft Impaired mastication;Weak lingual manipulation;Incomplete tongue to palate contact;Reduced posterior propulsion;Holding of bolus;Left pocketing in lateral sulci;Lingual/palatal residue;Piecemeal swallowing;Delayed oral transit;Premature spillage Oral - Regular -- Oral - Multi-Consistency -- Oral - Pill -- Oral Phase - Comment --  CHL IP PHARYNGEAL PHASE 10/05/2015  Pharyngeal Phase Impaired Pharyngeal- Pudding Teaspoon -- Pharyngeal -- Pharyngeal- Pudding Cup -- Pharyngeal -- Pharyngeal- Honey Teaspoon -- Pharyngeal -- Pharyngeal- Honey Cup -- Pharyngeal -- Pharyngeal- Nectar Teaspoon Delayed swallow initiation-vallecula;Reduced epiglottic inversion;Reduced tongue base retraction;Reduced laryngeal elevation;Reduced airway/laryngeal closure;Penetration/Aspiration during swallow;Pharyngeal residue - valleculae;Pharyngeal residue - pyriform Pharyngeal Material enters airway, remains ABOVE vocal cords then ejected out;Material enters airway, remains ABOVE vocal cords and not ejected out;Other (Comment) Pharyngeal- Nectar Cup Delayed swallow initiation-pyriform sinuses;Reduced epiglottic inversion;Reduced laryngeal elevation;Reduced airway/laryngeal closure;Reduced tongue base retraction;Penetration/Aspiration before swallow;Penetration/Aspiration during swallow;Penetration/Apiration after swallow;Moderate aspiration;Pharyngeal residue - valleculae;Pharyngeal residue - pyriform Pharyngeal Material enters airway, CONTACTS cords and not ejected out;Other (Comment);Material enters airway, passes BELOW cords and not ejected out despite cough attempt by patient Pharyngeal- Nectar Straw -- Pharyngeal --  Pharyngeal- Thin Teaspoon -- Pharyngeal -- Pharyngeal- Thin Cup Delayed swallow initiation-pyriform sinuses;Reduced epiglottic inversion;Reduced laryngeal elevation;Reduced airway/laryngeal closure;Reduced tongue base retraction;Penetration/Aspiration before swallow;Penetration/Aspiration during swallow;Penetration/Apiration after swallow;Moderate aspiration;Pharyngeal residue - valleculae;Pharyngeal residue - pyriform Pharyngeal Material enters airway, passes BELOW cords and not ejected out despite cough attempt by patient Pharyngeal- Thin Straw -- Pharyngeal -- Pharyngeal- Puree Delayed swallow initiation-vallecula;Reduced epiglottic inversion;Reduced laryngeal elevation;Reduced tongue  base retraction;Pharyngeal residue - valleculae;Pharyngeal residue - pyriform;Other (Comment) Pharyngeal -- Pharyngeal- Mechanical Soft Delayed swallow initiation-vallecula;Reduced laryngeal elevation;Reduced epiglottic inversion;Reduced tongue base retraction;Pharyngeal residue - valleculae;Pharyngeal residue - pyriform;Other (Comment) Pharyngeal -- Pharyngeal- Regular -- Pharyngeal -- Pharyngeal- Multi-consistency -- Pharyngeal -- Pharyngeal- Pill -- Pharyngeal -- Pharyngeal Comment --  CHL IP CERVICAL ESOPHAGEAL PHASE 10/05/2015 Cervical Esophageal Phase WFL Pudding Teaspoon -- Pudding Cup -- Honey Teaspoon -- Honey Cup -- Nectar Teaspoon -- Nectar Cup -- Nectar Straw -- Thin Teaspoon -- Thin Cup -- Thin Straw -- Puree -- Mechanical Soft -- Regular -- Multi-consistency -- Pill -- Cervical Esophageal Comment -- CHL IP GO 03/12/2014 Functional Assessment Tool Used clinical judgement Functional Limitations Swallowing Swallow Current Status (W0981) CK Swallow Goal Status (X9147) CK Swallow Discharge Status (W2956) CK Motor Speech Current Status (O1308) (None) Motor Speech Goal Status (M5784) (None) Motor Speech Goal Status (O9629) (None) Spoken Language Comprehension Current Status (B2841) (None) Spoken Language Comprehension Goal Status (L2440) (None) Spoken Language Comprehension Discharge Status (N0272) (None) Spoken Language Expression Current Status (Z3664) (None) Spoken Language Expression Goal Status (Q0347) (None) Spoken Language Expression Discharge Status [phone]) (None) Attention Current Status (G3875) (None) Attention Goal Status (I4332) (None) Attention Discharge Status (R5188) (None) Memory Current Status (C1660) (None) Memory Goal Status (Y3016) (None) Memory Discharge Status (W1093) (None) Voice Current Status (A3557) (None) Voice Goal Status (D2202) (None) Voice Discharge Status (R4270) (None) Other Speech-Language Pathology Functional Limitation (W2376) (None) Other Speech-Language Pathology Functional  Limitation Goal Status (E8315) (None) Other Speech-Language Pathology Functional Limitation Discharge Status [phone]) (None) Fae Pippin, M.A., CCC-SLP [phone] BOWIE,MELISSA 10/05/2015, 2:49 PM               Scheduled Meds: . atorvastatin  20 mg Oral q1800  . clopidogrel  75 mg Oral Daily  . colchicine  0.6 mg Oral Daily  . heparin  5,000 Units Subcutaneous 3 times per day  . insulin aspart  0-9 Units Subcutaneous TID WC   Continuous Infusions:   Principal Problem:   Slurred speech Active Problems:   Malignant hypertension   Diabetes (HCC)   Stroke (HCC)   HLD (hyperlipidemia)    Time spent: 25 min    Penny Pia  [facility] Pager [phone]. If 7PM-7AM, please contact night-coverage at [email or web address], password [facility] 10/06/2015, 4:15 PM  LOS: 2 days

## 2015-10-06 NOTE — Evaluation (Signed)
Occupational Therapy Evaluation Patient Details Name: Roger [name] MRN: [id number] DOB: Mar 17, 1931 Today's Date: 10/06/2015    History of Present Illness Pt is an 82 y/o male who presents with slurred speech, L facial droop and L sided weakness. CT was negative for acute changes, and MRI is currently pending.    Clinical Impression   Pt reports he was independent with ADLs PTA. Currently pt overall max assist +2 for functional mobility and mod-max assist for ADLs. Pt noted to have no active movement or strength in LUE; increased tone with quick stretch. Began education on positioning, ROM, and incorporating LUE into functional activities with pt and daughter. Recommending CIR for follow up to maximize independence and safety with ADLs and functional mobility prior to return home. Pt would benefit from continued skilled OT to address established goals.    Follow Up Recommendations  CIR;Supervision/Assistance - 24 hour    Equipment Recommendations  Other (comment) (TBD)    Recommendations for Other Services       Precautions / Restrictions Precautions Precautions: Fall Restrictions Weight Bearing Restrictions: No      Mobility Bed Mobility Overal bed mobility: Needs Assistance;+2 for physical assistance Bed Mobility: Supine to Sit     Supine to sit: Mod assist;+2 for physical assistance     General bed mobility comments: Pt was able to advance LE's towards the EOB. Slight assist required for LLE movement. Pt held to the L railing with RUE for support, and +2 assist was provided for trunk elevation to full sitting position. Bed pad used to complete scooting out to EOB.   Transfers Overall transfer level: Needs assistance Equipment used: 2 person hand held assist Transfers: Sit to/from Stand Sit to Stand: Max assist;+2 physical assistance         General transfer comment: LLE blocked. Pt required +2 to power-up to full standing position. With max support provided, pt was  able to advance RLE towards chair with assist to slide LLE along.     Balance Overall balance assessment: Needs assistance Sitting-balance support: Feet supported;Single extremity supported Sitting balance-Leahy Scale: Poor Sitting balance - Comments: Required assist to maintain upright posture even with RUE support on bed rails.  Postural control: Posterior lean Standing balance support: Single extremity supported;During functional activity Standing balance-Leahy Scale: Zero Standing balance comment: +2 assist required for static standing balance.                            ADL Overall ADL's : Needs assistance/impaired Eating/Feeding: Minimal assistance;Sitting   Grooming: Minimal assistance;Sitting   Upper Body Bathing: Moderate assistance;Sitting   Lower Body Bathing: Maximal assistance;+2 for physical assistance;Sit to/from stand   Upper Body Dressing : Moderate assistance;Sitting   Lower Body Dressing: Maximal assistance;+2 for physical assistance;Sit to/from stand   Toilet Transfer: Maximal assistance;+2 for physical assistance;Cueing for sequencing;Stand-pivot;BSC (2 person hand held assist) Toilet Transfer Details (indicate cue type and reason): Simulated by transfer from EOB to chair.         Functional mobility during ADLs: Maximal assistance;+2 for physical assistance;Cueing for sequencing (for stand pivot transfer) General ADL Comments: Pts daughter present for OT eval. Educated on LUE positioning techniques, LUE ROM techniques, incorporating LUE into functional activities.     Vision     Perception     Praxis      Pertinent Vitals/Pain Pain Assessment: No/denies pain     Hand Dominance Right   Extremity/Trunk Assessment Upper Extremity Assessment Upper  Extremity Assessment: LUE deficits/detail LUE Deficits / Details: PROM WFL. Increased tone with quick stretch. No AROM noted. Strength 0/5 overall. No grip strength. LUE Coordination:  decreased fine motor;decreased gross motor   Lower Extremity Assessment Lower Extremity Assessment: Defer to PT evaluation   Cervical / Trunk Assessment Cervical / Trunk Assessment: Kyphotic   Communication Communication Communication: Expressive difficulties   Cognition Arousal/Alertness: Awake/alert Behavior During Therapy: WFL for tasks assessed/performed;Flat affect Overall Cognitive Status: History of cognitive impairments - at baseline                     General Comments       Exercises       Shoulder Instructions      Home Living Family/patient expects to be discharged to:: Private residence Living Arrangements: Spouse/significant other Available Help at Discharge: Family;Available 24 hours/day Type of Home: House Home Access: Stairs to enter [employer] of Steps: 2 Entrance Stairs-Rails: None Home Layout: One level     Bathroom Shower/Tub: Chief Strategy Officer: Standard Bathroom Accessibility: Yes   Home Equipment: Shower seat;Cane - single point;Walker - 2 wheels;Walker - 4 wheels          Prior Functioning/Environment Level of Independence: Independent with assistive device(s)        Comments: SPC use. Was driving lmited distances.    OT Diagnosis: Generalized weakness;Acute pain;Hemiplegia non-dominant side   OT Problem List: Decreased strength;Decreased range of motion;Decreased activity tolerance;Impaired balance (sitting and/or standing);Decreased coordination;Decreased knowledge of use of DME or AE;Decreased knowledge of precautions;Impaired sensation;Impaired tone;Impaired UE functional use   OT Treatment/Interventions: Self-care/ADL training;Therapeutic exercise;Neuromuscular education;Energy conservation;DME and/or AE instruction;Therapeutic activities;Patient/family education;Balance training    OT Goals(Current goals can be found in the care plan section) Acute Rehab OT Goals Patient Stated Goal: Pt did  not state goals. Daughter would like for pt to d/c to CIR OT Goal Formulation: With patient/family Time For Goal Achievement: 10/20/15 Potential to Achieve Goals: Good ADL Goals Pt Will Perform Grooming: with supervision;sitting Pt Will Perform Upper Body Bathing: sitting;with min assist Pt Will Perform Lower Body Bathing: with min assist;sit to/from stand Pt Will Transfer to Toilet: with min assist;stand pivot transfer;bedside commode Pt Will Perform Toileting - Clothing Manipulation and hygiene: with min assist;sit to/from stand Pt/caregiver will Perform Home Exercise Program: Increased ROM;Increased strength;Left upper extremity;With minimal assist;With written HEP provided  OT Frequency: Min 2X/week   Barriers to D/C:            Co-evaluation PT/OT/SLP Co-Evaluation/Treatment: Yes Reason for Co-Treatment: For patient/therapist safety PT goals addressed during session: Mobility/safety with mobility;Balance OT goals addressed during session: ADL's and self-care      End of Session Equipment Utilized During Treatment: Gait belt  Activity Tolerance: Patient tolerated treatment well Patient left: in chair;with call bell/phone within reach;with chair alarm set;with family/visitor present   Time: 8119-1478 OT Time Calculation (min): 23 min Charges:  OT General Charges $OT Visit: 1 Procedure OT Evaluation $OT Eval Moderate Complexity: 1 Procedure G-Codes:     Gaye Alken M.S., OTR/L Pager: [phone]  10/06/2015, 9:48 AM

## 2015-10-07 ENCOUNTER — Inpatient Hospital Stay (HOSPITAL_COMMUNITY)
Admission: RE | Admit: 2015-10-07 | Discharge: 2015-10-27 | DRG: 057 | Disposition: A | Discharge status: Skilled Nursing Facility | Payer: Medicare Other | Source: Intra-hospital | Attending: Physical Medicine & Rehabilitation | Admitting: Physical Medicine & Rehabilitation

## 2015-10-07 DIAGNOSIS — F0390 Unspecified dementia without behavioral disturbance: Secondary | ICD-10-CM | POA: Diagnosis not present

## 2015-10-07 DIAGNOSIS — G4700 Insomnia, unspecified: Secondary | ICD-10-CM | POA: Insufficient documentation

## 2015-10-07 DIAGNOSIS — N400 Enlarged prostate without lower urinary tract symptoms: Secondary | ICD-10-CM | POA: Diagnosis not present

## 2015-10-07 DIAGNOSIS — F05 Delirium due to known physiological condition: Secondary | ICD-10-CM | POA: Insufficient documentation

## 2015-10-07 DIAGNOSIS — I10 Essential (primary) hypertension: Secondary | ICD-10-CM | POA: Insufficient documentation

## 2015-10-07 DIAGNOSIS — E785 Hyperlipidemia, unspecified: Secondary | ICD-10-CM

## 2015-10-07 DIAGNOSIS — L89151 Pressure ulcer of sacral region, stage 1: Secondary | ICD-10-CM | POA: Diagnosis not present

## 2015-10-07 DIAGNOSIS — Z7982 Long term (current) use of aspirin: Secondary | ICD-10-CM

## 2015-10-07 DIAGNOSIS — L89211 Pressure ulcer of right hip, stage 1: Secondary | ICD-10-CM | POA: Diagnosis not present

## 2015-10-07 DIAGNOSIS — I69354 Hemiplegia and hemiparesis following cerebral infarction affecting left non-dominant side: Principal | ICD-10-CM

## 2015-10-07 DIAGNOSIS — Z79899 Other long term (current) drug therapy: Secondary | ICD-10-CM

## 2015-10-07 DIAGNOSIS — M62838 Other muscle spasm: Secondary | ICD-10-CM | POA: Diagnosis not present

## 2015-10-07 DIAGNOSIS — I69322 Dysarthria following cerebral infarction: Secondary | ICD-10-CM

## 2015-10-07 DIAGNOSIS — Z7984 Long term (current) use of oral hypoglycemic drugs: Secondary | ICD-10-CM

## 2015-10-07 DIAGNOSIS — I69991 Dysphagia following unspecified cerebrovascular disease: Secondary | ICD-10-CM | POA: Diagnosis not present

## 2015-10-07 DIAGNOSIS — L89611 Pressure ulcer of right heel, stage 1: Secondary | ICD-10-CM

## 2015-10-07 DIAGNOSIS — L89221 Pressure ulcer of left hip, stage 1: Secondary | ICD-10-CM | POA: Diagnosis not present

## 2015-10-07 DIAGNOSIS — R682 Dry mouth, unspecified: Secondary | ICD-10-CM | POA: Diagnosis not present

## 2015-10-07 DIAGNOSIS — I6381 Other cerebral infarction due to occlusion or stenosis of small artery: Secondary | ICD-10-CM | POA: Diagnosis present

## 2015-10-07 DIAGNOSIS — R1310 Dysphagia, unspecified: Secondary | ICD-10-CM | POA: Diagnosis not present

## 2015-10-07 DIAGNOSIS — I639 Cerebral infarction, unspecified: Secondary | ICD-10-CM

## 2015-10-07 DIAGNOSIS — L89621 Pressure ulcer of left heel, stage 1: Secondary | ICD-10-CM

## 2015-10-07 DIAGNOSIS — E1142 Type 2 diabetes mellitus with diabetic polyneuropathy: Secondary | ICD-10-CM | POA: Insufficient documentation

## 2015-10-07 DIAGNOSIS — N179 Acute kidney failure, unspecified: Secondary | ICD-10-CM | POA: Diagnosis not present

## 2015-10-07 DIAGNOSIS — G8194 Hemiplegia, unspecified affecting left nondominant side: Secondary | ICD-10-CM | POA: Insufficient documentation

## 2015-10-07 DIAGNOSIS — I69398 Other sequelae of cerebral infarction: Secondary | ICD-10-CM | POA: Insufficient documentation

## 2015-10-07 DIAGNOSIS — I69391 Dysphagia following cerebral infarction: Secondary | ICD-10-CM

## 2015-10-07 DIAGNOSIS — IMO0002 Reserved for concepts with insufficient information to code with codable children: Secondary | ICD-10-CM | POA: Insufficient documentation

## 2015-10-07 DIAGNOSIS — R269 Unspecified abnormalities of gait and mobility: Secondary | ICD-10-CM

## 2015-10-07 LAB — CBC WITH DIFFERENTIAL/PLATELET
BASOS ABS: 0 10*3/uL (ref 0.0–0.1)
BASOS PCT: 1 %
Eosinophils Absolute: 0.2 10*3/uL (ref 0.0–0.7)
Eosinophils Relative: 2 %
HEMATOCRIT: 38 % — AB (ref 39.0–52.0)
HEMOGLOBIN: 12 g/dL — AB (ref 13.0–17.0)
Lymphocytes Relative: 14 %
Lymphs Abs: 1 10*3/uL (ref 0.7–4.0)
MCH: 28.4 pg (ref 26.0–34.0)
MCHC: 31.6 g/dL (ref 30.0–36.0)
MCV: 89.8 fL (ref 78.0–100.0)
MONO ABS: 0.6 10*3/uL (ref 0.1–1.0)
Monocytes Relative: 8 %
NEUTROS ABS: 5.8 10*3/uL (ref 1.7–7.7)
NEUTROS PCT: 75 %
Platelets: 173 10*3/uL (ref 150–400)
RBC: 4.23 MIL/uL (ref 4.22–5.81)
RDW: 15.2 % (ref 11.5–15.5)
WBC: 7.6 10*3/uL (ref 4.0–10.5)

## 2015-10-07 LAB — GLUCOSE, CAPILLARY
GLUCOSE-CAPILLARY: 250 mg/dL — AB (ref 65–99)
Glucose-Capillary: 214 mg/dL — ABNORMAL HIGH (ref 65–99)
Glucose-Capillary: 189 mg/dL — ABNORMAL HIGH (ref 65–99)
Glucose-Capillary: 272 mg/dL — ABNORMAL HIGH (ref 65–99)

## 2015-10-07 LAB — COMPREHENSIVE METABOLIC PANEL
ALBUMIN: 3.5 g/dL (ref 3.5–5.0)
ALT: 14 U/L — AB (ref 17–63)
AST: 27 U/L (ref 15–41)
Alkaline Phosphatase: 71 U/L (ref 38–126)
Anion gap: 10 (ref 5–15)
BILIRUBIN TOTAL: 0.5 mg/dL (ref 0.3–1.2)
BUN: 29 mg/dL — AB (ref 6–20)
CO2: 28 mmol/L (ref 22–32)
Calcium: 9.6 mg/dL (ref 8.9–10.3)
Chloride: 107 mmol/L (ref 101–111)
Creatinine, Ser: 1.47 mg/dL — ABNORMAL HIGH (ref 0.61–1.24)
GFR calc Af Amer: 49 mL/min — ABNORMAL LOW (ref >60)
GFR calc non Af Amer: 43 mL/min — ABNORMAL LOW (ref >60)
GLUCOSE: 334 mg/dL — AB (ref 65–99)
POTASSIUM: 4.3 mmol/L (ref 3.5–5.1)
Sodium: 145 mmol/L (ref 135–145)
TOTAL PROTEIN: 6.4 g/dL — AB (ref 6.5–8.1)

## 2015-10-07 MED ORDER — MEMANTINE HCL 10 MG PO TABS: 10.0000 mg | ORAL_TABLET | Freq: Every day | ORAL | Status: DC

## 2015-10-07 MED ORDER — CLOPIDOGREL BISULFATE 75 MG PO TABS
75.0000 mg | ORAL_TABLET | Freq: Every day | ORAL | Status: DC
  Administered 2015-10-08 – 2015-10-27 (×20): 75 mg via ORAL
  Filled 2015-10-07 (×21): qty 1

## 2015-10-07 MED ORDER — ATORVASTATIN CALCIUM 20 MG PO TABS: 20.0000 mg | ORAL_TABLET | Freq: Every day | ORAL | Status: DC

## 2015-10-07 MED ORDER — ATORVASTATIN CALCIUM 20 MG PO TABS: 20.0000 mg | ORAL_TABLET | Freq: Every day | ORAL | Status: AC

## 2015-10-07 MED ORDER — CHLORHEXIDINE GLUCONATE 0.12 % MT SOLN
15.0000 mL | Freq: Two times a day (BID) | OROMUCOSAL | Status: DC
  Administered 2015-10-08 – 2015-10-27 (×35): 15 mL via OROMUCOSAL
  Filled 2015-10-07 (×38): qty 15

## 2015-10-07 MED ORDER — ATORVASTATIN CALCIUM 20 MG PO TABS
20.0000 mg | ORAL_TABLET | Freq: Every day | ORAL | Status: DC
  Administered 2015-10-07 – 2015-10-26 (×19): 20 mg via ORAL
  Filled 2015-10-07 (×19): qty 1

## 2015-10-07 MED ORDER — COLCHICINE 0.6 MG PO TABS
0.6000 mg | ORAL_TABLET | Freq: Every day | ORAL | Status: DC
  Administered 2015-10-08 – 2015-10-27 (×20): 0.6 mg via ORAL
  Filled 2015-10-07 (×21): qty 1

## 2015-10-07 MED ORDER — DOCUSATE SODIUM 100 MG PO CAPS
200.0000 mg | ORAL_CAPSULE | Freq: Every day | ORAL | Status: DC
  Administered 2015-10-07 – 2015-10-16 (×9): 200 mg via ORAL
  Filled 2015-10-07 (×10): qty 2

## 2015-10-07 MED ORDER — FINASTERIDE 5 MG PO TABS
5.0000 mg | ORAL_TABLET | Freq: Every day | ORAL | Status: DC
  Administered 2015-10-07 – 2015-10-27 (×21): 5 mg via ORAL
  Filled 2015-10-07 (×22): qty 1

## 2015-10-07 MED ORDER — ACETAMINOPHEN 325 MG PO TABS
325.0000 mg | ORAL_TABLET | ORAL | Status: DC | PRN
  Administered 2015-10-08: 650 mg via ORAL
  Filled 2015-10-07: qty 2

## 2015-10-07 MED ORDER — DOCUSATE SODIUM 100 MG PO CAPS: 200.0000 mg | ORAL_CAPSULE | Freq: Every day | ORAL | Status: AC

## 2015-10-07 MED ORDER — ONDANSETRON HCL 4 MG/2ML IJ SOLN: 4.0000 mg | Freq: Four times a day (QID) | INTRAMUSCULAR | Status: DC | PRN

## 2015-10-07 MED ORDER — CETYLPYRIDINIUM CHLORIDE 0.05 % MT LIQD
7.0000 mL | Freq: Two times a day (BID) | OROMUCOSAL | Status: DC
  Administered 2015-10-08 – 2015-10-26 (×32): 7 mL via OROMUCOSAL

## 2015-10-07 MED ORDER — POLYETHYLENE GLYCOL 3350 17 G PO PACK
17.0000 g | PACK | Freq: Every day | ORAL | Status: DC | PRN
  Administered 2015-10-08 – 2015-10-27 (×2): 17 g via ORAL
  Filled 2015-10-07 (×2): qty 1

## 2015-10-07 MED ORDER — CLOPIDOGREL BISULFATE 75 MG PO TABS: 75.0000 mg | ORAL_TABLET | Freq: Every day | ORAL | Status: AC

## 2015-10-07 MED ORDER — INSULIN ASPART 100 UNIT/ML ~~LOC~~ SOLN
0.0000 [IU] | Freq: Three times a day (TID) | SUBCUTANEOUS | Status: DC
  Administered 2015-10-07 – 2015-10-08 (×3): 5 [IU] via SUBCUTANEOUS
  Administered 2015-10-08: 3 [IU] via SUBCUTANEOUS
  Administered 2015-10-09 (×3): 5 [IU] via SUBCUTANEOUS
  Administered 2015-10-10: 2 [IU] via SUBCUTANEOUS
  Administered 2015-10-10: 5 [IU] via SUBCUTANEOUS
  Administered 2015-10-10: 2 [IU] via SUBCUTANEOUS
  Administered 2015-10-11: 3 [IU] via SUBCUTANEOUS
  Administered 2015-10-12: 2 [IU] via SUBCUTANEOUS
  Administered 2015-10-12: 7 [IU] via SUBCUTANEOUS
  Administered 2015-10-12 – 2015-10-13 (×4): 1 [IU] via SUBCUTANEOUS
  Administered 2015-10-14: 2 [IU] via SUBCUTANEOUS
  Administered 2015-10-14: 1 [IU] via SUBCUTANEOUS
  Administered 2015-10-14 – 2015-10-16 (×4): 2 [IU] via SUBCUTANEOUS
  Administered 2015-10-17: 1 [IU] via SUBCUTANEOUS
  Administered 2015-10-17: 2 [IU] via SUBCUTANEOUS
  Administered 2015-10-17: 1 [IU] via SUBCUTANEOUS
  Administered 2015-10-18: 3 [IU] via SUBCUTANEOUS
  Administered 2015-10-19 – 2015-10-20 (×3): 2 [IU] via SUBCUTANEOUS
  Administered 2015-10-21: 1 [IU] via SUBCUTANEOUS
  Administered 2015-10-21: 2 [IU] via SUBCUTANEOUS
  Administered 2015-10-22: 1 [IU] via SUBCUTANEOUS
  Administered 2015-10-22: 2 [IU] via SUBCUTANEOUS
  Administered 2015-10-23: 3 [IU] via SUBCUTANEOUS
  Administered 2015-10-24: 1 [IU] via SUBCUTANEOUS
  Administered 2015-10-24: 2 [IU] via SUBCUTANEOUS
  Administered 2015-10-24 – 2015-10-26 (×2): 1 [IU] via SUBCUTANEOUS

## 2015-10-07 MED ORDER — ONDANSETRON HCL 4 MG PO TABS
4.0000 mg | ORAL_TABLET | Freq: Four times a day (QID) | ORAL | Status: DC | PRN
  Administered 2015-10-08: 4 mg via ORAL
  Filled 2015-10-07: qty 1

## 2015-10-07 MED ORDER — HEPARIN SODIUM (PORCINE) 5000 UNIT/ML IJ SOLN
5000.0000 [IU] | Freq: Three times a day (TID) | INTRAMUSCULAR | Status: DC
  Administered 2015-10-07 – 2015-10-27 (×59): 5000 [IU] via SUBCUTANEOUS
  Filled 2015-10-07 (×60): qty 1

## 2015-10-07 MED ORDER — HEPARIN SODIUM (PORCINE) 5000 UNIT/ML IJ SOLN: 5000.0000 [IU] | Freq: Three times a day (TID) | INTRAMUSCULAR | Status: DC

## 2015-10-07 MED ORDER — SORBITOL 70 % SOLN
30.0000 mL | Freq: Every day | Status: DC | PRN
Start: 2015-10-07 — End: 2015-10-27
  Administered 2015-10-11: 30 mL via ORAL
  Filled 2015-10-07: qty 30

## 2015-10-07 NOTE — Discharge Summary (Signed)
Physician Discharge Summary  Roger [name] ZOX:[id number] DOB: Aug 21, 1930 DOA: 10/04/2015  PCP: No primary care provider on file.  Admit date: 10/04/2015 Discharge date: 10/07/2015  Time spent: > 35 minutes  Recommendations for Outpatient Follow-up:  1. Please f/u with your primary care physician and neurology 2. Monitor serum creatinine while on metformin within the next 1 week   Discharge Diagnoses:  Principal Problem:   Slurred speech Active Problems:   Malignant hypertension   Diabetes (HCC)   Stroke (HCC)   HLD (hyperlipidemia)   Discharge Condition: stable  Diet recommendation: diabetic diet  There were no vitals filed for this visit.  History of present illness:  82 y.o. male history of hypertension, diabetes mellitus, Alzheimer's dementia, presenting with new onset slurred speech, left facial droop and left sided weakness. He was leaning heavily on the left side.   Hospital Course:   Stroke: Non-dominant right BG infarct, likely secondary to small vessel disease source  Resultant Left facial and left sided weakness  MRI Right BG infarct  CTA head and neck - diffuse intracranial athero, but no LVO  2D Echo reviewed results  LDL 125  HgbA1c 8.1   DIET - DYS 1  aspirin 81 mg daily prior to admission, now on clopidogrel 75 mg daily. Continue plavix on discharge  Ongoing aggressive stroke risk factor management  Therapy recommendations: CIR  Hypertension  Will allow for Permissive hypertension (OK if < 220/120) but gradually normalize in 5-7 days  Hyperlipidemia  Home meds: No meds  LDL 125, goal < 70  Add lipitor 20mg  daily  Continue statin at discharge  Diabetes  hgba1c reviewed. Will continue metformin while serum creatinine less than 1.5  Other Stroke Risk Factors  Advanced age  Procedures:  Please see below  Consultations:  Neurology  Discharge Exam: Filed Vitals:   10/07/15 0531 10/07/15 0959  BP: 155/75 151/78   Pulse: 73   Temp: 98.1 F (36.7 C) 98 F (36.7 C)  Resp: 20 20    General: Pt in nad, alert and awake Cardiovascular: rrr, no rubs Respiratory: no increased wob no wheezes  Discharge Instructions   Discharge Instructions    Ambulatory referral to Neurology    Complete by:  As directed   Follow up with carolyn Daphine Deutscher NP at [facility] in one month. Thanks.     Call MD for:  difficulty breathing, headache or visual disturbances    Complete by:  As directed      Call MD for:  temperature >100.4    Complete by:  As directed      Diet - low sodium heart healthy    Complete by:  As directed      Increase activity slowly    Complete by:  As directed           Current Discharge Medication List    START taking these medications   Details  atorvastatin (LIPITOR) 20 MG tablet Take 1 tablet (20 mg total) by mouth daily at 6 PM. Qty: 30 tablet, Refills: 0    clopidogrel (PLAVIX) 75 MG tablet Take 1 tablet (75 mg total) by mouth daily. Qty: 30 tablet, Refills: 0    docusate sodium (COLACE) 100 MG capsule Take 2 capsules (200 mg total) by mouth at bedtime. Qty: 10 capsule, Refills: 0      CONTINUE these medications which have NOT CHANGED   Details  B Complex-C (B-COMPLEX WITH VITAMIN C) tablet Take 1 tablet by mouth daily.    cholecalciferol (VITAMIN  D) 1000 units tablet Take 1,000 Units by mouth daily.    COLCRYS 0.6 MG tablet Take 0.6 mg by mouth daily as needed (gout flare). Reported on 10/04/2015    finasteride (PROSCAR) 5 MG tablet Take 5 mg by mouth daily at 12 noon.    metFORMIN (GLUCOPHAGE-XR) 500 MG 24 hr tablet Take 500 mg by mouth 2 (two) times daily.     tamsulosin (FLOMAX) 0.4 MG CAPS capsule Take 0.4 mg by mouth at bedtime.    vitamin B-12 (CYANOCOBALAMIN) 1000 MCG tablet Take 1,000 mcg by mouth daily.      STOP taking these medications     aspirin EC 81 MG tablet      losartan (COZAAR) 100 MG tablet      Memantine HCl ER (NAMENDA XR) 7 MG CP24         Allergies  Allergen Reactions  . Aricept [Donepezil Hcl]     Nightmares, Hallucinations  . Namenda [Memantine Hcl]     Nightmares, Hallucinations   Follow-up Information    Follow up with Nilda Riggs, NP. Schedule an appointment as soon as possible for a visit in 1 month.   Specialty:  Family Medicine   Why:  stroke clinic   Contact information:   [address] [place] [address] [phone]        The results of significant diagnostics from this hospitalization (including imaging, microbiology, ancillary and laboratory) are listed below for reference.    Significant Diagnostic Studies: Ct Angio Head W/cm &/or Wo Cm  10/05/2015  CLINICAL DATA:  Stroke. EXAM: CT ANGIOGRAPHY HEAD AND NECK TECHNIQUE: Multidetector CT imaging of the head and neck was performed using the standard protocol during bolus administration of intravenous contrast. Multiplanar CT image reconstructions and MIPs were obtained to evaluate the vascular anatomy. Carotid stenosis measurements (when applicable) are obtained utilizing NASCET criteria, using the distal internal carotid diameter as the denominator. CONTRAST:  50mL OMNIPAQUE IOHEXOL 350 MG/ML SOLN COMPARISON:  Head CT from yesterday FINDINGS: CTA NECK Aortic arch: No aneurysm or dissection. 2 vessel branching. Extensive atherosclerotic plaque mixed area Right carotid system: Diffuse atheromatous wall thickening. No flow limiting (50% or greater) stenosis. No dissection. Left carotid system:Diffuse atheromatous wall thickening. No flow limiting (50% or greater) stenosis. Vertebral arteries: No dissection. No proximal subclavian flow limiting stenosis. Left vertebral artery dominance. No evidence of dissection. High-grade right ostial stenosis. Skeleton: No contributory findings Other neck: Patchy ground-glass and nodular opacities in the right upper lobe with a infectious/ inflammatory appearance CTA HEAD Anterior circulation: Symmetric  carotid arteries. Hypoplastic right A1 segment with anterior communicating artery. Bilateral posterior communicating arteries. No proximal occlusion or flow limiting stenosis. Atherosclerotic calcification on the carotid siphons. High-grade distal left M2 branch stenosis best seen on coronal reformats image 106. Elsewhere atherosclerotic irregularity and narrowing is mild to moderate. Posterior circulation: Left dominant. Early branching right PICA. The right V4 segment diffuse atherosclerotic type irregularity, with minimal if any contribution to the basilar. These narrowings are beyond the PICA origin. Atherosclerotic irregularity of the basilar which is narrow in the setting of sizable posterior communicating arteries. No major branch occlusion. Congenitally small left P1 segment with superimposed atherosclerotic irregularity. Best seen on coronal reformats there is moderate stenosis of right P4 branch. Negative for aneurysm. Venous sinuses: Patent Anatomic variants: Intact circle-of-Willis Delayed phase: Not performed IMPRESSION: 1. No acute arterial finding. 2. Cervical and vertebral artery atherosclerosis. High-grade non dominant right vertebral ostium and V4 stenoses. 3. Intracranial atherosclerosis  as described. 4. Patchy opacity in the right lung with inflammatory appearance. Correlate for symptoms of active pneumonitis/pneumonia. Electronically Signed   By: Marnee Spring M.D.   On: 10/05/2015 11:31   Dg Chest 2 View  10/04/2015  CLINICAL DATA:  Stroke symptoms since 1100 hrs today, hx diabetic, hx hypertension EXAM: CHEST  2 VIEW COMPARISON:  08/12/2015 FINDINGS: Cardiac silhouette normal in size and configuration. No mediastinal or hilar masses or evidence of adenopathy. Previously seen airspace opacities have resolved. Lungs are mildly hyperexpanded. There are mildly prominent bronchovascular markings which are stable. No evidence of pneumonia or edema. No pleural effusion or pneumothorax. Bony  thorax is demineralized but grossly intact. IMPRESSION: No acute cardiopulmonary disease. Electronically Signed   By: Amie Portland M.D.   On: 10/04/2015 17:58   Ct Head Wo Contrast  10/04/2015  ADDENDUM REPORT: 10/04/2015 15:18 ADDENDUM: Return call at 3:18 p.m. with direct communication with Dr. Lavon Paganini. Electronically Signed   By: Jeronimo Greaves M.D.   On: 10/04/2015 15:18  10/04/2015  CLINICAL DATA:  Slurred speech. Left-sided facial droop. Hypertension and diabetes. EXAM: CT HEAD WITHOUT CONTRAST TECHNIQUE: Contiguous axial images were obtained from the base of the skull through the vertex without intravenous contrast. COMPARISON:  MRI of 03/12/2014. FINDINGS: Sinuses/Soft tissues: Surgical changes about both globes. Minimal mucosal thickening of ethmoid air cells. Hypoplastic frontal sinuses. Clear mastoid air cells. Intracranial: Expected cerebral volume loss for age. Mild for age low density in the periventricular white matter likely related to small vessel disease. Bilateral vertebral and carotid intracranial atherosclerosis. No mass lesion, hemorrhage, hydrocephalus, acute infarct, intra-axial, or extra-axial fluid collection. IMPRESSION: No acute intracranial abnormality. The referring physician was paged at 3 p.m. and a return call is pending. Electronically Signed: By: Jeronimo Greaves M.D. On: 10/04/2015 15:01   Ct Angio Neck W/cm &/or Wo/cm  10/05/2015  CLINICAL DATA:  Stroke. EXAM: CT ANGIOGRAPHY HEAD AND NECK TECHNIQUE: Multidetector CT imaging of the head and neck was performed using the standard protocol during bolus administration of intravenous contrast. Multiplanar CT image reconstructions and MIPs were obtained to evaluate the vascular anatomy. Carotid stenosis measurements (when applicable) are obtained utilizing NASCET criteria, using the distal internal carotid diameter as the denominator. CONTRAST:  50mL OMNIPAQUE IOHEXOL 350 MG/ML SOLN COMPARISON:  Head CT from yesterday FINDINGS: CTA  NECK Aortic arch: No aneurysm or dissection. 2 vessel branching. Extensive atherosclerotic plaque mixed area Right carotid system: Diffuse atheromatous wall thickening. No flow limiting (50% or greater) stenosis. No dissection. Left carotid system:Diffuse atheromatous wall thickening. No flow limiting (50% or greater) stenosis. Vertebral arteries: No dissection. No proximal subclavian flow limiting stenosis. Left vertebral artery dominance. No evidence of dissection. High-grade right ostial stenosis. Skeleton: No contributory findings Other neck: Patchy ground-glass and nodular opacities in the right upper lobe with a infectious/ inflammatory appearance CTA HEAD Anterior circulation: Symmetric carotid arteries. Hypoplastic right A1 segment with anterior communicating artery. Bilateral posterior communicating arteries. No proximal occlusion or flow limiting stenosis. Atherosclerotic calcification on the carotid siphons. High-grade distal left M2 branch stenosis best seen on coronal reformats image 106. Elsewhere atherosclerotic irregularity and narrowing is mild to moderate. Posterior circulation: Left dominant. Early branching right PICA. The right V4 segment diffuse atherosclerotic type irregularity, with minimal if any contribution to the basilar. These narrowings are beyond the PICA origin. Atherosclerotic irregularity of the basilar which is narrow in the setting of sizable posterior communicating arteries. No major branch occlusion. Congenitally small left P1 segment with superimposed atherosclerotic  irregularity. Best seen on coronal reformats there is moderate stenosis of right P4 branch. Negative for aneurysm. Venous sinuses: Patent Anatomic variants: Intact circle-of-Willis Delayed phase: Not performed IMPRESSION: 1. No acute arterial finding. 2. Cervical and vertebral artery atherosclerosis. High-grade non dominant right vertebral ostium and V4 stenoses. 3. Intracranial atherosclerosis as described. 4.  Patchy opacity in the right lung with inflammatory appearance. Correlate for symptoms of active pneumonitis/pneumonia. Electronically Signed   By: Marnee Spring M.D.   On: 10/05/2015 11:31   Mr Brain Wo Contrast  10/05/2015  CLINICAL DATA:  82 year old hypertensive diabetic male with facial asymmetry, slurred speech and left-sided weakness. Subsequent encounter. EXAM: MRI HEAD WITHOUT CONTRAST TECHNIQUE: Multiplanar, multiecho pulse sequences of the brain and surrounding structures were obtained without intravenous contrast. COMPARISON:  CT angiogram 10/05/2015. Head CT 10/04/2015. Brain MR 03/12/2014. FINDINGS: Exam is motion degraded. Moderate-size acute nonhemorrhagic infarct extends from the posterior right lenticular nucleus extending to posterior right corona radiata. Tiny area blood breakdown products posterior left frontal lobe most consistent with result prior hemorrhagic ischemia. Remote small cerebellar infarcts bilaterally, right paracentral pontine infarct, basal ganglia infarcts, left thalamic infarct and small infarcts centrum semi ovale greater on the right. Moderate chronic microvascular changes. Global moderate atrophy without hydrocephalus. No intracranial mass lesion noted on this unenhanced exam. Abnormal appearance right vertebral artery. Please see CT angiogram report. Post lens replacement. Cervical medullary junction and pineal region unremarkable. IMPRESSION: Moderate-size acute nonhemorrhagic infarct extends from the posterior right lenticular nucleus extending to posterior right corona radiata. Remote small cerebellar infarcts bilaterally, right paracentral pontine infarct, basal ganglia infarcts, left thalamic infarct and small infarcts centrum semi ovale greater on the right. Moderate chronic microvascular changes. Global moderate atrophy without hydrocephalus. Abnormal appearance right vertebral artery. Please see CT angiogram report. Electronically Signed   By: Lacy Duverney M.D.    On: 10/05/2015 13:33   Dg Swallowing Func-speech Pathology  10/05/2015  Objective Swallowing Evaluation: Type of Study: MBS-Modified Barium Swallow Study Patient Details Name: Roger [name] MRN: [id number] Date of Birth: 22-Sep-1930 Today's Date: 10/05/2015 Time: SLP Start Time (ACUTE ONLY): 1056-SLP Stop Time (ACUTE ONLY): 1119 SLP Time Calculation (min) (ACUTE ONLY): 23 min Past Medical History: Past Medical History Diagnosis Date . Diabetes (HCC)  . Hypertension  . Gout  . ED (erectile dysfunction)  . Heart murmur  . Kidney stones  . Dysphagia  Past Surgical History: Past Surgical History Procedure Laterality Date . Tonsillectomy   . Appendectomy   . Lithotripsy   . Uvulopalatopharyngoplasty     for snoring HPI: Pt is an 82 y/o male who presents with slurred speech, L facial droop and L sided weakness. CT was negative for acute changes, and MRI is currently pending.  Subjective: patient upright in bed with daughter at bedside Assessment / Plan / Recommendation CHL IP CLINICAL IMPRESSIONS 10/05/2015 Therapy Diagnosis Severe oral phase dysphagia;Moderate pharyngeal phase dysphagia;Other (Comment) Clinical Impression Patient presents with baseline mild dysphagia, which is now compounded by new onset of left sided weakness.  This results in primarily motor based deficits.  Mastication of soft solids is prolonged and results in severe vallecular residue.  When textures are modified to Dys.1 textures patient able to reduce residue with 3 swallows.  Nectar-thick liquids allowed extra time for sluggish swallow response and modification of use of teaspoon ensured small portions which resulted in penetration that either flashed or was sensed and effectively cleared pharynx with throat clears. Recommend initiation of Dys.1 textures and nectar-thick liquids with  full supervision and strict use of modifications and safe swallow strategies to maximize swallow safety.  Education completed with teach back from patient, daughter  and wife.  SLP will follow acutely and daughter requests inpatient rehab for follow up therapies; SLP in agreement.        Impact on safety and function Mild aspiration risk   CHL IP TREATMENT RECOMMENDATION 10/05/2015 Treatment Recommendations Therapy as outlined in treatment plan below   Prognosis 10/05/2015 Prognosis for Safe Diet Advancement Good Barriers to Reach Goals Cognitive deficits;Time post onset;Severity of deficits Barriers/Prognosis Comment -- CHL IP DIET RECOMMENDATION 10/05/2015 SLP Diet Recommendations Dysphagia 1 (Puree) solids;Nectar thick liquid Liquid Administration via Spoon Medication Administration Crushed with puree Compensations Minimize environmental distractions;Slow rate;Small sips/bites;Multiple dry swallows after each bite/sip;Follow solids with liquid;Clear throat intermittently Postural Changes Seated upright at 90 degrees;Remain semi-upright after after feeds/meals (Comment)   CHL IP OTHER RECOMMENDATIONS 10/05/2015 Recommended Consults -- Oral Care Recommendations Oral care BID Other Recommendations Order thickener from pharmacy;Prohibited food (jello, ice cream, thin soups);Remove water pitcher;Have oral suction available   CHL IP FOLLOW UP RECOMMENDATIONS 10/05/2015 Follow up Recommendations 24 hour supervision/assistance;Inpatient Rehab   CHL IP FREQUENCY AND DURATION 10/05/2015 Speech Therapy Frequency (ACUTE ONLY) min 2x/week Treatment Duration 2 weeks      CHL IP ORAL PHASE 10/05/2015 Oral Phase Impaired Oral - Pudding Teaspoon -- Oral - Pudding Cup -- Oral - Honey Teaspoon -- Oral - Honey Cup -- Oral - Nectar Teaspoon Weak lingual manipulation;Incomplete tongue to palate contact;Reduced posterior propulsion;Holding of bolus;Lingual/palatal residue;Piecemeal swallowing;Delayed oral transit;Decreased bolus cohesion;Premature spillage Oral - Nectar Cup Left anterior bolus loss;Weak lingual manipulation;Lingual/palatal residue;Piecemeal swallowing;Delayed oral transit;Decreased bolus  cohesion;Premature spillage Oral - Nectar Straw -- Oral - Thin Teaspoon -- Oral - Thin Cup Left anterior bolus loss;Weak lingual manipulation;Incomplete tongue to palate contact;Reduced posterior propulsion;Holding of bolus;Lingual/palatal residue;Piecemeal swallowing;Delayed oral transit;Decreased bolus cohesion;Premature spillage Oral - Thin Straw -- Oral - Puree Weak lingual manipulation;Incomplete tongue to palate contact;Reduced posterior propulsion;Holding of bolus;Lingual/palatal residue;Piecemeal swallowing;Delayed oral transit;Premature spillage Oral - Mech Soft Impaired mastication;Weak lingual manipulation;Incomplete tongue to palate contact;Reduced posterior propulsion;Holding of bolus;Left pocketing in lateral sulci;Lingual/palatal residue;Piecemeal swallowing;Delayed oral transit;Premature spillage Oral - Regular -- Oral - Multi-Consistency -- Oral - Pill -- Oral Phase - Comment --  CHL IP PHARYNGEAL PHASE 10/05/2015 Pharyngeal Phase Impaired Pharyngeal- Pudding Teaspoon -- Pharyngeal -- Pharyngeal- Pudding Cup -- Pharyngeal -- Pharyngeal- Honey Teaspoon -- Pharyngeal -- Pharyngeal- Honey Cup -- Pharyngeal -- Pharyngeal- Nectar Teaspoon Delayed swallow initiation-vallecula;Reduced epiglottic inversion;Reduced tongue base retraction;Reduced laryngeal elevation;Reduced airway/laryngeal closure;Penetration/Aspiration during swallow;Pharyngeal residue - valleculae;Pharyngeal residue - pyriform Pharyngeal Material enters airway, remains ABOVE vocal cords then ejected out;Material enters airway, remains ABOVE vocal cords and not ejected out;Other (Comment) Pharyngeal- Nectar Cup Delayed swallow initiation-pyriform sinuses;Reduced epiglottic inversion;Reduced laryngeal elevation;Reduced airway/laryngeal closure;Reduced tongue base retraction;Penetration/Aspiration before swallow;Penetration/Aspiration during swallow;Penetration/Apiration after swallow;Moderate aspiration;Pharyngeal residue -  valleculae;Pharyngeal residue - pyriform Pharyngeal Material enters airway, CONTACTS cords and not ejected out;Other (Comment);Material enters airway, passes BELOW cords and not ejected out despite cough attempt by patient Pharyngeal- Nectar Straw -- Pharyngeal -- Pharyngeal- Thin Teaspoon -- Pharyngeal -- Pharyngeal- Thin Cup Delayed swallow initiation-pyriform sinuses;Reduced epiglottic inversion;Reduced laryngeal elevation;Reduced airway/laryngeal closure;Reduced tongue base retraction;Penetration/Aspiration before swallow;Penetration/Aspiration during swallow;Penetration/Apiration after swallow;Moderate aspiration;Pharyngeal residue - valleculae;Pharyngeal residue - pyriform Pharyngeal Material enters airway, passes BELOW cords and not ejected out despite cough attempt by patient Pharyngeal- Thin Straw -- Pharyngeal -- Pharyngeal- Puree Delayed swallow initiation-vallecula;Reduced epiglottic inversion;Reduced laryngeal elevation;Reduced tongue base retraction;Pharyngeal residue -  valleculae;Pharyngeal residue - pyriform;Other (Comment) Pharyngeal -- Pharyngeal- Mechanical Soft Delayed swallow initiation-vallecula;Reduced laryngeal elevation;Reduced epiglottic inversion;Reduced tongue base retraction;Pharyngeal residue - valleculae;Pharyngeal residue - pyriform;Other (Comment) Pharyngeal -- Pharyngeal- Regular -- Pharyngeal -- Pharyngeal- Multi-consistency -- Pharyngeal -- Pharyngeal- Pill -- Pharyngeal -- Pharyngeal Comment --  CHL IP CERVICAL ESOPHAGEAL PHASE 10/05/2015 Cervical Esophageal Phase WFL Pudding Teaspoon -- Pudding Cup -- Honey Teaspoon -- Honey Cup -- Nectar Teaspoon -- Nectar Cup -- Nectar Straw -- Thin Teaspoon -- Thin Cup -- Thin Straw -- Puree -- Mechanical Soft -- Regular -- Multi-consistency -- Pill -- Cervical Esophageal Comment -- CHL IP GO 03/12/2014 Functional Assessment Tool Used clinical judgement Functional Limitations Swallowing Swallow Current Status (W0981) CK Swallow Goal Status  (X9147) CK Swallow Discharge Status (W2956) CK Motor Speech Current Status (O1308) (None) Motor Speech Goal Status (M5784) (None) Motor Speech Goal Status (O9629) (None) Spoken Language Comprehension Current Status (B2841) (None) Spoken Language Comprehension Goal Status (L2440) (None) Spoken Language Comprehension Discharge Status (N0272) (None) Spoken Language Expression Current Status (Z3664) (None) Spoken Language Expression Goal Status (Q0347) (None) Spoken Language Expression Discharge Status [phone]) (None) Attention Current Status (G3875) (None) Attention Goal Status (I4332) (None) Attention Discharge Status (R5188) (None) Memory Current Status (C1660) (None) Memory Goal Status (Y3016) (None) Memory Discharge Status (W1093) (None) Voice Current Status (A3557) (None) Voice Goal Status (D2202) (None) Voice Discharge Status (R4270) (None) Other Speech-Language Pathology Functional Limitation (W2376) (None) Other Speech-Language Pathology Functional Limitation Goal Status (E8315) (None) Other Speech-Language Pathology Functional Limitation Discharge Status [phone]) (None) Charlane Ferretti., CCC-SLP [phone] BOWIE,MELISSA 10/05/2015, 2:49 PM               Microbiology: No results found for this or any previous visit (from the past 240 hour(s)).   Labs: Basic Metabolic Panel:  Recent Labs Lab 10/04/15 1454 10/04/15 1455  NA 144 142  K 4.2 4.1  CL 102 102  CO2 29  --   GLUCOSE 152* 144*  BUN 21* 27*  CREATININE 1.27* 1.30*  CALCIUM 9.7  --    Liver Function Tests:  Recent Labs Lab 10/04/15 1454  AST 33  ALT 13*  ALKPHOS 66  BILITOT 1.0  PROT 6.8  ALBUMIN 3.9   No results for input(s): LIPASE, AMYLASE in the last 168 hours. No results for input(s): AMMONIA in the last 168 hours. CBC:  Recent Labs Lab 10/04/15 1454 10/04/15 1455  WBC 6.3  --   NEUTROABS 4.5  --   HGB 12.8* 14.6  HCT 38.9* 43.0  MCV 89.6  --   PLT 193  --    Cardiac Enzymes: No results for input(s):  CKTOTAL, CKMB, CKMBINDEX, TROPONINI in the last 168 hours. BNP: BNP (last 3 results) No results for input(s): BNP in the last 8760 hours.  ProBNP (last 3 results) No results for input(s): PROBNP in the last 8760 hours.  CBG:  Recent Labs Lab 10/06/15 0612 10/06/15 1117 10/06/15 1647 10/06/15 2111 10/07/15 0640  GLUCAP 212* 187* 264* 183* 214*       Signed:  Penny Pia MD.  [facility] 10/07/2015, 10:57 AM

## 2015-10-07 NOTE — Progress Notes (Signed)
Patient admitted at approximately 1505 alert and oriented x 1 with wife and daughter. oriented patient and family to room and call bell system. Patient and family verbalized understanding of admission process.  Cleotilde Neer

## 2015-10-07 NOTE — Progress Notes (Signed)
Patient has home cpap unit. RT filled with sterile water and placed cpap mask on patient. Patient is resting comfortably. RT will monitor patient as needed.

## 2015-10-07 NOTE — Progress Notes (Signed)
Rehab admissions - I met with patient and his daughter this am.  Bed available and will admit to inpatient rehab today.  Family agreeable and have MD clearance.  Call me for questions.  #[phone]

## 2015-10-07 NOTE — Consult Note (Signed)
Erick Colace, MD Physician Signed Physical Medicine and Rehabilitation Consult Note 10/06/2015 6:23 AM  Related encounter: ED to Hosp-Admission (Current) from 10/04/2015 in [facility] 38M [facility]    Expand All Collapse All        Physical Medicine and Rehabilitation Consult Reason for Consult: Right basal ganglia infarct Referring Physician: Family medicine   HPI: Roger [name] is a 82 y.o. right handed male with history of hypertension, diabetes mellitus and dementia. Patient lives with spouse independent with assistive device prior to admission. One level home with 2 steps to entry. He was driving short distances prior to admission. Presented 10/04/2015 with left-sided weakness. MRI of the brain shows moderate size acute nonhemorrhagic infarct extending from the posterior right lenticular nuclear was extending into posterior right corona radiata. Remote small cerebellar infarcts bilaterally, right paracentral pontine infarct, basal ganglia infarct, left thalamic infarct and small infarcts Centrum semi-ovale greater on the right. CTA of the head with no acute arterial finding. Echocardiogram is pending. Patient did not receive TPA. Neurology consulted presently on Plavix for CVA prophylaxis. Subcutaneous heparin for DVT prophylaxis. Dysphagia #1 nectar thick liquid diet. Physical therapy evaluation completed 10/05/2015 with recommendations of physical medicine rehabilitation consult.  Patient has had some fluctuating mental status over the last several years according to his daughter. He seemed to get worse and then improve over weeks or months. He was still driving on a restricted license Less than 15 miles and no Interstate driving.  Review of Systems  Unable to perform ROS: mental acuity   Past Medical History  Diagnosis Date  . Diabetes (HCC)   . Hypertension   . Gout   . ED (erectile dysfunction)   . Heart murmur   . Kidney stones     . Dysphagia    Past Surgical History  Procedure Laterality Date  . Tonsillectomy    . Appendectomy    . Lithotripsy    . Uvulopalatopharyngoplasty      for snoring   Family History  Problem Relation Age of Onset  . Ataxia Neg Hx   . Chorea Neg Hx   . Dementia Neg Hx   . Mental retardation Neg Hx   . Migraines Neg Hx   . Multiple sclerosis Neg Hx   . Neurofibromatosis Neg Hx   . Neuropathy Neg Hx   . Parkinsonism Neg Hx   . Seizures Neg Hx   . Stroke Neg Hx    Social History:  reports that he has never smoked. He does not have any smokeless tobacco history on file. He reports that he does not drink alcohol or use illicit drugs. Allergies:  Allergies  Allergen Reactions  . Aricept [Donepezil Hcl]     Nightmares, Hallucinations  . Namenda [Memantine Hcl]     Nightmares, Hallucinations   Medications Prior to Admission  Medication Sig Dispense Refill  . aspirin EC 81 MG tablet Take 81 mg by mouth daily.    . B Complex-C (B-COMPLEX WITH VITAMIN C) tablet Take 1 tablet by mouth daily.    . cholecalciferol (VITAMIN D) 1000 units tablet Take 1,000 Units by mouth daily.    Marland Kitchen COLCRYS 0.6 MG tablet Take 0.6 mg by mouth daily as needed (gout flare). Reported on 10/04/2015    . finasteride (PROSCAR) 5 MG tablet Take 5 mg by mouth daily at 12 noon.    Marland Kitchen losartan (COZAAR) 100 MG tablet Take 100 mg by mouth daily.     . metFORMIN (GLUCOPHAGE-XR)  500 MG 24 hr tablet Take 500 mg by mouth 2 (two) times daily.     . tamsulosin (FLOMAX) 0.4 MG CAPS capsule Take 0.4 mg by mouth at bedtime.    . vitamin B-12 (CYANOCOBALAMIN) 1000 MCG tablet Take 1,000 mcg by mouth daily.    . Memantine HCl ER (NAMENDA XR) 7 MG CP24 Start 1 tab qdx7d, then 2tab qdx7d, then 3tab qdx7d. Call for refill (Patient not taking: Reported on 10/04/2015) 42 capsule 0     Home: Home Living Family/patient expects to be discharged to:: Private residence Living Arrangements: Spouse/significant other Available Help at Discharge: Family, Available 24 hours/day Type of Home: House Home Access: Stairs to enter [employer] of Steps: 2 Entrance Stairs-Rails: None Home Layout: One level Bathroom Shower/Tub: Engineer, manufacturing systems: Standard Bathroom Accessibility: Yes Home Equipment: Information systems manager, Medical laboratory scientific officer - single point, Environmental consultant - 2 wheels, Walker - 4 wheels  Functional History: Prior Function Level of Independence: Independent with assistive device(s) Comments: SPC use. Was driving lmited distances. Functional Status:  Mobility: Bed Mobility Overal bed mobility: Needs Assistance, +2 for physical assistance Bed Mobility: Rolling, Sidelying to Sit, Sit to Sidelying Rolling: Max assist Sidelying to sit: Max assist, +2 for physical assistance Sit to sidelying: Total assist, +2 for physical assistance General bed mobility comments: +2 required for all aspects of bed mobility. Some AROM noted in RLE but was not able to advance the LLE to EOB at all.  Transfers Overall transfer level: Needs assistance Equipment used: 2 person hand held assist Transfers: Sit to/from Stand Sit to Stand: Max assist, +2 physical assistance General transfer comment: BLE blocked. Pt required +2 to power-up to full standing position. Pt appeared to extend that L knee once in standing but may have hyperextended. Ambulation/Gait General Gait Details: Unable to assess at this time    ADL:    Cognition: Cognition Overall Cognitive Status: History of cognitive impairments - at baseline (Dementia at baseline) Orientation Level: Oriented X4 Cognition Arousal/Alertness: Lethargic Behavior During Therapy: WFL for tasks assessed/performed Overall Cognitive Status: History of cognitive impairments - at baseline (Dementia at baseline) Difficult to assess due to:  Impaired communication  Blood pressure 191/103, pulse 81, temperature 97.6 F (36.4 C), temperature source Oral, resp. rate 20, SpO2 100 %. Physical Exam  HENT:  Left facial droop  Eyes: EOM are normal.  Neck: Normal range of motion. Neck supple. No thyromegaly present.  Cardiovascular: Normal rate and regular rhythm.  Respiratory: Effort normal and breath sounds normal. No respiratory distress.  GI: Soft. Bowel sounds are normal. He exhibits no distension.  Neurological:  Patient is alert and pleasantly confused. Moderate dysarthria. Poor historian. He will follow some simple verbal commands.  Skin: Skin is warm and dry.  Motor strength is 0/5 in the left deltoid, biceps, triceps, grip, hip flexor, knee extensor, ankle dorsiflexor. Sensation intact to light touch in the left upper and left lower extremity. Right side has normal strength and sensation    Lab Results Last 24 Hours    Results for orders placed or performed during the hospital encounter of 10/04/15 (from the past 24 hour(s))  Glucose, capillary Status: Abnormal   Collection Time: 10/05/15 6:58 AM  Result Value Ref Range   Glucose-Capillary 145 (H) 65 - 99 mg/dL   Comment 1 Notify RN    Comment 2 Document in Chart   Vitamin B12 Status: None   Collection Time: 10/05/15 9:52 AM  Result Value Ref Range   Vitamin B-12 574 180 -  914 pg/mL  Glucose, capillary Status: Abnormal   Collection Time: 10/05/15 4:24 PM  Result Value Ref Range   Glucose-Capillary 172 (H) 65 - 99 mg/dL   Comment 1 Notify RN    Comment 2 Document in Chart   Glucose, capillary Status: Abnormal   Collection Time: 10/05/15 9:09 PM  Result Value Ref Range   Glucose-Capillary 214 (H) 65 - 99 mg/dL   Comment 1 Notify RN    Comment 2 Document in Chart   Glucose, capillary Status: Abnormal   Collection Time: 10/06/15 6:12 AM  Result Value Ref Range    Glucose-Capillary 212 (H) 65 - 99 mg/dL   Comment 1 Notify RN    Comment 2 Document in Chart       Imaging Results (Last 48 hours)    Ct Angio Head W/cm &/or Wo Cm  10/05/2015 CLINICAL DATA: Stroke. EXAM: CT ANGIOGRAPHY HEAD AND NECK TECHNIQUE: Multidetector CT imaging of the head and neck was performed using the standard protocol during bolus administration of intravenous contrast. Multiplanar CT image reconstructions and MIPs were obtained to evaluate the vascular anatomy. Carotid stenosis measurements (when applicable) are obtained utilizing NASCET criteria, using the distal internal carotid diameter as the denominator. CONTRAST: 21mL OMNIPAQUE IOHEXOL 350 MG/ML SOLN COMPARISON: Head CT from yesterday FINDINGS: CTA NECK Aortic arch: No aneurysm or dissection. 2 vessel branching. Extensive atherosclerotic plaque mixed area Right carotid system: Diffuse atheromatous wall thickening. No flow limiting (50% or greater) stenosis. No dissection. Left carotid system:Diffuse atheromatous wall thickening. No flow limiting (50% or greater) stenosis. Vertebral arteries: No dissection. No proximal subclavian flow limiting stenosis. Left vertebral artery dominance. No evidence of dissection. High-grade right ostial stenosis. Skeleton: No contributory findings Other neck: Patchy ground-glass and nodular opacities in the right upper lobe with a infectious/ inflammatory appearance CTA HEAD Anterior circulation: Symmetric carotid arteries. Hypoplastic right A1 segment with anterior communicating artery. Bilateral posterior communicating arteries. No proximal occlusion or flow limiting stenosis. Atherosclerotic calcification on the carotid siphons. High-grade distal left M2 branch stenosis best seen on coronal reformats image 106. Elsewhere atherosclerotic irregularity and narrowing is mild to moderate. Posterior circulation: Left dominant. Early branching right PICA. The right V4 segment diffuse  atherosclerotic type irregularity, with minimal if any contribution to the basilar. These narrowings are beyond the PICA origin. Atherosclerotic irregularity of the basilar which is narrow in the setting of sizable posterior communicating arteries. No major branch occlusion. Congenitally small left P1 segment with superimposed atherosclerotic irregularity. Best seen on coronal reformats there is moderate stenosis of right P4 branch. Negative for aneurysm. Venous sinuses: Patent Anatomic variants: Intact circle-of-Willis Delayed phase: Not performed IMPRESSION: 1. No acute arterial finding. 2. Cervical and vertebral artery atherosclerosis. High-grade non dominant right vertebral ostium and V4 stenoses. 3. Intracranial atherosclerosis as described. 4. Patchy opacity in the right lung with inflammatory appearance. Correlate for symptoms of active pneumonitis/pneumonia. Electronically Signed By: Marnee Spring M.D. On: 10/05/2015 11:31   Dg Chest 2 View  10/04/2015 CLINICAL DATA: Stroke symptoms since 1100 hrs today, hx diabetic, hx hypertension EXAM: CHEST 2 VIEW COMPARISON: 08/12/2015 FINDINGS: Cardiac silhouette normal in size and configuration. No mediastinal or hilar masses or evidence of adenopathy. Previously seen airspace opacities have resolved. Lungs are mildly hyperexpanded. There are mildly prominent bronchovascular markings which are stable. No evidence of pneumonia or edema. No pleural effusion or pneumothorax. Bony thorax is demineralized but grossly intact. IMPRESSION: No acute cardiopulmonary disease. Electronically Signed By: Amie Portland M.D. On: 10/04/2015 17:58  Ct Head Wo Contrast  10/04/2015 ADDENDUM REPORT: 10/04/2015 15:18 ADDENDUM: Return call at 3:18 p.m. with direct communication with Dr. Lavon Paganini. Electronically Signed By: Jeronimo Greaves M.D. On: 10/04/2015 15:18  10/04/2015 CLINICAL DATA: Slurred speech. Left-sided facial droop. Hypertension and diabetes. EXAM: CT  HEAD WITHOUT CONTRAST TECHNIQUE: Contiguous axial images were obtained from the base of the skull through the vertex without intravenous contrast. COMPARISON: MRI of 03/12/2014. FINDINGS: Sinuses/Soft tissues: Surgical changes about both globes. Minimal mucosal thickening of ethmoid air cells. Hypoplastic frontal sinuses. Clear mastoid air cells. Intracranial: Expected cerebral volume loss for age. Mild for age low density in the periventricular white matter likely related to small vessel disease. Bilateral vertebral and carotid intracranial atherosclerosis. No mass lesion, hemorrhage, hydrocephalus, acute infarct, intra-axial, or extra-axial fluid collection. IMPRESSION: No acute intracranial abnormality. The referring physician was paged at 3 p.m. and a return call is pending. Electronically Signed: By: Jeronimo Greaves M.D. On: 10/04/2015 15:01   Ct Angio Neck W/cm &/or Wo/cm  10/05/2015 CLINICAL DATA: Stroke. EXAM: CT ANGIOGRAPHY HEAD AND NECK TECHNIQUE: Multidetector CT imaging of the head and neck was performed using the standard protocol during bolus administration of intravenous contrast. Multiplanar CT image reconstructions and MIPs were obtained to evaluate the vascular anatomy. Carotid stenosis measurements (when applicable) are obtained utilizing NASCET criteria, using the distal internal carotid diameter as the denominator. CONTRAST: 50mL OMNIPAQUE IOHEXOL 350 MG/ML SOLN COMPARISON: Head CT from yesterday FINDINGS: CTA NECK Aortic arch: No aneurysm or dissection. 2 vessel branching. Extensive atherosclerotic plaque mixed area Right carotid system: Diffuse atheromatous wall thickening. No flow limiting (50% or greater) stenosis. No dissection. Left carotid system:Diffuse atheromatous wall thickening. No flow limiting (50% or greater) stenosis. Vertebral arteries: No dissection. No proximal subclavian flow limiting stenosis. Left vertebral artery dominance. No evidence of dissection. High-grade right  ostial stenosis. Skeleton: No contributory findings Other neck: Patchy ground-glass and nodular opacities in the right upper lobe with a infectious/ inflammatory appearance CTA HEAD Anterior circulation: Symmetric carotid arteries. Hypoplastic right A1 segment with anterior communicating artery. Bilateral posterior communicating arteries. No proximal occlusion or flow limiting stenosis. Atherosclerotic calcification on the carotid siphons. High-grade distal left M2 branch stenosis best seen on coronal reformats image 106. Elsewhere atherosclerotic irregularity and narrowing is mild to moderate. Posterior circulation: Left dominant. Early branching right PICA. The right V4 segment diffuse atherosclerotic type irregularity, with minimal if any contribution to the basilar. These narrowings are beyond the PICA origin. Atherosclerotic irregularity of the basilar which is narrow in the setting of sizable posterior communicating arteries. No major branch occlusion. Congenitally small left P1 segment with superimposed atherosclerotic irregularity. Best seen on coronal reformats there is moderate stenosis of right P4 branch. Negative for aneurysm. Venous sinuses: Patent Anatomic variants: Intact circle-of-Willis Delayed phase: Not performed IMPRESSION: 1. No acute arterial finding. 2. Cervical and vertebral artery atherosclerosis. High-grade non dominant right vertebral ostium and V4 stenoses. 3. Intracranial atherosclerosis as described. 4. Patchy opacity in the right lung with inflammatory appearance. Correlate for symptoms of active pneumonitis/pneumonia. Electronically Signed By: Marnee Spring M.D. On: 10/05/2015 11:31   Mr Brain Wo Contrast  10/05/2015 CLINICAL DATA: 82 year old hypertensive diabetic male with facial asymmetry, slurred speech and left-sided weakness. Subsequent encounter. EXAM: MRI HEAD WITHOUT CONTRAST TECHNIQUE: Multiplanar, multiecho pulse sequences of the brain and surrounding structures  were obtained without intravenous contrast. COMPARISON: CT angiogram 10/05/2015. Head CT 10/04/2015. Brain MR 03/12/2014. FINDINGS: Exam is motion degraded. Moderate-size acute nonhemorrhagic infarct extends from the posterior right  lenticular nucleus extending to posterior right corona radiata. Tiny area blood breakdown products posterior left frontal lobe most consistent with result prior hemorrhagic ischemia. Remote small cerebellar infarcts bilaterally, right paracentral pontine infarct, basal ganglia infarcts, left thalamic infarct and small infarcts centrum semi ovale greater on the right. Moderate chronic microvascular changes. Global moderate atrophy without hydrocephalus. No intracranial mass lesion noted on this unenhanced exam. Abnormal appearance right vertebral artery. Please see CT angiogram report. Post lens replacement. Cervical medullary junction and pineal region unremarkable. IMPRESSION: Moderate-size acute nonhemorrhagic infarct extends from the posterior right lenticular nucleus extending to posterior right corona radiata. Remote small cerebellar infarcts bilaterally, right paracentral pontine infarct, basal ganglia infarcts, left thalamic infarct and small infarcts centrum semi ovale greater on the right. Moderate chronic microvascular changes. Global moderate atrophy without hydrocephalus. Abnormal appearance right vertebral artery. Please see CT angiogram report. Electronically Signed By: Lacy Duverney M.D. On: 10/05/2015 13:33   Dg Swallowing Func-speech Pathology  10/05/2015 Objective Swallowing Evaluation: Type of Study: MBS-Modified Barium Swallow Study Patient Details Name: Lavalle Skoda MRN: [id number] Date of Birth: 05/15/1931 Today's Date: 10/05/2015 Time: SLP Start Time (ACUTE ONLY): 1056-SLP Stop Time (ACUTE ONLY): 1119 SLP Time Calculation (min) (ACUTE ONLY): 23 min Past Medical History: Past Medical History Diagnosis Date . Diabetes (HCC) . Hypertension . Gout . ED  (erectile dysfunction) . Heart murmur . Kidney stones . Dysphagia Past Surgical History: Past Surgical History Procedure Laterality Date . Tonsillectomy . Appendectomy . Lithotripsy . Uvulopalatopharyngoplasty for snoring HPI: Pt is an 82 y/o male who presents with slurred speech, L facial droop and L sided weakness. CT was negative for acute changes, and MRI is currently pending.  Subjective: patient upright in bed with daughter at bedside Assessment / Plan / Recommendation CHL IP CLINICAL IMPRESSIONS 10/05/2015 Therapy Diagnosis Severe oral phase dysphagia;Moderate pharyngeal phase dysphagia;Other (Comment) Clinical Impression Patient presents with baseline mild dysphagia, which is now compounded by new onset of left sided weakness. This results in primarily motor based deficits. Mastication of soft solids is prolonged and results in severe vallecular residue. When textures are modified to Dys.1 textures patient able to reduce residue with 3 swallows. Nectar-thick liquids allowed extra time for sluggish swallow response and modification of use of teaspoon ensured small portions which resulted in penetration that either flashed or was sensed and effectively cleared pharynx with throat clears. Recommend initiation of Dys.1 textures and nectar-thick liquids with full supervision and strict use of modifications and safe swallow strategies to maximize swallow safety. Education completed with teach back from patient, daughter and wife. SLP will follow acutely and daughter requests inpatient rehab for follow up therapies; SLP in agreement. Impact on safety and function Mild aspiration risk CHL IP TREATMENT RECOMMENDATION 10/05/2015 Treatment Recommendations Therapy as outlined in treatment plan below Prognosis 10/05/2015 Prognosis for Safe Diet Advancement Good Barriers to Reach Goals Cognitive deficits;Time post onset;Severity of deficits Barriers/Prognosis Comment -- CHL IP DIET  RECOMMENDATION 10/05/2015 SLP Diet Recommendations Dysphagia 1 (Puree) solids;Nectar thick liquid Liquid Administration via Spoon Medication Administration Crushed with puree Compensations Minimize environmental distractions;Slow rate;Small sips/bites;Multiple dry swallows after each bite/sip;Follow solids with liquid;Clear throat intermittently Postural Changes Seated upright at 90 degrees;Remain semi-upright after after feeds/meals (Comment) CHL IP OTHER RECOMMENDATIONS 10/05/2015 Recommended Consults -- Oral Care Recommendations Oral care BID Other Recommendations Order thickener from pharmacy;Prohibited food (jello, ice cream, thin soups);Remove water pitcher;Have oral suction available CHL IP FOLLOW UP RECOMMENDATIONS 10/05/2015 Follow up Recommendations 24 hour supervision/assistance;Inpatient Rehab CHL IP FREQUENCY AND DURATION 10/05/2015  Speech Therapy Frequency (ACUTE ONLY) min 2x/week Treatment Duration 2 weeks CHL IP ORAL PHASE 10/05/2015 Oral Phase Impaired Oral - Pudding Teaspoon -- Oral - Pudding Cup -- Oral - Honey Teaspoon -- Oral - Honey Cup -- Oral - Nectar Teaspoon Weak lingual manipulation;Incomplete tongue to palate contact;Reduced posterior propulsion;Holding of bolus;Lingual/palatal residue;Piecemeal swallowing;Delayed oral transit;Decreased bolus cohesion;Premature spillage Oral - Nectar Cup Left anterior bolus loss;Weak lingual manipulation;Lingual/palatal residue;Piecemeal swallowing;Delayed oral transit;Decreased bolus cohesion;Premature spillage Oral - Nectar Straw -- Oral - Thin Teaspoon -- Oral - Thin Cup Left anterior bolus loss;Weak lingual manipulation;Incomplete tongue to palate contact;Reduced posterior propulsion;Holding of bolus;Lingual/palatal residue;Piecemeal swallowing;Delayed oral transit;Decreased bolus cohesion;Premature spillage Oral - Thin Straw -- Oral - Puree Weak lingual manipulation;Incomplete tongue to palate contact;Reduced posterior propulsion;Holding of  bolus;Lingual/palatal residue;Piecemeal swallowing;Delayed oral transit;Premature spillage Oral - Mech Soft Impaired mastication;Weak lingual manipulation;Incomplete tongue to palate contact;Reduced posterior propulsion;Holding of bolus;Left pocketing in lateral sulci;Lingual/palatal residue;Piecemeal swallowing;Delayed oral transit;Premature spillage Oral - Regular -- Oral - Multi-Consistency -- Oral - Pill -- Oral Phase - Comment -- CHL IP PHARYNGEAL PHASE 10/05/2015 Pharyngeal Phase Impaired Pharyngeal- Pudding Teaspoon -- Pharyngeal -- Pharyngeal- Pudding Cup -- Pharyngeal -- Pharyngeal- Honey Teaspoon -- Pharyngeal -- Pharyngeal- Honey Cup -- Pharyngeal -- Pharyngeal- Nectar Teaspoon Delayed swallow initiation-vallecula;Reduced epiglottic inversion;Reduced tongue base retraction;Reduced laryngeal elevation;Reduced airway/laryngeal closure;Penetration/Aspiration during swallow;Pharyngeal residue - valleculae;Pharyngeal residue - pyriform Pharyngeal Material enters airway, remains ABOVE vocal cords then ejected out;Material enters airway, remains ABOVE vocal cords and not ejected out;Other (Comment) Pharyngeal- Nectar Cup Delayed swallow initiation-pyriform sinuses;Reduced epiglottic inversion;Reduced laryngeal elevation;Reduced airway/laryngeal closure;Reduced tongue base retraction;Penetration/Aspiration before swallow;Penetration/Aspiration during swallow;Penetration/Apiration after swallow;Moderate aspiration;Pharyngeal residue - valleculae;Pharyngeal residue - pyriform Pharyngeal Material enters airway, CONTACTS cords and not ejected out;Other (Comment);Material enters airway, passes BELOW cords and not ejected out despite cough attempt by patient Pharyngeal- Nectar Straw -- Pharyngeal -- Pharyngeal- Thin Teaspoon -- Pharyngeal -- Pharyngeal- Thin Cup Delayed swallow initiation-pyriform sinuses;Reduced epiglottic inversion;Reduced laryngeal elevation;Reduced airway/laryngeal closure;Reduced tongue base  retraction;Penetration/Aspiration before swallow;Penetration/Aspiration during swallow;Penetration/Apiration after swallow;Moderate aspiration;Pharyngeal residue - valleculae;Pharyngeal residue - pyriform Pharyngeal Material enters airway, passes BELOW cords and not ejected out despite cough attempt by patient Pharyngeal- Thin Straw -- Pharyngeal -- Pharyngeal- Puree Delayed swallow initiation-vallecula;Reduced epiglottic inversion;Reduced laryngeal elevation;Reduced tongue base retraction;Pharyngeal residue - valleculae;Pharyngeal residue - pyriform;Other (Comment) Pharyngeal -- Pharyngeal- Mechanical Soft Delayed swallow initiation-vallecula;Reduced laryngeal elevation;Reduced epiglottic inversion;Reduced tongue base retraction;Pharyngeal residue - valleculae;Pharyngeal residue - pyriform;Other (Comment) Pharyngeal -- Pharyngeal- Regular -- Pharyngeal -- Pharyngeal- Multi-consistency -- Pharyngeal -- Pharyngeal- Pill -- Pharyngeal -- Pharyngeal Comment -- CHL IP CERVICAL ESOPHAGEAL PHASE 10/05/2015 Cervical Esophageal Phase WFL Pudding Teaspoon -- Pudding Cup -- Honey Teaspoon -- Honey Cup -- Nectar Teaspoon -- Nectar Cup -- Nectar Straw -- Thin Teaspoon -- Thin Cup -- Thin Straw -- Puree -- Mechanical Soft -- Regular -- Multi-consistency -- Pill -- Cervical Esophageal Comment -- CHL IP GO 03/12/2014 Functional Assessment Tool Used clinical judgement Functional Limitations Swallowing Swallow Current Status (V5643) CK Swallow Goal Status (P2951) CK Swallow Discharge Status (O8416) CK Motor Speech Current Status (S0630) (None) Motor Speech Goal Status (Z6010) (None) Motor Speech Goal Status (X3235) (None) Spoken Language Comprehension Current Status (T7322) (None) Spoken Language Comprehension Goal Status (G2542) (None) Spoken Language Comprehension Discharge Status (H0623) (None) Spoken Language Expression Current Status (J6283) (None) Spoken Language Expression Goal Status (T5176) (None) Spoken Language Expression  Discharge Status (H6073) (None) Attention Current Status (X1062) (None) Attention Goal Status (I9485) (None) Attention Discharge Status (I6270) (None) Memory Current Status (J5009) (None)  Memory Goal Status [phone]) (None) Memory Discharge Status [phone]) (None) Voice Current Status [phone]) (None) Voice Goal Status (B1478) (None) Voice Discharge Status [phone]) (None) Other Speech-Language Pathology Functional Limitation [phone]) (None) Other Speech-Language Pathology Functional Limitation Goal Status [phone]) (None) Other Speech-Language Pathology Functional Limitation Discharge Status [phone]) (None) Charlane Ferretti., CCC-SLP [phone] BOWIE,MELISSA 10/05/2015, 2:49 PM     Assessment/Plan: Diagnosis: 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 Does the need for close, 24 hr/day medical supervision in concert with the patient's rehab needs make it unreasonable for this patient to be served in a less intensive setting? Yes 2. Co-Morbidities requiring supervision/potential complications: Multi-infarct dementia, hypertension, diabetes 3. Due to bladder management, bowel management, safety, skin/wound care, disease management, medication administration and pain management, does the patient require 24 hr/day rehab nursing? Yes 4. Does the patient require coordinated care of a physician, rehab nurse, PT (1-2 hrs/day, 5 days/week), OT (11-2 hrs/day, 5 days/week) and SLP (0.5-1 hrs/day, 5 days/week) to address physical and functional deficits in the context of the above medical diagnosis(es)? Yes Addressing deficits in the following areas: balance, endurance, locomotion, strength, transferring, bowel/bladder control, bathing, dressing, feeding, grooming, toileting, cognition, speech, language, swallowing and psychosocial support 5. Can the patient actively participate in an intensive therapy program of at least 3 hrs of therapy per day at  least 5 days per week? Yes 6. The potential for patient to make measurable gains while on inpatient rehab is good 7. Anticipated functional outcomes upon discharge from inpatient rehab are supervision with PT, supervision with OT, supervision with SLP. 8. Estimated rehab length of stay to reach the above functional goals is: 20-23 days 9. Does the patient have adequate social supports and living environment to accommodate these discharge functional goals? Potentially 10. Anticipated D/C setting: Home 11. Anticipated post D/C treatments: HH therapy 12. Overall Rehab/Functional Prognosis: good  RECOMMENDATIONS: This patient's condition is appropriate for continued rehabilitative care in the following setting: CIR Patient has agreed to participate in recommended program. Potentially Note that insurance prior authorization may be required for reimbursement for recommended care.  Comment: Daughter is able to provide intermittent supervision, wife can provide supervision but no physical assistance    10/06/2015       Revision History     Date/Time User Provider Type Action   10/06/2015 10:27 AM Erick Colace, MD Physician Sign   10/06/2015 6:45 AM Charlton Amor, PA-C Physician Assistant Pend   View Details Report       Routing History     Date/Time From To Method   10/06/2015 10:27 AM Erick Colace, MD Erick Colace, MD In [facility]

## 2015-10-07 NOTE — Progress Notes (Signed)
Patient is discharged from room 5M18 at this time. Alert and in stable condition. IV site d/c'd as well as tele. Report given to receiving nurse Angie, RN at unit 4MW01. Transported via bed with family and all belongings at side.

## 2015-10-07 NOTE — Care Management Note (Signed)
Case Management Note  Patient Details  Name: Roger [name] MRN: [id number] Date of Birth: 23-Apr-1931  Subjective/Objective:                    Action/Plan: Patient being discharged to CIR today. No further needs per CM.   Expected Discharge Date:   (Pending)               Expected Discharge Plan:     In-House Referral:     Discharge planning Services     Post Acute Care Choice:    Choice offered to:     DME Arranged:    DME Agency:     HH Arranged:    HH Agency:     Status of Service:  In process, will continue to follow  Medicare Important Message Given:    Date Medicare IM Given:    Medicare IM give by:    Date Additional Medicare IM Given:    Additional Medicare Important Message give by:     If discussed at Long Length of Stay Meetings, dates discussed:    Additional Comments:  Kermit Balo, RN 10/07/2015, 11:06 AM

## 2015-10-07 NOTE — Care Management Important Message (Signed)
Important Message  Patient Details  Name: Zubair Balz MRN: [id number] Date of Birth: 11-Oct-1930   Medicare Important Message Given:  Yes    Arty Lantzy P Quintavius Niebuhr 10/07/2015, 2:15 PM

## 2015-10-07 NOTE — Progress Notes (Signed)
[name], RN Rehab Admission Coordinator Signed Physical Medicine and Rehabilitation PMR Pre-admission 10/06/2015 2:52 PM  Related encounter: ED to Hosp-Admission (Current) from 10/04/2015 in [place] Collapse All   PMR Admission Coordinator Pre-Admission Assessment  Patient: Roger [name] is an 82 y.o., male MRN: [id number] DOB: [birth year] Height: 5' 9"  (175.3 cm) Weight: 141 lbs Insurance Information HMO: PPO: No PCP: IPA: 80/20: OTHER: Group # [id number] PRIMARY: [employer] Medicare Policy#: [id number] Subscriber: Tanja Port CM Name: Earney Hamburg Phone#: [phone] Fax#: Epic access Pre-Cert#: [id number] Employer: Retired Benefits: Phone #: [phone] Name: Hanley Seamen. Date: 08/08/15 Deduct: $0 Out of Pocket Max: $4000 (met $174.53) Life Max: unlimited CIR: $160 days 1-10 SNF: $0 days 1-20; $50 days 21-100 Outpatient: Medical necessity Co-Pay: $20 Home Health: 100% Co-Pay: none DME: 80% Co-Pay: 20% Providers: in network  Emergency Contact Information Contact Information    Name Relation Home Work Mobile   Makris,Christine Spouse [phone]       Current Medical History  Patient Admitting Diagnosis:Right lenticular nucleus and corona radiata infarcts   History of Present Illness: An 82 y.o. right handed male with history of hypertension, diabetes mellitus and dementia. Patient lives with spouse independent with assistive device prior to admission. One level home with 2 steps to entry. He was driving short distances prior to admission. Presented 10/04/2015 with left-sided weakness. MRI of the brain shows moderate size acute nonhemorrhagic infarct extending from the posterior right  lenticular nuclear was extending into posterior right corona radiata. Remote small cerebellar infarcts bilaterally, right paracentral pontine infarct, basal ganglia infarct, left thalamic infarct and small infarcts Centrum semi-ovale greater on the right. CTA of the head with no acute arterial finding. Echocardiogram showed grade 1 diastolic dysfunction, no wall motion abnormalities. Systolic function was normal. Patient did not receive TPA. Neurology consulted presently on Plavix for CVA prophylaxis. Subcutaneous heparin for DVT prophylaxis. Dysphagia #1 nectar thick liquid diet. Physical and occupational therapy evaluation completed 10/05/2015 with recommendations of physical medicine rehabilitation consult. Patient to be admitted for comprehensive inpatient rehabilitation program.  Total: 13=NIH  Past Medical History  Past Medical History  Diagnosis Date  . Diabetes ([place])   . Hypertension   . Gout   . ED (erectile dysfunction)   . Heart murmur   . Kidney stones   . Dysphagia     Family History  family history is negative for Ataxia, Chorea, Dementia, Mental retardation, Migraines, Multiple sclerosis, Neurofibromatosis, Neuropathy, Parkinsonism, Seizures, and Stroke.  Prior Rehab/Hospitalizations: No previous rehab admissions  Has the patient had major surgery during 100 days prior to admission? No  Current Medications   Current facility-administered medications:  . atorvastatin (LIPITOR) tablet 20 mg, 20 mg, Oral, q1800, Rosalin Hawking, MD, 20 mg at 10/06/15 1752 . clopidogrel (PLAVIX) tablet 75 mg, 75 mg, Oral, Daily, Ram Fuller Mandril, MD, 75 mg at 10/07/15 0945 . colchicine tablet 0.6 mg, 0.6 mg, Oral, Daily, Rondel Jumbo, PA-C, 0.6 mg at 10/07/15 0945 . docusate sodium (COLACE) capsule 200 mg, 200 mg, Oral, QHS, Gardiner Barefoot, NP, 200 mg at 10/06/15 2106 . heparin injection 5,000 Units, 5,000 Units, Subcutaneous, 3 times per day, Rondel Jumbo, PA-C, 5,000 Units at 10/07/15 0620 . hydrALAZINE (APRESOLINE) injection 5-10 mg, 5-10 mg, Intravenous, Q6H PRN, Rondel Jumbo, PA-C . insulin aspart (novoLOG) injection 0-9 Units, 0-9 Units, Subcutaneous, TID WC, Rondel Jumbo, PA-C, 3 Units at 10/07/15 [phone] . labetalol (NORMODYNE,TRANDATE) injection  5-10 mg, 5-10 mg, Intravenous, Q6H PRN, Rondel Jumbo, PA-C . polyethylene glycol (MIRALAX / GLYCOLAX) packet 17 g, 17 g, Oral, Daily PRN, Gardiner Barefoot, NP  Patients Current Diet: DIET - DYS 1 Room service appropriate?: Yes; Fluid consistency:: Nectar Thick Diet - low sodium heart healthy  Precautions / Restrictions Precautions Precautions: Fall Restrictions Weight Bearing Restrictions: No   Has the patient had 2 or more falls or a fall with injury in the past year?No. Patient reports 1 fall where he stumbled, no injury.  Prior Activity Level Community (5-7x/wk): Went out daily. Has a garden.  Home Assistive Devices / Equipment Home Assistive Devices/Equipment: None Home Equipment: Shower seat, Cane - single point, Environmental consultant - 2 wheels, Walker - 4 wheels  Prior Device Use: Indicate devices/aids used by the patient prior to current illness, exacerbation or injury? Cane  Prior Functional Level Prior Function Level of Independence: Independent with assistive device(s) Comments: SPC use. Was driving lmited distances.  Self Care: Did the patient need help bathing, dressing, using the toilet or eating? Independent  Indoor Mobility: Did the patient need assistance with walking from room to room (with or without device)? Independent  Stairs: Did the patient need assistance with internal or external stairs (with or without device)? Independent  Functional Cognition: Did the patient need help planning regular tasks such as shopping or remembering to take medications? Independent  Current Functional Level Cognition  Overall Cognitive Status: History of cognitive  impairments - at baseline Difficult to assess due to: Impaired communication Orientation Level: Oriented to person, Oriented to situation, Disoriented to time, Oriented to place   Extremity Assessment (includes Sensation/Coordination)  Upper Extremity Assessment: LUE deficits/detail LUE Deficits / Details: PROM WFL. Increased tone with quick stretch. No AROM noted. Strength 0/5 overall. No grip strength. LUE Coordination: decreased fine motor, decreased gross motor  Lower Extremity Assessment: Defer to PT evaluation LLE Deficits / Details: Noted weakness in the LLE especially in quads. Knee buckling in standing and required blocking for safe stand. No active movement noted.     ADLs  Overall ADL's : Needs assistance/impaired Eating/Feeding: Minimal assistance, Sitting Grooming: Minimal assistance, Sitting Upper Body Bathing: Moderate assistance, Sitting Lower Body Bathing: Maximal assistance, +2 for physical assistance, Sit to/from stand Upper Body Dressing : Moderate assistance, Sitting Lower Body Dressing: Maximal assistance, +2 for physical assistance, Sit to/from stand Toilet Transfer: Maximal assistance, +2 for physical assistance, Cueing for sequencing, Stand-pivot, BSC (2 person hand held assist) Toilet Transfer Details (indicate cue type and reason): Simulated by transfer from EOB to chair. Functional mobility during ADLs: Maximal assistance, +2 for physical assistance, Cueing for sequencing (for stand pivot transfer) General ADL Comments: Pts daughter present for OT eval. Educated on LUE positioning techniques, LUE ROM techniques, incorporating LUE into functional activities.    Mobility  Overal bed mobility: Needs Assistance, +2 for physical assistance Bed Mobility: Supine to Sit Rolling: Max assist Sidelying to sit: Max assist, +2 for physical assistance Supine to sit: Mod assist, +2 for physical assistance Sit to sidelying: Total assist, +2 for physical  assistance General bed mobility comments: Pt was able to advance LE's towards the EOB. Slight assist required for LLE movement. Pt held to the L railing with RUE for support, and +2 assist was provided for trunk elevation to full sitting position. Bed pad used to complete scooting out to EOB.     Transfers  Overall transfer level: Needs assistance Equipment used: 2 person hand held assist Transfers: Sit to/from  Stand Sit to Stand: Max assist, +2 physical assistance General transfer comment: LLE blocked. Pt required +2 to power-up to full standing position. With max support provided, pt was able to advance RLE towards chair with assist to slide LLE along.     Ambulation / Gait / Stairs / Wheelchair Mobility  Ambulation/Gait General Gait Details: Unable at this time,    Posture / Balance Dynamic Sitting Balance Sitting balance - Comments: Required assist to maintain upright posture even with RUE support on bed rails.  Balance Overall balance assessment: Needs assistance Sitting-balance support: Feet supported, Single extremity supported Sitting balance-Leahy Scale: Poor Sitting balance - Comments: Required assist to maintain upright posture even with RUE support on bed rails.  Postural control: Posterior lean Standing balance support: Single extremity supported, During functional activity Standing balance-Leahy Scale: Zero Standing balance comment: +2 assist required for static standing balance.    Special needs/care consideration BiPAP/CPAP Yes, has CPAP CPM No Continuous Drip IV No Dialysis No  Life Vest No Oxygen No Special Bed No Trach Size No Wound Vac (area) No  Skin No  Bowel mgmt: No documented BM since admission Bladder mgmt: Condom catheter, incontinence Diabetic mgmt Yes, managed with diet at home, no medications at home    Previous Home Environment Living Arrangements: Spouse/significant other Available Help at  Discharge: Family, Available 24 hours/day Type of Home: House Home Layout: One level Home Access: Stairs to enter Entrance Stairs-Rails: None Entrance Stairs-Number of Steps: 2 Bathroom Shower/Tub: Optometrist: Yes Home Care Services: No  Discharge Living Setting Plans for Discharge Living Setting: Patient's home, House, Lives with (comment) (Lives with wife and has a cat named Spunky) Type of Home at Discharge: House Discharge Home Layout: One level Discharge Home Access: Stairs to enter Entrance Stairs-Number of Steps: 2 steps at the back with no rail Does the patient have any problems obtaining your medications?: No  Social/Family/Support Systems Patient Roles: Spouse, Parent, Other (Comment) (Has a wife, daughter and a step daughter.) Contact Information: Humberto Addo - wife [phone] Anticipated Caregiver: wife and daughter and may hire caregivers Anticipated Caregiver's Contact Information: Smitty Pluck - daughter - [phone] Ability/Limitations of Caregiver: Wife and daughter can provide supervision, but not physical assist. They may hire a Psychologist, counselling for prn care Caregiver Availability: 24/7 Discharge Plan Discussed with Primary Caregiver: Yes Is Caregiver In Agreement with Plan?: Yes Does Caregiver/Family have Issues with Lodging/Transportation while Pt is in Rehab?: No  Goals/Additional Needs Patient/Family Goal for Rehab: PT/OT/ST supervision goals Expected length of stay: 20-23 days Cultural Considerations: Mina Marble, goes to church on Sundays Dietary Needs: Dys 1, nectar thick liquids Equipment Needs: TBD Pt/Family Agrees to Admission and willing to participate: Yes Program Orientation Provided & Reviewed with Pt/Caregiver Including Roles & Responsibilities: Yes  Decrease burden of Care through IP rehab admission: N/A  Possible need for SNF placement upon discharge: Not anticipated  Patient  Condition: This patient's condition remains as documented in the consult dated 10/06/15, in which the Rehabilitation Physician determined and documented that the patient's condition is appropriate for intensive rehabilitative care in an inpatient rehabilitation facility. Will admit to inpatient rehab today.  Preadmission Screen Completed By: [name], 10/07/2015 11:16 AM ______________________________________________________________________  Discussed status with Dr. Letta Pate on 10/07/15 at 16 and received telephone approval for admission today.  Admission Coordinator: [name], time1116/Date03/02/17          Cosigned by: Charlett Blake, MD at 10/07/2015 11:19 AM  Revision  History     Date/Time User Provider Type Action   10/07/2015 11:19 AM Charlett Blake, MD Physician Cosign   10/07/2015 11:16 AM [name], RN Rehab Admission Coordinator Sign

## 2015-10-07 NOTE — H&P (Signed)
Physical Medicine and Rehabilitation Admission H&P   Chief Complaint  Patient presents with  . Code Stroke  : HPI: Roger [name] is a 82 y.o. right handed male with history of hypertension, diabetes mellitus and dementia. Patient lives with spouse independent with assistive device prior to admission. One level home with 2 steps to entry. He was driving short distances prior to admission. Presented 10/04/2015 with left-sided weakness. MRI of the brain shows moderate size acute nonhemorrhagic infarct extending from the posterior right lenticular nuclear was extending into posterior right corona radiata. Remote small cerebellar infarcts bilaterally, right paracentral pontine infarct, basal ganglia infarct, left thalamic infarct and small infarcts Centrum semi-ovale greater on the right. CTA of the head with no acute arterial finding. Echocardiogram showed grade 1 diastolic dysfunction, no wall motion abnormalities. Systolic function was normal. Patient did not receive TPA. Neurology consulted presently on Plavix for CVA prophylaxis. Subcutaneous heparin for DVT prophylaxis. Dysphagia #1 nectar thick liquid diet. Physical and occupational therapy evaluation completed 10/05/2015 with recommendations of physical medicine rehabilitation consult. Patient was admitted for comprehensive rehabilitation program  ROS Review of Systems  Unable to perform ROS: mental acuity    Past Medical History  Diagnosis Date  . Diabetes ([place])   . Hypertension   . Gout   . ED (erectile dysfunction)   . Heart murmur   . Kidney stones   . Dysphagia    Past Surgical History  Procedure Laterality Date  . Tonsillectomy    . Appendectomy    . Lithotripsy    . Uvulopalatopharyngoplasty      for snoring   Family History  Problem Relation Age of Onset  . Ataxia Neg Hx   . Chorea Neg Hx   . Dementia Neg Hx   . Mental retardation Neg Hx   .  Migraines Neg Hx   . Multiple sclerosis Neg Hx   . Neurofibromatosis Neg Hx   . Neuropathy Neg Hx   . Parkinsonism Neg Hx   . Seizures Neg Hx   . Stroke Neg Hx    Social History:  reports that he has never smoked. He does not have any smokeless tobacco history on file. He reports that he does not drink alcohol or use illicit drugs. Allergies:  Allergies  Allergen Reactions  . Aricept [Donepezil Hcl]     Nightmares, Hallucinations  . Namenda [Memantine Hcl]     Nightmares, Hallucinations   Medications Prior to Admission  Medication Sig Dispense Refill  . aspirin EC 81 MG tablet Take 81 mg by mouth daily.    . B Complex-C (B-COMPLEX WITH VITAMIN C) tablet Take 1 tablet by mouth daily.    . cholecalciferol (VITAMIN D) 1000 units tablet Take 1,000 Units by mouth daily.    Marland Kitchen COLCRYS 0.6 MG tablet Take 0.6 mg by mouth daily as needed (gout flare). Reported on 10/04/2015    . finasteride (PROSCAR) 5 MG tablet Take 5 mg by mouth daily at 12 noon.    Marland Kitchen losartan (COZAAR) 100 MG tablet Take 100 mg by mouth daily.     . metFORMIN (GLUCOPHAGE-XR) 500 MG 24 hr tablet Take 500 mg by mouth 2 (two) times daily.     . tamsulosin (FLOMAX) 0.4 MG CAPS capsule Take 0.4 mg by mouth at bedtime.    . vitamin B-12 (CYANOCOBALAMIN) 1000 MCG tablet Take 1,000 mcg by mouth daily.    . Memantine HCl ER (NAMENDA XR) 7 MG CP24 Start 1 tab qdx7d, then 2tab qdx7d, then  3tab qdx7d. Call for refill (Patient not taking: Reported on 10/04/2015) 42 capsule 0    Home: Home Living Family/patient expects to be discharged to:: Private residence Living Arrangements: Spouse/significant other Available Help at Discharge: Family, Available 24 hours/day Type of Home: House Home Access: Stairs to enter [employer] of Steps: 2 Entrance Stairs-Rails: None Home Layout: One level Bathroom Shower/Tub: Scientist, forensic: Standard Bathroom Accessibility: Yes Home Equipment: [place] - single point, Environmental consultant - 2 wheels, Walker - 4 wheels  Functional History: Prior Function Level of Independence: Independent with assistive device(s) Comments: SPC use. Was driving lmited distances.  Functional Status:  Mobility: Bed Mobility Overal bed mobility: Needs Assistance, +2 for physical assistance Bed Mobility: Supine to Sit Rolling: Max assist Sidelying to sit: Max assist, +2 for physical assistance Supine to sit: Mod assist, +2 for physical assistance Sit to sidelying: Total assist, +2 for physical assistance General bed mobility comments: Pt was able to advance LE's towards the EOB. Slight assist required for LLE movement. Pt held to the L railing with RUE for support, and +2 assist was provided for trunk elevation to full sitting position. Bed pad used to complete scooting out to EOB.  Transfers Overall transfer level: Needs assistance Equipment used: 2 person hand held assist Transfers: Sit to/from Stand Sit to Stand: Max assist, +2 physical assistance General transfer comment: LLE blocked. Pt required +2 to power-up to full standing position. With max support provided, pt was able to advance RLE towards chair with assist to slide LLE along.  Ambulation/Gait General Gait Details: Unable at this time,    ADL: ADL Overall ADL's : Needs assistance/impaired Eating/Feeding: Minimal assistance, Sitting Grooming: Minimal assistance, Sitting Upper Body Bathing: Moderate assistance, Sitting Lower Body Bathing: Maximal assistance, +2 for physical assistance, Sit to/from stand Upper Body Dressing : Moderate assistance, Sitting Lower Body Dressing: Maximal assistance, +2 for physical assistance, Sit to/from stand Toilet Transfer: Maximal assistance, +2 for physical assistance, Cueing for sequencing, Stand-pivot, BSC (2 person hand held assist) Toilet Transfer Details (indicate cue  type and reason): Simulated by transfer from EOB to chair. Functional mobility during ADLs: Maximal assistance, +2 for physical assistance, Cueing for sequencing (for stand pivot transfer) General ADL Comments: Pts daughter present for OT eval. Educated on LUE positioning techniques, LUE ROM techniques, incorporating LUE into functional activities.  Cognition: Cognition Overall Cognitive Status: History of cognitive impairments - at baseline Orientation Level: Oriented X4 Cognition Arousal/Alertness: Awake/alert Behavior During Therapy: WFL for tasks assessed/performed, Flat affect Overall Cognitive Status: History of cognitive impairments - at baseline Difficult to assess due to: Impaired communication  Physical Exam: Blood pressure 136/66, pulse 80, temperature 98 F (36.7 C), temperature source Oral, resp. rate 20, SpO2 97 %. Physical Exam HENT:  Left facial droop  Eyes: EOM are normal.  Neck: Normal range of motion. Neck supple. No thyromegaly present.  Cardiovascular: Normal rate and regular rhythm. Grade 3 systolic ejection murmur, aortic listening area Respiratory: Effort normal and breath sounds normal. No respiratory distress.  GI: Soft. Bowel sounds are normal. He exhibits no distension.  Neurological:  Patient is alert and pleasantly confused. Moderate dysarthria. Poor historian. He will follow some simple verbal commands.  Skin: Skin is warm and dry. Grade 1 sacral decubitus, hips and heels without evidence of breakdown Motor strength is 0/5 in the left deltoid, biceps, triceps, grip, hip flexor, knee extensor, ankle dorsiflexor. Sensation intact to light touch in the left upper and left lower extremity. Right side  has normal strength and sensation Patient has flexor withdrawal reflex in the left lower extremity   Lab Results Last 48 Hours    Results for orders placed or performed during the hospital encounter of 10/04/15 (from the past 48 hour(s))   Protime-INR Status: None   Collection Time: 10/04/15 2:54 PM  Result Value Ref Range   Prothrombin Time 13.6 11.6 - 15.2 seconds   INR 1.02 0.00 - 1.49  APTT Status: None   Collection Time: 10/04/15 2:54 PM  Result Value Ref Range   aPTT 31 24 - 37 seconds  CBC Status: Abnormal   Collection Time: 10/04/15 2:54 PM  Result Value Ref Range   WBC 6.3 4.0 - 10.5 K/uL   RBC 4.34 4.22 - 5.81 MIL/uL   Hemoglobin 12.8 (L) 13.0 - 17.0 g/dL   HCT 38.9 (L) 39.0 - 52.0 %   MCV 89.6 78.0 - 100.0 fL   MCH 29.5 26.0 - 34.0 pg   MCHC 32.9 30.0 - 36.0 g/dL   RDW 14.9 11.5 - 15.5 %   Platelets 193 150 - 400 K/uL  Differential Status: None   Collection Time: 10/04/15 2:54 PM  Result Value Ref Range   Neutrophils Relative % 71 %   Neutro Abs 4.5 1.7 - 7.7 K/uL   Lymphocytes Relative 17 %   Lymphs Abs 1.1 0.7 - 4.0 K/uL   Monocytes Relative 8 %   Monocytes Absolute 0.5 0.1 - 1.0 K/uL   Eosinophils Relative 3 %   Eosinophils Absolute 0.2 0.0 - 0.7 K/uL   Basophils Relative 1 %   Basophils Absolute 0.1 0.0 - 0.1 K/uL  Comprehensive metabolic panel Status: Abnormal   Collection Time: 10/04/15 2:54 PM  Result Value Ref Range   Sodium 144 135 - 145 mmol/L   Potassium 4.2 3.5 - 5.1 mmol/L   Chloride 102 101 - 111 mmol/L   CO2 29 22 - 32 mmol/L   Glucose, Bld 152 (H) 65 - 99 mg/dL   BUN 21 (H) 6 - 20 mg/dL   Creatinine, Ser 1.27 (H) 0.61 - 1.24 mg/dL   Calcium 9.7 8.9 - 10.3 mg/dL   Total Protein 6.8 6.5 - 8.1 g/dL   Albumin 3.9 3.5 - 5.0 g/dL   AST 33 15 - 41 U/L   ALT 13 (L) 17 - 63 U/L   Alkaline Phosphatase 66 38 - 126 U/L   Total Bilirubin 1.0 0.3 - 1.2 mg/dL   GFR calc non Af Amer 51 (L) >60 mL/min   GFR calc Af Amer 59 (L) >60 mL/min    Comment: (NOTE) The eGFR has been  calculated using the CKD EPI equation. This calculation has not been validated in all clinical situations. eGFR's persistently <60 mL/min signify possible Chronic Kidney Disease.    Anion gap 13 5 - 15  I-Stat Chem 8, ED (not at [facility]) Status: Abnormal   Collection Time: 10/04/15 2:55 PM  Result Value Ref Range   Sodium 142 135 - 145 mmol/L   Potassium 4.1 3.5 - 5.1 mmol/L   Chloride 102 101 - 111 mmol/L   BUN 27 (H) 6 - 20 mg/dL   Creatinine, Ser 1.30 (H) 0.61 - 1.24 mg/dL   Glucose, Bld 144 (H) 65 - 99 mg/dL   Calcium, Ion 1.14 1.13 - 1.30 mmol/L   TCO2 32 0 - 100 mmol/L   Hemoglobin 14.6 13.0 - 17.0 g/dL   HCT 43.0 39.0 - 52.0 %  I-stat troponin, ED (not at  [facility], [facility]) Status: None   Collection Time: 10/04/15 3:03 PM  Result Value Ref Range   Troponin i, poc 0.00 0.00 - 0.08 ng/mL   Comment 3       Comment: Due to the release kinetics of cTnI, a negative result within the first hours of the onset of symptoms does not rule out myocardial infarction with certainty. If myocardial infarction is still suspected, repeat the test at appropriate intervals.   TSH Status: None   Collection Time: 10/04/15 6:45 PM  Result Value Ref Range   TSH 2.225 0.350 - 4.500 uIU/mL  Hemoglobin A1c Status: Abnormal   Collection Time: 10/05/15 5:10 AM  Result Value Ref Range   Hgb A1c MFr Bld 8.1 (H) 4.8 - 5.6 %    Comment: (NOTE)  Pre-diabetes: 5.7 - 6.4  Diabetes: >6.4  Glycemic control for adults with diabetes: <7.0    Mean Plasma Glucose 186 mg/dL    Comment: (NOTE) Performed At: [facility] [place], [place] [id number] Lindon Romp MD [id number]   Lipid panel Status: Abnormal   Collection Time: 10/05/15 5:10 AM  Result Value Ref Range   Cholesterol 196 0 - 200 mg/dL   Triglycerides 130  <150 mg/dL   HDL 45 >40 mg/dL   Total CHOL/HDL Ratio 4.4 RATIO   VLDL 26 0 - 40 mg/dL   LDL Cholesterol 125 (H) 0 - 99 mg/dL    Comment:   Total Cholesterol/HDL:CHD Risk Coronary Heart Disease Risk Table  Men Women 1/2 Average Risk 3.4 3.3 Average Risk 5.0 4.4 2 X Average Risk 9.6 7.1 3 X Average Risk 23.4 11.0   Use the calculated Patient Ratio above and the CHD Risk Table to determine the patient's CHD Risk.   ATP III CLASSIFICATION (LDL): <100 mg/dL Optimal 100-129 mg/dL Near or Above  Optimal 130-159 mg/dL Borderline 160-189 mg/dL High >190 mg/dL Very High   Glucose, capillary Status: Abnormal   Collection Time: 10/05/15 6:58 AM  Result Value Ref Range   Glucose-Capillary 145 (H) 65 - 99 mg/dL   Comment 1 Notify RN    Comment 2 Document in Chart   Vitamin B12 Status: None   Collection Time: 10/05/15 9:52 AM  Result Value Ref Range   Vitamin B-12 574 180 - 914 pg/mL    Comment: (NOTE) This assay is not validated for testing neonatal or myeloproliferative syndrome specimens for Vitamin B12 levels.   Glucose, capillary Status: Abnormal   Collection Time: 10/05/15 4:24 PM  Result Value Ref Range   Glucose-Capillary 172 (H) 65 - 99 mg/dL   Comment 1 Notify RN    Comment 2 Document in Chart   Glucose, capillary Status: Abnormal   Collection Time: 10/05/15 9:09 PM  Result Value Ref Range   Glucose-Capillary 214 (H) 65 - 99 mg/dL   Comment 1 Notify RN    Comment 2 Document in Chart   Glucose, capillary Status: Abnormal   Collection Time: 10/06/15 6:12 AM  Result Value Ref Range   Glucose-Capillary 212 (H) 65 - 99 mg/dL   Comment 1 Notify RN    Comment 2 Document in Chart       Imaging Results (Last 48 hours)    Ct Angio Head  W/cm &/or Wo Cm  10/05/2015 CLINICAL DATA: Stroke. EXAM: CT ANGIOGRAPHY HEAD AND NECK TECHNIQUE: Multidetector CT imaging of the head and neck was performed using the standard protocol during bolus administration of intravenous contrast. Multiplanar CT image reconstructions and MIPs were  obtained to evaluate the vascular anatomy. Carotid stenosis measurements (when applicable) are obtained utilizing NASCET criteria, using the distal internal carotid diameter as the denominator. CONTRAST: 56m OMNIPAQUE IOHEXOL 350 MG/ML SOLN COMPARISON: Head CT from yesterday FINDINGS: CTA NECK Aortic arch: No aneurysm or dissection. 2 vessel branching. Extensive atherosclerotic plaque mixed area Right carotid system: Diffuse atheromatous wall thickening. No flow limiting (50% or greater) stenosis. No dissection. Left carotid system:Diffuse atheromatous wall thickening. No flow limiting (50% or greater) stenosis. Vertebral arteries: No dissection. No proximal subclavian flow limiting stenosis. Left vertebral artery dominance. No evidence of dissection. High-grade right ostial stenosis. Skeleton: No contributory findings Other neck: Patchy ground-glass and nodular opacities in the right upper lobe with a infectious/ inflammatory appearance CTA HEAD Anterior circulation: Symmetric carotid arteries. Hypoplastic right A1 segment with anterior communicating artery. Bilateral posterior communicating arteries. No proximal occlusion or flow limiting stenosis. Atherosclerotic calcification on the carotid siphons. High-grade distal left M2 branch stenosis best seen on coronal reformats image 106. Elsewhere atherosclerotic irregularity and narrowing is mild to moderate. Posterior circulation: Left dominant. Early branching right PICA. The right V4 segment diffuse atherosclerotic type irregularity, with minimal if any contribution to the basilar. These narrowings are beyond the PICA origin. Atherosclerotic irregularity of the basilar which  is narrow in the setting of sizable posterior communicating arteries. No major branch occlusion. Congenitally small left P1 segment with superimposed atherosclerotic irregularity. Best seen on coronal reformats there is moderate stenosis of right P4 branch. Negative for aneurysm. Venous sinuses: Patent Anatomic variants: Intact circle-of-Willis Delayed phase: Not performed IMPRESSION: 1. No acute arterial finding. 2. Cervical and vertebral artery atherosclerosis. High-grade non dominant right vertebral ostium and V4 stenoses. 3. Intracranial atherosclerosis as described. 4. Patchy opacity in the right lung with inflammatory appearance. Correlate for symptoms of active pneumonitis/pneumonia. Electronically Signed By: JMonte FantasiaM.D. On: 10/05/2015 11:31   Dg Chest 2 View  10/04/2015 CLINICAL DATA: Stroke symptoms since 1100 hrs today, hx diabetic, hx hypertension EXAM: CHEST 2 VIEW COMPARISON: 08/12/2015 FINDINGS: Cardiac silhouette normal in size and configuration. No mediastinal or hilar masses or evidence of adenopathy. Previously seen airspace opacities have resolved. Lungs are mildly hyperexpanded. There are mildly prominent bronchovascular markings which are stable. No evidence of pneumonia or edema. No pleural effusion or pneumothorax. Bony thorax is demineralized but grossly intact. IMPRESSION: No acute cardiopulmonary disease. Electronically Signed By: DLajean ManesM.D. On: 10/04/2015 17:58   Ct Head Wo Contrast  10/04/2015 ADDENDUM REPORT: 10/04/2015 15:18 ADDENDUM: Return call at 3:18 p.m. with direct communication with Dr. NSilverio Decamp Electronically Signed By: KAbigail MiyamotoM.D. On: 10/04/2015 15:18  10/04/2015 CLINICAL DATA: Slurred speech. Left-sided facial droop. Hypertension and diabetes. EXAM: CT HEAD WITHOUT CONTRAST TECHNIQUE: Contiguous axial images were obtained from the base of the skull through the vertex without intravenous contrast. COMPARISON: MRI of 03/12/2014.  FINDINGS: Sinuses/Soft tissues: Surgical changes about both globes. Minimal mucosal thickening of ethmoid air cells. Hypoplastic frontal sinuses. Clear mastoid air cells. Intracranial: Expected cerebral volume loss for age. Mild for age low density in the periventricular white matter likely related to small vessel disease. Bilateral vertebral and carotid intracranial atherosclerosis. No mass lesion, hemorrhage, hydrocephalus, acute infarct, intra-axial, or extra-axial fluid collection. IMPRESSION: No acute intracranial abnormality. The referring physician was paged at 3 p.m. and a return call is pending. Electronically Signed: By: KAbigail MiyamotoM.D. On: 10/04/2015 15:01   Ct Angio Neck W/cm &/or Wo/cm  10/05/2015 CLINICAL DATA: Stroke. EXAM: CT ANGIOGRAPHY HEAD AND NECK TECHNIQUE: Multidetector CT imaging  of the head and neck was performed using the standard protocol during bolus administration of intravenous contrast. Multiplanar CT image reconstructions and MIPs were obtained to evaluate the vascular anatomy. Carotid stenosis measurements (when applicable) are obtained utilizing NASCET criteria, using the distal internal carotid diameter as the denominator. CONTRAST: 100m OMNIPAQUE IOHEXOL 350 MG/ML SOLN COMPARISON: Head CT from yesterday FINDINGS: CTA NECK Aortic arch: No aneurysm or dissection. 2 vessel branching. Extensive atherosclerotic plaque mixed area Right carotid system: Diffuse atheromatous wall thickening. No flow limiting (50% or greater) stenosis. No dissection. Left carotid system:Diffuse atheromatous wall thickening. No flow limiting (50% or greater) stenosis. Vertebral arteries: No dissection. No proximal subclavian flow limiting stenosis. Left vertebral artery dominance. No evidence of dissection. High-grade right ostial stenosis. Skeleton: No contributory findings Other neck: Patchy ground-glass and nodular opacities in the right upper lobe with a infectious/ inflammatory appearance CTA  HEAD Anterior circulation: Symmetric carotid arteries. Hypoplastic right A1 segment with anterior communicating artery. Bilateral posterior communicating arteries. No proximal occlusion or flow limiting stenosis. Atherosclerotic calcification on the carotid siphons. High-grade distal left M2 branch stenosis best seen on coronal reformats image 106. Elsewhere atherosclerotic irregularity and narrowing is mild to moderate. Posterior circulation: Left dominant. Early branching right PICA. The right V4 segment diffuse atherosclerotic type irregularity, with minimal if any contribution to the basilar. These narrowings are beyond the PICA origin. Atherosclerotic irregularity of the basilar which is narrow in the setting of sizable posterior communicating arteries. No major branch occlusion. Congenitally small left P1 segment with superimposed atherosclerotic irregularity. Best seen on coronal reformats there is moderate stenosis of right P4 branch. Negative for aneurysm. Venous sinuses: Patent Anatomic variants: Intact circle-of-Willis Delayed phase: Not performed IMPRESSION: 1. No acute arterial finding. 2. Cervical and vertebral artery atherosclerosis. High-grade non dominant right vertebral ostium and V4 stenoses. 3. Intracranial atherosclerosis as described. 4. Patchy opacity in the right lung with inflammatory appearance. Correlate for symptoms of active pneumonitis/pneumonia. Electronically Signed By: JMonte FantasiaM.D. On: 10/05/2015 11:31   Mr Brain Wo Contrast  10/05/2015 CLINICAL DATA: 82year old hypertensive diabetic male with facial asymmetry, slurred speech and left-sided weakness. Subsequent encounter. EXAM: MRI HEAD WITHOUT CONTRAST TECHNIQUE: Multiplanar, multiecho pulse sequences of the brain and surrounding structures were obtained without intravenous contrast. COMPARISON: CT angiogram 10/05/2015. Head CT 10/04/2015. Brain MR 03/12/2014. FINDINGS: Exam is motion degraded. Moderate-size acute  nonhemorrhagic infarct extends from the posterior right lenticular nucleus extending to posterior right corona radiata. Tiny area blood breakdown products posterior left frontal lobe most consistent with result prior hemorrhagic ischemia. Remote small cerebellar infarcts bilaterally, right paracentral pontine infarct, basal ganglia infarcts, left thalamic infarct and small infarcts centrum semi ovale greater on the right. Moderate chronic microvascular changes. Global moderate atrophy without hydrocephalus. No intracranial mass lesion noted on this unenhanced exam. Abnormal appearance right vertebral artery. Please see CT angiogram report. Post lens replacement. Cervical medullary junction and pineal region unremarkable. IMPRESSION: Moderate-size acute nonhemorrhagic infarct extends from the posterior right lenticular nucleus extending to posterior right corona radiata. Remote small cerebellar infarcts bilaterally, right paracentral pontine infarct, basal ganglia infarcts, left thalamic infarct and small infarcts centrum semi ovale greater on the right. Moderate chronic microvascular changes. Global moderate atrophy without hydrocephalus. Abnormal appearance right vertebral artery. Please see CT angiogram report. Electronically Signed By: SGenia DelM.D. On: 10/05/2015 13:33   Dg Swallowing Func-speech Pathology  10/05/2015 Objective Swallowing Evaluation: Type of Study: MBS-Modified Barium Swallow Study Patient Details Name: LLyndel SarateMRN: [id number] of  Birth: March 27, 1931 Today's Date: 10/05/2015 Time: SLP Start Time (ACUTE ONLY): 1056-SLP Stop Time (ACUTE ONLY): 1119 SLP Time Calculation (min) (ACUTE ONLY): 23 min Past Medical History: Past Medical History Diagnosis Date . Diabetes ([place]) . Hypertension . Gout . ED (erectile dysfunction) . Heart murmur . Kidney stones . Dysphagia Past Surgical History: Past Surgical History Procedure Laterality Date . Tonsillectomy . Appendectomy .  Lithotripsy . Uvulopalatopharyngoplasty for snoring HPI: Pt is an 82 y/o male who presents with slurred speech, L facial droop and L sided weakness. CT was negative for acute changes, and MRI is currently pending.  Subjective: patient upright in bed with daughter at bedside Assessment / Plan / Recommendation CHL IP CLINICAL IMPRESSIONS 10/05/2015 Therapy Diagnosis Severe oral phase dysphagia;Moderate pharyngeal phase dysphagia;Other (Comment) Clinical Impression Patient presents with baseline mild dysphagia, which is now compounded by new onset of left sided weakness. This results in primarily motor based deficits. Mastication of soft solids is prolonged and results in severe vallecular residue. When textures are modified to Dys.1 textures patient able to reduce residue with 3 swallows. Nectar-thick liquids allowed extra time for sluggish swallow response and modification of use of teaspoon ensured small portions which resulted in penetration that either flashed or was sensed and effectively cleared pharynx with throat clears. Recommend initiation of Dys.1 textures and nectar-thick liquids with full supervision and strict use of modifications and safe swallow strategies to maximize swallow safety. Education completed with teach back from patient, daughter and wife. SLP will follow acutely and daughter requests inpatient rehab for follow up therapies; SLP in agreement.  Impact on safety and function Mild aspiration risk CHL IP TREATMENT RECOMMENDATION 10/05/2015 Treatment Recommendations Therapy as outlined in treatment plan below Prognosis 10/05/2015 Prognosis for Safe Diet Advancement Good Barriers to Reach Goals Cognitive deficits;Time post onset;Severity of deficits Barriers/Prognosis Comment -- [facility] IP DIET RECOMMENDATION 10/05/2015 SLP Diet Recommendations Dysphagia 1 (Puree) solids;Nectar thick liquid Liquid Administration via Spoon Medication Administration Crushed with puree Compensations  Minimize environmental distractions;Slow rate;Small sips/bites;Multiple dry swallows after each bite/sip;Follow solids with liquid;Clear throat intermittently Postural Changes Seated upright at 90 degrees;Remain semi-upright after after feeds/meals (Comment) [facility]L IP OTHER RECOMMENDATIONS 10/05/2015 Recommended Consults -- Oral Care Recommendations Oral care BID Other Recommendations Order thickener from pharmacy;Prohibited food (jello, ice cream, thin soups);Remove water pitcher;Have oral suction available [facility]L IP FOLLOW UP RECOMMENDATIONS 10/05/2015 Follow up Recommendations 24 hour supervision/assistance;Inpatient Rehab CHL IP FREQUENCY AND DURATION 10/05/2015 Speech Therapy Frequency (ACUTE ONLY) min 2x/week Treatment Duration 2 weeks CHL IP ORAL PHASE 10/05/2015 Oral Phase Impaired Oral - Pudding Teaspoon -- Oral - Pudding Cup -- Oral - Honey Teaspoon -- Oral - Honey Cup -- Oral - Nectar Teaspoon Weak lingual manipulation;Incomplete tongue to palate contact;Reduced posterior propulsion;Holding of bolus;Lingual/palatal residue;Piecemeal swallowing;Delayed oral transit;Decreased bolus cohesion;Premature spillage Oral - Nectar Cup Left anterior bolus loss;Weak lingual manipulation;Lingual/palatal residue;Piecemeal swallowing;Delayed oral transit;Decreased bolus cohesion;Premature spillage Oral - Nectar Straw -- Oral - Thin Teaspoon -- Oral - Thin Cup Left anterior bolus loss;Weak lingual manipulation;Incomplete tongue to palate contact;Reduced posterior propulsion;Holding of bolus;Lingual/palatal residue;Piecemeal swallowing;Delayed oral transit;Decreased bolus cohesion;Premature spillage Oral - Thin Straw -- Oral - Puree Weak lingual manipulation;Incomplete tongue to palate contact;Reduced posterior propulsion;Holding of bolus;Lingual/palatal residue;Piecemeal swallowing;Delayed oral transit;Premature spillage Oral - Mech Soft Impaired mastication;Weak lingual manipulation;Incomplete tongue to palate  contact;Reduced posterior propulsion;Holding of bolus;Left pocketing in lateral sulci;Lingual/palatal residue;Piecemeal swallowing;Delayed oral transit;Premature spillage Oral - Regular -- Oral - Multi-Consistency -- Oral - Pill -- Oral Phase - Comment -- [facility] IP PHARYNGEAL PHASE  10/05/2015 Pharyngeal Phase Impaired Pharyngeal- Pudding Teaspoon -- Pharyngeal -- Pharyngeal- Pudding Cup -- Pharyngeal -- Pharyngeal- Honey Teaspoon -- Pharyngeal -- Pharyngeal- Honey Cup -- Pharyngeal -- Pharyngeal- Nectar Teaspoon Delayed swallow initiation-vallecula;Reduced epiglottic inversion;Reduced tongue base retraction;Reduced laryngeal elevation;Reduced airway/laryngeal closure;Penetration/Aspiration during swallow;Pharyngeal residue - valleculae;Pharyngeal residue - pyriform Pharyngeal Material enters airway, remains ABOVE vocal cords then ejected out;Material enters airway, remains ABOVE vocal cords and not ejected out;Other (Comment) Pharyngeal- Nectar Cup Delayed swallow initiation-pyriform sinuses;Reduced epiglottic inversion;Reduced laryngeal elevation;Reduced airway/laryngeal closure;Reduced tongue base retraction;Penetration/Aspiration before swallow;Penetration/Aspiration during swallow;Penetration/Apiration after swallow;Moderate aspiration;Pharyngeal residue - valleculae;Pharyngeal residue - pyriform Pharyngeal Material enters airway, CONTACTS cords and not ejected out;Other (Comment);Material enters airway, passes BELOW cords and not ejected out despite cough attempt by patient Pharyngeal- Nectar Straw -- Pharyngeal -- Pharyngeal- Thin Teaspoon -- Pharyngeal -- Pharyngeal- Thin Cup Delayed swallow initiation-pyriform sinuses;Reduced epiglottic inversion;Reduced laryngeal elevation;Reduced airway/laryngeal closure;Reduced tongue base retraction;Penetration/Aspiration before swallow;Penetration/Aspiration during swallow;Penetration/Apiration after swallow;Moderate aspiration;Pharyngeal residue - valleculae;Pharyngeal  residue - pyriform Pharyngeal Material enters airway, passes BELOW cords and not ejected out despite cough attempt by patient Pharyngeal- Thin Straw -- Pharyngeal -- Pharyngeal- Puree Delayed swallow initiation-vallecula;Reduced epiglottic inversion;Reduced laryngeal elevation;Reduced tongue base retraction;Pharyngeal residue - valleculae;Pharyngeal residue - pyriform;Other (Comment) Pharyngeal -- Pharyngeal- Mechanical Soft Delayed swallow initiation-vallecula;Reduced laryngeal elevation;Reduced epiglottic inversion;Reduced tongue base retraction;Pharyngeal residue - valleculae;Pharyngeal residue - pyriform;Other (Comment) Pharyngeal -- Pharyngeal- Regular -- Pharyngeal -- Pharyngeal- Multi-consistency -- Pharyngeal -- Pharyngeal- Pill -- Pharyngeal -- Pharyngeal Comment -- CHL IP CERVICAL ESOPHAGEAL PHASE 10/05/2015 Cervical Esophageal Phase WFL Pudding Teaspoon -- Pudding Cup -- Honey Teaspoon -- Honey Cup -- Nectar Teaspoon -- Nectar Cup -- Nectar Straw -- Thin Teaspoon -- Thin Cup -- Thin Straw -- Puree -- Mechanical Soft -- Regular -- Multi-consistency -- Pill -- Cervical Esophageal Comment -- CH[facility] IP GO 03/12/2014 Functional Assessment Tool Used clinical judgement Functional Limitations Swallowing Swallow Current Status (Y4825) CK Swallow Goal Status (O0370) CK Swallow Discharge Status (W8889) CK Motor Speech Current Status (V6945) (None) Motor Speech Goal Status (W3888) (None) Motor Speech Goal Status (K8003) (None) Spoken Language Comprehension Current Status (K9179) (None) Spoken Language Comprehension Goal Status (X5056) (None) Spoken Language Comprehension Discharge Status (P7948) (None) Spoken Language Expression Current Status (A1655) (None) Spoken Language Expression Goal Status (V7482) (None) Spoken Language Expression Discharge Status [phone]) (None) Attention Current Status (L5449) (None) Attention Goal Status (E0100) (None) Attention Discharge Status (F1219) (None) Memory Current Status (X5883) (None)  Memory Goal Status (G5498) (None) Memory Discharge Status (Y6415) (None) Voice Current Status (A3094) (None) Voice Goal Status (M7680) (None) Voice Discharge Status (S8110) (None) Other Speech-Language Pathology Functional Limitation (R1594) (None) Other Speech-Language Pathology Functional Limitation Goal Status (V8592) (None) Other Speech-Language Pathology Functional Limitation Discharge Status [phone]) (None) Carmelia Roller., CCC-SLP [phone] [place] 10/05/2015, 2:49 PM        Medical Problem List and Plan: 1. 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 DVT Prophylaxis/Anticoagulation: Subcutaneous heparin. Monitor platelet count and any signs of bleeding 3. Pain Management: Tylenol as needed 4. Mood/dementia: Discuss cognitive baseline with family. Resumed Namenda 5. Neuropsych: This patient is capable of making decisions on his own behalf. 6. Skin/Wound Care: Routine skin checks 7. Fluids/Electrolytes/Nutrition: Routine I&O's with follow-up chemistries 8. Hypertension. No current antihypertensive medication. Patient on Cozaar 100 mg daily prior to admission. Resume as needed 9. Diabetes mellitus of peripheral neuropathy. Hemoglobin A1c 8.1. Check blood sugars before meals and at bedtime. Patient on Glucophage ex RR 500 mg twice daily prior  to admission and resume as tolerated 10. Hyperlipidemia. Lipitor 11. BPH. Check PVRs 3. Resumed Proscar 5 mg daily 12. History of gout.Colchicine. Monitor for any gout flareup  Post Admission Physician Evaluation: 1. 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 2. Patient is admitted to receive collaborative, interdisciplinary care between the physiatrist, rehab nursing staff, and therapy team. 3. Patient's level of medical complexity and substantial therapy needs in  context of that medical necessity cannot be provided at a lesser intensity of care such as a SNF. 4. Patient has experienced substantial functional loss from his/her baseline which was documented above under the "Functional History" and "Functional Status" headings. Judging by the patient's diagnosis, physical exam, and functional history, the patient has potential for functional progress which will result in measurable gains while on inpatient rehab. These gains will be of substantial and practical use upon discharge in facilitating mobility and self-care at the household level. 5. Physiatrist will provide 24 hour management of medical needs as well as oversight of the therapy plan/treatment and provide guidance as appropriate regarding the interaction of the two. 6. 24 hour rehab nursing will assist with bladder management, bowel management, safety, skin/wound care, disease management, medication administration, pain management and patient education and help integrate therapy concepts, techniques,education, etc. 7. PT will assess and treat for/with: pre gait, gait training, endurance , safety, equipment, neuromuscular re education. Goals are: Sup/minA, WC level. 8. OT will assess and treat for/with: ADLs, Cognitive perceptual skills, Neuromuscular re education, safety, endurance, equipment. Goals are: Min/Mod A. Therapy May proceed with showering this patient. 9. SLP will assess and treat for/with: Memory, attention, problem solving, medication management. Goals are: Min assist. 10. Case Management and Social Worker will assess and treat for psychological issues and discharge planning. 11. Team conference will be held weekly to assess progress toward goals and to determine barriers to discharge. 12. Patient will receive at least 3 hours of therapy per day at least 5 days per week. 13. ELOS: 18-22d  14. Prognosis: good     Charlett Blake M.D. [facility] FAAPM&R  (Sports Med, Neuromuscular Med) Diplomate Am Board of Electrodiagnostic Med  10/06/2015

## 2015-10-08 ENCOUNTER — Inpatient Hospital Stay (HOSPITAL_COMMUNITY): Payer: Medicare Other | Admitting: Physical Therapy

## 2015-10-08 ENCOUNTER — Inpatient Hospital Stay (HOSPITAL_COMMUNITY): Payer: Medicare Other

## 2015-10-08 ENCOUNTER — Inpatient Hospital Stay (HOSPITAL_COMMUNITY): Payer: Medicare Other | Admitting: Speech Pathology

## 2015-10-08 DIAGNOSIS — E1142 Type 2 diabetes mellitus with diabetic polyneuropathy: Secondary | ICD-10-CM | POA: Insufficient documentation

## 2015-10-08 DIAGNOSIS — I69991 Dysphagia following unspecified cerebrovascular disease: Secondary | ICD-10-CM | POA: Insufficient documentation

## 2015-10-08 DIAGNOSIS — I69959 Hemiplegia and hemiparesis following unspecified cerebrovascular disease affecting unspecified side: Secondary | ICD-10-CM

## 2015-10-08 DIAGNOSIS — E1149 Type 2 diabetes mellitus with other diabetic neurological complication: Secondary | ICD-10-CM

## 2015-10-08 DIAGNOSIS — I69993 Ataxia following unspecified cerebrovascular disease: Secondary | ICD-10-CM

## 2015-10-08 DIAGNOSIS — IMO0002 Reserved for concepts with insufficient information to code with codable children: Secondary | ICD-10-CM | POA: Insufficient documentation

## 2015-10-08 DIAGNOSIS — I10 Essential (primary) hypertension: Secondary | ICD-10-CM | POA: Insufficient documentation

## 2015-10-08 DIAGNOSIS — I6350 Cerebral infarction due to unspecified occlusion or stenosis of unspecified cerebral artery: Secondary | ICD-10-CM

## 2015-10-08 DIAGNOSIS — G8190 Hemiplegia, unspecified affecting unspecified side: Secondary | ICD-10-CM

## 2015-10-08 DIAGNOSIS — I69359 Hemiplegia and hemiparesis following cerebral infarction affecting unspecified side: Secondary | ICD-10-CM

## 2015-10-08 DIAGNOSIS — F0390 Unspecified dementia without behavioral disturbance: Secondary | ICD-10-CM

## 2015-10-08 DIAGNOSIS — N179 Acute kidney failure, unspecified: Secondary | ICD-10-CM

## 2015-10-08 LAB — GLUCOSE, CAPILLARY
GLUCOSE-CAPILLARY: 258 mg/dL — AB (ref 65–99)
Glucose-Capillary: 236 mg/dL — ABNORMAL HIGH (ref 65–99)
Glucose-Capillary: 251 mg/dL — ABNORMAL HIGH (ref 65–99)
Glucose-Capillary: 267 mg/dL — ABNORMAL HIGH (ref 65–99)

## 2015-10-08 NOTE — Progress Notes (Signed)
Refused to wear his CPAP this shift. Respiratory therapist came to assist patient put them on.

## 2015-10-08 NOTE — Plan of Care (Signed)
Problem: RH SKIN INTEGRITY Goal: RH STG SKIN FREE OF INFECTION/BREAKDOWN Timed toileting to prevent MASD; assist to turn and reposition q2-3 hrs.

## 2015-10-08 NOTE — Care Management Note (Signed)
[facility]patient [facility] Individual Statement of Services  Patient Name:  Roger [name]  Date:  10/08/2015  Welcome to the [facility].  Our goal is to provide you with an individualized program based on your diagnosis and situation, designed to meet your specific needs.  With this comprehensive rehabilitation program, you will be expected to participate in at least 3 hours of rehabilitation therapies Monday-Friday, with modified therapy programming on the weekends.  Your rehabilitation program will include the following services:  Physical Therapy (PT), Occupational Therapy (OT), Speech Therapy (ST), 24 hour per day rehabilitation nursing, Therapeutic Recreaction (TR), Case Management (Social Worker), Rehabilitation Medicine, Nutrition Services and Pharmacy Services  Weekly team conferences will be held on Wednesday to discuss your progress.  Your Social Worker will talk with you frequently to get your input and to update you on team discussions.  Team conferences with you and your family in attendance may also be held.  Expected length of stay: 20-25 days Overall anticipated outcome: min/mod level of assist  Depending on your progress and recovery, your program may change. Your Social Worker will coordinate services and will keep you informed of any changes. Your Social Worker's name and contact numbers are listed  below.  The following services may also be recommended but are not provided by the Inpatient Rehabilitation Center:   Driving Evaluations  Home Health Rehabiltiation Services  Outpatient Rehabilitation Services    Arrangements will be made to provide these services after discharge if needed.  Arrangements include referral to agencies that provide these services.  Your insurance has been verified to be:  [employer]-Medicare Your primary doctor is:  Dr Kevan Ny  Pertinent information will be shared with your doctor and your insurance company.  Social Worker:   Dossie Der, SW [phone] or [phone]  Information discussed with and copy given to patient by: Lucy Chris, 10/08/2015, 10:30 AM

## 2015-10-08 NOTE — Progress Notes (Signed)
Social Work  Social Work Assessment and Plan  Patient Details  Name: Roger [name] MRN: [id number] Date of Birth: 21-Apr-1931  Today's Date: 10/08/2015  Problem List:  Patient Active Problem List   Diagnosis Date Noted  . AKI (acute kidney injury) (HCC)   . Benign essential HTN   . Dementia   . DM type 2 with diabetic peripheral neuropathy (HCC)   . Hemiplegia affecting non-dominant side, post-stroke (HCC)   . Dysphagia as late effect of cerebrovascular disease   . Basal ganglia infarction (HCC) 10/07/2015  . Acute left hemiparesis (HCC)   . Gait disturbance, post-stroke   . HLD (hyperlipidemia)   . Slurred speech 10/04/2015  . Malignant hypertension 10/04/2015  . Diabetes (HCC) 10/04/2015  . Stroke (HCC) 10/04/2015  . Left-sided weakness   . Stroke-like symptoms   . Physical deconditioning   . Diabetes mellitus with complication (HCC)   . Alzheimer's disease 08/14/2013   Past Medical History:  Past Medical History  Diagnosis Date  . Diabetes (HCC)   . Hypertension   . Gout   . ED (erectile dysfunction)   . Heart murmur   . Kidney stones   . Dysphagia    Past Surgical History:  Past Surgical History  Procedure Laterality Date  . Tonsillectomy    . Appendectomy    . Lithotripsy    . Uvulopalatopharyngoplasty      for snoring   Social History:  reports that he has never smoked. He does not have any smokeless tobacco history on file. He reports that he does not drink alcohol or use illicit drugs.  Family / Support Systems Marital Status: Married Patient Roles: Spouse, Parent Spouse/Significant Other: Christine [phone] Children: Corky Mull [phone] Other Supports: Son in-law Anticipated Caregiver: Wife and daughter-await progress may hire assist also Ability/Limitations of Caregiver: Wife and daughter both have bad backs so can not physically assist pt if needed. Caregiver Availability: 24/7 Family Dynamics: Close knit with daughter,  step-daughter and son in-law. Daughter goes back and forth between [place] but plans to stay here for the time being for Dad. Pt's step-daughter lives in [place], so just wife and daughter along with daughter's husband.  Social History Preferred language: English Religion: Baptist Cultural Background: No issues Education: Some college Read: Yes Write: Yes Employment Status: Retired Fish farm manager Issues: No issues Guardian/Conservator: none-according to MD pt is capable of making his own decisions while here. Will make sure wife or daughter are here if any decisions need to be made due to pt is pleasantly confused at this time.   Abuse/Neglect Physical Abuse: Denies Verbal Abuse: Denies Sexual Abuse: Denies Exploitation of patient/patient's resources: Denies Self-Neglect: Denies  Emotional Status Pt's affect, behavior adn adjustment status: Pt is motivated but at this time very tired from first therapy. He still needs to adjust to the rehab program, this is his first day. he wants to recover and be able to do what he was doing prior to admission, he was independent and gardened and kept busy. Daughter confirms this and reports he did take a nap during the day. Recent Psychosocial Issues: Other health issues-dementia was still driving short distances  Pyschiatric History: No history deferred depression screen at this time due to pt is tired and falling asleep from first therapy. Will allow him to get used to the program and see if he would benefit from neuro-psych seeing while here. Substance Abuse History: No issues  Patient / Family Perceptions, Expectations & Goals Pt/Family understanding  of illness & functional limitations: Pt and daughter can explain his stroke and deficits. He is making progress which encourages him. They have spoken with the MD and feel their questions and concerns are being addressed. Daughter or wife plans to be here daily to provide  support and encouragment. Premorbid pt/family roles/activities: husband, father, grandfather, retiree, gardener, church member, Catering manager Anticipated changes in roles/activities/participation: resume Pt/family expectations/goals: Pt states: " I want to do for myself."  Daughter states: " I hope he can get mobile and then we can handle him, we can't do any lifting."  [employer]: None Premorbid Home Care/DME Agencies: None Transportation available at discharge: Nurse, adult and daughter Resource referrals recommended: Support group (specify)  Discharge Planning Living Arrangements: Spouse/significant other Support Systems: Spouse/significant other, Children, Manufacturing engineer, Psychologist, clinical community Type of Residence: Private residence Community education officer Resources: Media planner (specify) Investment banker, operational) Financial Resources: Restaurant manager, fast food Screen Referred: No Living Expenses: Own Money Management: Spouse Does the patient have any problems obtaining your medications?: No Home Management: Wife, pt does outside work Associate Professor Plans: Return home with wife and daughter's assistance if needed. If pt requires physical care, will need to pursue alternatives hired assistance. Wife and daughter plan to be here daily to provide support and see his progress, he is slightly confused and it is helpful to have a fmaily member here for him. Social Work Anticipated Follow Up Needs: HH/OP, Support Group  Clinical Impression Pleasant sleepy gentleman who is answering this worker's questions between falling asleep. Daughter is here to observe Dad in therapies and help provide information to complete evaluations. Aware pt will require some Type of assistance at discharge and wife and daughter are discussing options-ie hiring assist. Will await therapy evaluations and develop a safe discharge plan. Family plans to be very involved throughout pt's stay. Will assess if  would benefit from neuro-psych services while here and make referral.  Lucy Chris 10/08/2015, 10:25 AM

## 2015-10-08 NOTE — Progress Notes (Signed)
Rt Note: Called to place pt on CPAP. Pt has home machine at bedside and states he doesn't think he wants it. I told him to call if he changes his mind.

## 2015-10-08 NOTE — Evaluation (Signed)
Physical Therapy Assessment and Plan  Patient Details  Name: Roger [name] MRN: [id number] Date of Birth: 03-14-1931  PT Diagnosis: Abnormality of gait, Cognitive deficits, Difficulty walking, Hemiplegia non-dominant, Impaired cognition, Impaired sensation and Muscle weakness Rehab Potential: Good ELOS: 21-25 days   Today's Date: 10/08/2015 PT Individual Time: 0762-2633 and 1300-1327 PT Individual Time Calculation (min): 68 min and 27 minutes    Problem List:  Patient Active Problem List   Diagnosis Date Noted  . AKI (acute kidney injury) ([place])   . Benign essential HTN   . Dementia   . DM type 2 with diabetic peripheral neuropathy ([place])   . Hemiplegia affecting non-dominant side, post-stroke ([place])   . Dysphagia as late effect of cerebrovascular disease   . Basal ganglia infarction ([place]) 10/07/2015  . Acute left hemiparesis ([place])   . Gait disturbance, post-stroke   . HLD (hyperlipidemia)   . Slurred speech 10/04/2015  . Malignant hypertension 10/04/2015  . Diabetes ([place]) 10/04/2015  . Stroke ([place]) 10/04/2015  . Left-sided weakness   . Stroke-like symptoms   . Physical deconditioning   . Diabetes mellitus with complication ([place])   . Alzheimer's disease 08/14/2013    Past Medical History:  Past Medical History  Diagnosis Date  . Diabetes ([place])   . Hypertension   . Gout   . ED (erectile dysfunction)   . Heart murmur   . Kidney stones   . Dysphagia    Past Surgical History:  Past Surgical History  Procedure Laterality Date  . Tonsillectomy    . Appendectomy    . Lithotripsy    . Uvulopalatopharyngoplasty      for snoring    Assessment & Plan Clinical Impression: Patient is a 82 y.o. year old male, right handed, with history of hypertension, diabetes mellitus and dementia. Patient lives with spouse independent with assistive device prior to admission. One level home with 2 steps to entry. He was driving short distances prior to admission. Presented 10/04/2015 with  left-sided weakness. MRI of the brain shows moderate size acute nonhemorrhagic infarct extending from the posterior right lenticular nuclear was extending into posterior right corona radiata. Remote small cerebellar infarcts bilaterally, right paracentral pontine infarct, basal ganglia infarct, left thalamic infarct and small infarcts Centrum semi-ovale greater on the right. CTA of the head with no acute arterial finding. Echocardiogram showed grade 1 diastolic dysfunction, no wall motion abnormalities. Systolic function was normal. Patient did not receive TPA. Neurology consulted presently on Plavix for CVA prophylaxis. Subcutaneous heparin for DVT prophylaxis. Dysphagia #1 nectar thick liquid diet. Physical and occupational therapy evaluation completed 10/05/2015 with recommendations of physical medicine rehabilitation consult. Patient was admitted for comprehensive rehabilitation program.  Patient transferred to CIR on 10/07/2015 .   Patient currently requires total with mobility secondary to muscle weakness and decreased muscle activation.  Prior to hospitalization, patient was independent  with mobility with [facility] and lived with Spouse in a House home.  Home access is 2Stairs to enter.  Patient will benefit from skilled PT intervention to maximize safe functional mobility, minimize fall risk and decrease caregiver burden for planned discharge home with 24 hour assist.  Anticipate patient will benefit from follow up [facility] at discharge.  PT - End of Session Activity Tolerance: Tolerates 30+ min activity with multiple rests Endurance Deficit: Yes PT Assessment Rehab Potential (ACUTE/IP ONLY): Good PT Patient demonstrates impairments in the following area(s): Balance;Safety;Endurance;Sensory;Motor PT Transfers Functional Problem(s): Bed Mobility;Bed to Chair;Car;Furniture PT Locomotion Functional Problem(s): Ambulation;Wheelchair Mobility;Stairs PT Plan PT  Intensity: Minimum of 1-2 x/day ,45 to 90  minutes PT Frequency: 5 out of 7 days PT Duration Estimated Length of Stay: 21-25 days PT Treatment/Interventions: Training and development officer;Ambulation/gait training;Discharge planning;DME/adaptive equipment instruction;Functional mobility training;Pain management;Psychosocial support;Splinting/orthotics;Therapeutic Activities;UE/LE Strength taining/ROM;Wheelchair propulsion/positioning;UE/LE Coordination activities;Therapeutic Exercise;Stair training;Patient/family education;Neuromuscular re-education;Functional electrical stimulation;Visual/perceptual remediation/compensation PT Transfers Anticipated Outcome(s): Min A PT Locomotion Anticipated Outcome(s): Min A PT Recommendation Recommendations for Other Services: Speech consult  Skilled Therapeutic Intervention Treatment 1: Pt received in bed, with daughter Roger [name] present, and agreeable to PT. Pt denied c/o pain, but at rest BP = 169/106 mmHg, HR = 92 bpm, SpO2 = 94% & RN notified. Pt transferred to EOB with Max A from PT and max cuing for sequencing. Once sitting at EOB pt demonstrated significant push to L and poor sitting balance. PT had to provide Mod A and max cuing for pt to lean to R & support weight through RUE; pt only able to do this for seconds at a time. Sitting at EOB BP = 180/90 mmHg. PT & NT then assisted pt with sit-to-stand transfer with Steady Lift. Pt required total A to place LUE & LLE on device, but was able to participate in pulling to standing with RUE & RLE.  Once standing in lift pt required Max A for upright posture due to significant L lateral lean. PT & NT provided assistance with stand>sit in w/c; BP after task = 180/93 mmHg. Pt noted impaired sensation in LLE & very trace muscle activation in L quads.  Pt reported need to go to bathroom, therefore NT & PT provided Max A to transfer w/c>bed with steady lift in same fashion as noted above.  Pt able to roll to L in bed with S and roll R with Min A with use of bed rails for  bed pan & cleaning. PT educated pt's daughter on LLE hip, knee & ankle PROM. Pt's daughter return demonstrated task; PT instructed daughter that she could perform this 2-3 times a day for 10-15 reps. At the end of the session pt was left in bed with all needs met & daughter present to supervise; BP = 184/79 mmHg & NT notified RN of pt's elevated BP readings.  Treatment 2: Pt received in bed, daughter present, & agreeable to PT. Pt very fatigued from earlier sessions therefore supine ther-ex was completed. Supine in bed, pt's BP = 174/95 mmHg.  Pt performed RLE AAROM straight leg raises & hip abduction for 1 set of 15 each.  Pt then attempted LLE short arc quads & was able to demonstrate minimal L quad activation (1/5 MMT) but was not able to lift foot. PT encouraged pt to continue performing exercises and attempting to activate muscles in LLE as much as possible, to attempt to regain strength. Pt performed BLE hip adduction with pillow for 1 set of 12 reps, with cuing to activate LLE; trace L hip adductor strength noted during exercise. PT then performed PROM LLE hip abduction & educated pt's daughter on how to perform exercise, as pt has adductor muscle tightness; daughter able to return demonstrate.  At the end of the session pt was left in bed with all needs met & daughter present to supervise.   PT Evaluation Precautions/Restrictions Precautions Precautions: Fall Restrictions Weight Bearing Restrictions: No General Chart Reviewed: Yes Family/Caregiver Present: Yes (daughter Roger [name]) Vital SignsTherapy Vitals Pulse Rate: 92 BP: (!) 169/106 mmHg Patient Position (if appropriate): Lying Oxygen Therapy SpO2: 94 % O2 Device: Not Delivered Pulse Oximetry Type: Intermittent  Pain Pain Assessment Pain Assessment: No/denies pain Home Living/Prior Functioning Home Living Living Arrangements: Spouse/significant other Available Help at Discharge: Family Type of Home: House Home Access: Stairs to  enter Technical brewer of Steps: 2 Entrance Stairs-Rails: None Home Layout: One level  Lives With: Spouse Prior Function Level of Independence: Independent with basic ADLs;Independent with gait;Independent with transfers;Requires assistive device for independence  Able to Take Stairs?: Yes Driving: Yes (daughter reports pt has driving restrictions, not supposed to drive >04 miles) Leisure: Hobbies-yes (Comment) (tends to vegetable garden)     Cognition Overall Cognitive Status: Impaired/Different from baseline (Pt Alert & oriented to self only; daughter reports intermittent confusion prior to admission) Arousal/Alertness: Awake/alert (but sleepy) Orientation Level: Oriented to person;Disoriented to situation;Disoriented to time;Disoriented to place Sensation Sensation Light Touch: Impaired by gross assessment (LLE) Motor  Motor Motor: Hemiplegia (L side)  Mobility Bed Mobility Bed Mobility: Rolling Left;Rolling Right;Sit to Supine;Supine to Sit Rolling Right: 4: Min assist (with bed rails & bed flat) Rolling Right Details: Tactile cues for initiation;Tactile cues for placement;Verbal cues for technique Rolling Left: 5: Supervision (with bed rails & bed flat) Rolling Left Details: Verbal cues for technique Supine to Sit: 1: +1 Total assist (pt <25%) Supine to Sit Details: Tactile cues for initiation;Verbal cues for sequencing;Verbal cues for technique Sit to Supine: 1: +2 Total assist Transfers Transfers: Yes Sit to Stand: 2: Max assist;From bed Sit to Stand Details: Tactile cues for initiation;Tactile cues for sequencing;Tactile cues for posture;Tactile cues for weight shifting;Verbal cues for sequencing;Verbal cues for technique Sit to Stand Details (indicate cue type and reason):  (with steady lift) Stand to Sit: Other (comment);1: +2 Total assist (to w/c with steady lift) Stand to Sit Details (indicate cue type and reason): Tactile cues for initiation;Tactile cues for  sequencing;Tactile cues for posture;Verbal cues for technique;Verbal cues for sequencing Locomotion  Ambulation Ambulation: No Gait Gait: No Stairs / Additional Locomotion Stairs: No Wheelchair Mobility Wheelchair Mobility: No   Balance Balance Balance Assessed: Yes Static Sitting Balance Static Sitting - Balance Support: Bilateral upper extremity supported Static Sitting - Level of Assistance: 3: Mod assist Static Standing Balance Static Standing - Balance Support: Bilateral upper extremity supported Static Standing - Level of Assistance: 2: Max assist Extremity Assessment      RLE Assessment RLE Assessment: Within Functional Limits RLE AROM (degrees) Overall AROM Right Lower Extremity: Within functional limits for tasks assessed RLE Strength RLE Overall Strength: Within Functional Limits for tasks assessed (overall 4+/5) LLE Assessment LLE Assessment: Exceptions to WFL LLE AROM (degrees) Overall AROM Left Lower Extremity: Deficits;Due to decreased strength LLE PROM (degrees) Overall PROM Left Lower Extremity: Within functional limits for tasks assessed LLE Strength Left Hip Flexion: 1/5 Left Knee Flexion: 0/5 Left Knee Extension: 0/5 Left Ankle Dorsiflexion: 0/5   See Function Navigator for Current Functional Status.   Refer to Care Plan for Long Term Goals  Recommendations for other services: None  Discharge Criteria: Patient will be discharged from PT if patient refuses treatment 3 consecutive times without medical reason, if treatment goals not met, if there is a change in medical status, if patient makes no progress towards goals or if patient is discharged from hospital.  The above assessment, treatment plan, treatment alternatives and goals were discussed and mutually agreed upon: by patient and by family  [place] 10/08/2015, 12:16 PM

## 2015-10-08 NOTE — Evaluation (Signed)
Occupational Therapy Assessment and Plan  Patient Details  Name: Roger [name] MRN: [id number] Date of Birth: 07-06-1931  OT Diagnosis: cognitive deficits and flaccid hemiplegia and hemiparesis Rehab Potential: Rehab Potential (ACUTE ONLY): Fair ELOS: 21-25 days   Today's Date: 10/08/2015 OT Individual Time: 9390-3009 OT Individual Time Calculation (min): 60 min     Problem List:  Patient Active Problem List   Diagnosis Date Noted  . AKI (acute kidney injury) ([facility])   . Benign essential HTN   . Dementia   . DM type 2 with diabetic peripheral neuropathy ([facility])   . Hemiplegia affecting non-dominant side, post-stroke ([facility])   . Dysphagia as late effect of cerebrovascular disease   . Basal ganglia infarction ([facility]) 10/07/2015  . Acute left hemiparesis ([facility])   . Gait disturbance, post-stroke   . HLD (hyperlipidemia)   . Slurred speech 10/04/2015  . Malignant hypertension 10/04/2015  . Diabetes ([facility]) 10/04/2015  . Stroke ([facility]) 10/04/2015  . Left-sided weakness   . Stroke-like symptoms   . Physical deconditioning   . Diabetes mellitus with complication ([facility])   . Alzheimer's disease 08/14/2013    Past Medical History:  Past Medical History  Diagnosis Date  . Diabetes ([facility])   . Hypertension   . Gout   . ED (erectile dysfunction)   . Heart murmur   . Kidney stones   . Dysphagia    Past Surgical History:  Past Surgical History  Procedure Laterality Date  . Tonsillectomy    . Appendectomy    . Lithotripsy    . Uvulopalatopharyngoplasty      for snoring    Assessment & Plan Clinical Impression: Patient is a 82 y.o. right handed male with history of hypertension, diabetes mellitus and dementia. Patient lives with spouse independent with assistive device prior to admission. One level home with 2 steps to entry. He was driving short distances prior to admission. Presented 10/04/2015 with left-sided weakness. MRI of the brain shows moderate size acute nonhemorrhagic infarct  extending from the posterior right lenticular nuclear was extending into posterior right corona radiata. Remote small cerebellar infarcts bilaterally, right paracentral pontine infarct, basal ganglia infarct, left thalamic infarct and small infarcts Centrum semi-ovale greater on the right. CTA of the head with no acute arterial finding. Echocardiogram showed grade 1 diastolic dysfunction, no wall motion abnormalities. Systolic function was normal. Patient did not receive TPA. Neurology consulted presently on Plavix for CVA prophylaxis. Subcutaneous heparin for DVT prophylaxis. Dysphagia #1 nectar thick liquid diet. Physical and occupational therapy evaluation completed 10/05/2015 with recommendations of physical medicine rehabilitation consult.    Patient transferred to CIR on 10/07/2015.    Patient currently requires max assist with basic self-care skills secondary to muscle weakness and muscle paralysis and abnormal tone, unbalanced muscle activation and decreased motor planning.  Prior to hospitalization, patient could complete BADL independently.   Patient will benefit from skilled intervention to decrease level of assist with basic self-care skills prior to discharge home with care partner.  Anticipate patient will require minimal physical assistance and follow up home health.  OT - End of Session Activity Tolerance: Tolerates 30+ min activity with multiple rests Endurance Deficit: Yes OT Assessment Rehab Potential (ACUTE ONLY): Fair OT Patient demonstrates impairments in the following area(s): Balance;Cognition;Sensory;Safety;Motor;Endurance OT Basic ADL's Functional Problem(s): Eating;Grooming;Bathing;Dressing;Toileting OT Transfers Functional Problem(s): Toilet;Tub/Shower OT Additional Impairment(s): Fuctional Use of Upper Extremity OT Plan OT Intensity: Minimum of 1-2 x/day, 45 to 90 minutes OT Frequency: 5 out of 7 days OT Treatment/Interventions: Therapeutic  Activities;UE/LE Coordination  activities;Visual/perceptual remediation/compensation;Therapeutic Exercise;UE/LE Strength taining/ROM;Self Care/advanced ADL retraining;Patient/family education;DME/adaptive equipment instruction;Neuromuscular re-education;Functional mobility training;Discharge planning;Balance/vestibular training;Cognitive remediation/compensation OT Self Feeding Anticipated Outcome(s): Min A OT Basic Self-Care Anticipated Outcome(s): Min A OT Toileting Anticipated Outcome(s): Min A OT Bathroom Transfers Anticipated Outcome(s): Min A OT Recommendation Patient destination: Home Follow Up Recommendations: Home health OT Equipment Recommended: To be determined   Skilled Therapeutic Intervention OT 1:1 initial eval completed with daughter present during entire session.    Pt able to complete bed mobility and transfer to w/c with overall Max assist.   No clothing available.   Dense left hemiplegia with left inattention and poor sitting balance.  Pt demo's delayed response to cues but follows simple commands as he is able.   Daughter interactive with pt during session and was re-educated on methods to improve left attention and provide PROM to LUE. Pt unable to complete transfer to toilet or bench during session and returned to bed with max assist to mange legs and reposition to head of bed.   Pt immediately fell to sleep at end of session while OT discussed goals with daughter.    OT Evaluation Precautions/Restrictions  Precautions Precautions: Fall Restrictions Weight Bearing Restrictions: No  General Chart Reviewed: Yes Family/Caregiver Present: Yes (Daughter [name])  Vital Signs Therapy Vitals Temp: 98 F (36.7 C) Temp Source: Oral Pulse Rate: 87 Resp: 18 BP: (!) 150/92 mmHg Patient Position (if appropriate): Lying Oxygen Therapy SpO2: 96 % O2 Device: Not Delivered Pulse Oximetry Type: Intermittent  Pain Pain Assessment Pain Assessment: No/denies pain  Home Living/Prior Functioning Home  Living Family/patient expects to be discharged to:: Private residence Living Arrangements: Spouse/significant other Available Help at Discharge: Family Type of Home: House Home Access: Stairs to enter Secretary/administrator of Steps: 2 Entrance Stairs-Rails: None Home Layout: One level Bathroom Shower/Tub: Associate Professor: Yes  Lives With: Spouse IADL History Homemaking Responsibilities: Yes Meal Prep Responsibility: No Laundry Responsibility: No Cleaning Responsibility: Secondary Bill Paying/Finance Responsibility: No Shopping Responsibility: No Child Care Responsibility: No Current License: Yes Mode of Transportation: Car Education: [employer] Occupation: Retired Type of Occupation: multiple career Leisure and Hobbies: Church and some travel Prior Function Level of Independence: Independent with basic ADLs, Independent with gait, Independent with transfers, Requires assistive device for independence  Able to Take Stairs?: Yes Driving: Yes Vocation: Retired Leisure: Hobbies-yes (Comment) Comments: SPC use. Was driving lmited distances.  ADL ADL ADL Comments: see Functional Assessment Tool  Vision/Perception  Vision- History Baseline Vision/History: Wears glasses Patient Visual Report: No change from baseline Vision- Assessment Vision Assessment?:  (appears to have some L inattention)   Cognition Overall Cognitive Status: Impaired/Different from baseline Arousal/Alertness: Awake/alert Orientation Level: Person;Place Year: 2017 Month: March Day of Week: Correct Memory: Impaired Immediate Memory Recall: Sock;Blue;Bed Memory Recall: Sock;Blue;Bed Memory Recall Sock: With Cue Memory Recall Blue: With Cue Memory Recall Bed: With Cue Attention: Sustained Sustained Attention: Impaired Sustained Attention Impairment: Verbal complex;Functional complex Awareness: Impaired Awareness Impairment: Intellectual  impairment Problem Solving: Impaired Problem Solving Impairment: Functional basic;Verbal complex Executive Function: Organizing Organizing: Impaired Safety/Judgment: Appears intact  Sensation Sensation Light Touch: Impaired Detail Light Touch Impaired Details: Impaired LLE Stereognosis: Not tested Hot/Cold: Appears Intact Proprioception: Impaired by gross assessment;Impaired Detail Proprioception Impaired Details: Impaired LLE;Impaired LUE Coordination Gross Motor Movements are Fluid and Coordinated: No Fine Motor Movements are Fluid and Coordinated: No Coordination and Movement Description: Flaccid LUE  Motor  Motor Motor: Hemiplegia Motor - Skilled Clinical Observations: Flaccid  left hemiplegia  Mobility  Bed Mobility Bed Mobility: Sit to Supine;Supine to Sit;Rolling Right Rolling Right: 2: Max assist Rolling Right Details: Manual facilitation for placement;Verbal cues for technique;Verbal cues for safe use of DME/AE Rolling Left: 5: Supervision (with bed rails & bed flat) Rolling Left Details: Verbal cues for technique Supine to Sit: 1: +1 Total assist Supine to Sit Details: Manual facilitation for weight shifting;Manual facilitation for placement Sit to Supine: 1: +2 Total assist Sit to Supine: Patient Percentage: 30% Sit to Supine - Details: Manual facilitation for placement;Verbal cues for safe use of DME/AE;Manual facilitation for weight shifting Transfers Transfers: Sit to Stand;Stand to Sit Sit to Stand: 2: Max assist;From bed Sit to Stand Details: Manual facilitation for weight bearing;Manual facilitation for placement;Manual facilitation for weight shifting;Verbal cues for technique Sit to Stand Details (indicate cue type and reason):  (with steady lift) Stand to Sit: 2: Max assist Stand to Sit Details (indicate cue type and reason): Manual facilitation for weight bearing;Manual facilitation for placement;Manual facilitation for weight shifting;Verbal cues for  safe use of DME/AE   Trunk/Postural Assessment  Cervical Assessment Cervical Assessment: Within Functional Limits Thoracic Assessment Thoracic Assessment: Within Functional Limits Lumbar Assessment Lumbar Assessment: Within Functional Limits Postural Control Postural Control: Deficits on evaluation Righting Reactions: mod assist,sitting at EOB Protective Responses: absent response, mild pusher   Balance Balance Balance Assessed: Yes Static Sitting Balance Static Sitting - Balance Support: Bilateral upper extremity supported Static Sitting - Level of Assistance: 3: Mod assist Dynamic Sitting Balance Sitting balance - Comments: Required assist to maintain upright posture even with RUE support on bed rails.  Static Standing Balance Static Standing - Balance Support: Bilateral upper extremity supported Static Standing - Level of Assistance: 2: Max assist  Extremity/Trunk Assessment RUE Assessment RUE Assessment: Within Functional Limits LUE Assessment LUE Assessment: Exceptions to WFL LUE Tone LUE Tone: Flaccid   See Function Navigator for Current Functional Status.   Refer to Care Plan for Long Term Goals  Recommendations for other services: None  Discharge Criteria: Patient will be discharged from OT if patient refuses treatment 3 consecutive times without medical reason, if treatment goals not met, if there is a change in medical status, if patient makes no progress towards goals or if patient is discharged from hospital.  The above assessment, treatment plan, treatment alternatives and goals were discussed and mutually agreed upon: by patient and by family  [facility] 10/08/2015, 3:24 PM

## 2015-10-08 NOTE — IPOC Note (Addendum)
Overall Plan of Care [facility]) Patient Details Name: Trayvond Viets MRN: [id number] DOB: 12/22/1930  Admitting Diagnosis: R CVA  Hospital Problems: Active Problems:   Basal ganglia infarction (HCC)   Acute left hemiparesis (HCC)   Gait disturbance, post-stroke   AKI (acute kidney injury) (HCC)   Benign essential HTN   Dementia   DM type 2 with diabetic peripheral neuropathy (HCC)   Hemiplegia affecting non-dominant side, post-stroke (HCC)   Dysphagia as late effect of cerebrovascular disease   Muscle spasticity   Insomnia     Functional Problem List: Nursing Bladder, Bowel, Endurance, Medication Management, Motor, Perception, Safety, Sensory  PT Balance, Endurance, Motor, Pain, Safety, Sensory  OT Balance, Cognition, Sensory, Safety, Motor, Endurance  SLP Cognition, Nutrition  TR         Basic ADL's: OT Eating, Grooming, Bathing, Dressing, Toileting     Advanced  ADL's: OT       Transfers: PT Bed Mobility, Bed to Chair, Car, Occupational psychologist, Research scientist (life sciences): PT Psychologist, prison and probation services, Ambulation, Stairs     Additional Impairments: OT Fuctional Use of Upper Extremity  SLP Swallowing, Communication, Social Cognition expression Problem Solving, Memory, Awareness  TR      Anticipated Outcomes Item Anticipated Outcome  Self Feeding Min A  Swallowing  Supervision with least restrictive diet    Basic self-care  Min A  Toileting  Min A   Bathroom Transfers Min A  Bowel/Bladder  mod assist   Transfers  Min A  Locomotion  Mod A for ambulation, supervision for w/c mobility  Communication  Supervision for speech intelligibility at the sentence level   Cognition     Pain  n/a  Safety/Judgment  mod assist    Therapy Plan: PT Intensity: Minimum of 1-2 x/day ,45 to 90 minutes PT Frequency: 5 out of 7 days PT Duration Estimated Length of Stay: 21-25 days OT Intensity: Minimum of 1-2 x/day, 45 to 90 minutes OT Frequency: 5 out of 7 days OT  Duration/Estimated Length of Stay: 21-25 days SLP Intensity: Minumum of 1-2 x/day, 30 to 90 minutes SLP Frequency: 3 to 5 out of 7 days SLP Duration/Estimated Length of Stay: 10/28/15       Team Interventions: Nursing Interventions Patient/Family Education, Bladder Management, Bowel Management, Disease Management/Prevention, Medication Management, Cognitive Remediation/Compensation, Dysphagia/Aspiration Precaution Training, Discharge Planning  PT interventions Balance/vestibular training, Ambulation/gait training, Discharge planning, DME/adaptive equipment instruction, Functional mobility training, Pain management, Psychosocial support, Splinting/orthotics, Therapeutic Activities, UE/LE Strength taining/ROM, Wheelchair propulsion/positioning, UE/LE Coordination activities, Therapeutic Exercise, Stair training, Patient/family education, Neuromuscular re-education, Functional electrical stimulation, Visual/perceptual remediation/compensation  OT Interventions Therapeutic Activities, UE/LE Coordination activities, Visual/perceptual remediation/compensation, Therapeutic Exercise, UE/LE Strength taining/ROM, Self Care/advanced ADL retraining, Patient/family education, DME/adaptive equipment instruction, Neuromuscular re-education, Functional mobility training, Discharge planning, Balance/vestibular training, Cognitive remediation/compensation  SLP Interventions Cognitive remediation/compensation, Cueing hierarchy, Dysphagia/aspiration precaution training, Functional tasks, Patient/family education, Therapeutic Activities, Internal/external aids, Environmental controls, Speech/Language facilitation  TR Interventions    SW/CM Interventions Discharge Planning, Psychosocial Support, Patient/Family Education    Team Discharge Planning: Destination: PT-Home ,OT- Home , SLP-Home Projected Follow-up: PT-Home health PT (vs SNF if caregiver not available or able), OT-  Home health OT, SLP-Home Health SLP, 24 hour  supervision/assistance Projected Equipment Needs: PT-Wheelchair (measurements), To be determined, OT- To be determined, SLP-To be determined Equipment Details: PT- , OT-  Patient/family involved in discharge planning: PT- Patient, Family member/caregiver,  OT-Patient, Family member/caregiver, SLP-Patient, Family member/caregiver  MD ELOS: 19-22 days Medical Rehab Prognosis:  Good Assessment: 82 y.o. right handed male with history of hypertension, diabetes mellitus and dementia.  He was driving short distances prior to admission. Presented 10/04/2015 with left-sided weakness. MRI of the brain shows moderate size acute nonhemorrhagic infarct extending from the posterior right lenticular nuclear was extending into posterior right corona radiata. Remote small cerebellar infarcts bilaterally, right paracentral pontine infarct, basal ganglia infarct, left thalamic infarct and small infarcts Centrum semi-ovale greater on the right. Patient did not receive TPA. Neurology consulted presently on Plavix for CVA prophylaxis. Dysphagia #1 nectar thick liquid diet.   See Team Conference Notes for weekly updates to the plan of care

## 2015-10-08 NOTE — Plan of Care (Signed)
Problem: RH Bed Mobility Goal: LTG Patient will perform bed mobility with assist (PT) LTG: Patient will perform bed mobility with assistance, with/without cues (PT). With use of bed features if necessary  Problem: RH Bed to Chair Transfers Goal: LTG Patient will perform bed/chair transfers w/assist (PT) LTG: Patient will perform bed/chair transfers with assistance, with/without cues (PT). With LRAD  Problem: RH Ambulation Goal: LTG Patient will ambulate in controlled environment (PT) LTG: Patient will ambulate in a controlled environment, # of feet with assistance (PT). With LRAD  Problem: RH Wheelchair Mobility Goal: LTG Patient will propel w/c in controlled environment (PT) LTG: Patient will propel wheelchair in controlled environment, # of feet with assist (PT) With LRAD Goal: LTG Patient will propel w/c in home environment (PT) LTG: Patient will propel wheelchair in home environment, # of feet with assistance (PT). With LRAD Goal: LTG Patient will propel w/c in community environment (PT) LTG: Patient will propel wheelchair in community environment, # of feet with assist (PT) With LRAD

## 2015-10-08 NOTE — Evaluation (Signed)
Speech Language Pathology Assessment and Plan  Patient Details  Name: Roger [name] MRN: [id number] Date of Birth: Feb 27, 1931  SLP Diagnosis: Cognitive Impairments;Speech and Language deficits;Dysphagia  Rehab Potential: Excellent ELOS: 21-25 days     Today's Date: 10/08/2015 SLP Individual Time: 0900-1000 SLP Individual Time Calculation (min): 60 min   Problem List:  Patient Active Problem List   Diagnosis Date Noted  . AKI (acute kidney injury) ([place])   . Benign essential HTN   . Dementia   . DM type 2 with diabetic peripheral neuropathy ([place])   . Hemiplegia affecting non-dominant side, post-stroke ([place])   . Dysphagia as late effect of cerebrovascular disease   . Basal ganglia infarction ([place]) 10/07/2015  . Acute left hemiparesis ([place])   . Gait disturbance, post-stroke   . HLD (hyperlipidemia)   . Slurred speech 10/04/2015  . Malignant hypertension 10/04/2015  . Diabetes ([place]) 10/04/2015  . Stroke ([place]) 10/04/2015  . Left-sided weakness   . Stroke-like symptoms   . Physical deconditioning   . Diabetes mellitus with complication ([place])   . Alzheimer's disease 08/14/2013   Past Medical History:  Past Medical History  Diagnosis Date  . Diabetes ([place])   . Hypertension   . Gout   . ED (erectile dysfunction)   . Heart murmur   . Kidney stones   . Dysphagia    Past Surgical History:  Past Surgical History  Procedure Laterality Date  . Tonsillectomy    . Appendectomy    . Lithotripsy    . Uvulopalatopharyngoplasty      for snoring    Assessment / Plan / Recommendation Clinical Impression Patient is an 82 y.o. right handed male with history of hypertension, diabetes mellitus and dementia. Patient lives with spouse and was independent with assistive device prior to admission. Presented 10/04/2015 with left-sided weakness. MRI of the brain shows moderate size acute nonhemorrhagic infarct extending from the posterior right lenticular nuclear was extending into posterior  right corona radiata. Remote small cerebellar infarcts bilaterally, right paracentral pontine infarct, basal ganglia infarct, left thalamic infarct and small infarcts Centrum semi-ovale greater on the right. Physical and occupational therapy evaluation completed 10/05/2015 with recommendations of physical medicine rehabilitation consult. Patient was admitted for comprehensive rehabilitation program on 10/07/15. Patient administered a cognitive-linguistic evaluation and demonstrates impaired sustained attention, attention to left field of environment, recall, functional problem solving, intellectual awareness of deficits and overall safety awareness. Patient also demonstrates moderate dysarthria due to a low vocal intensity and oral weakness resulting in imprecise consonants and decreased intelligibility at the word and phrase level. Patient was also administered a BSE. Patient's swallow initiation appeared timely at first with trials of ice chips and thin liquids via tsp, however, as the patient became more fatigued, he demonstrated oral holding of nectar-thick liquids via tsp and Dys. 1 textures with intermittent delayed coughing, therefore, trials were limited due to fatigue. Throughout trials, patient required overall Mod-Max A verbal cues for use of swallowing compensatory strategies. Recommend patient continue current diet of Dys. 1 textures with nectar-thick liquids via tsp with full supervision. Patient would benefit from skilled SLP intervention to maximize his cognitive and swallowing function and speech intelligibility in order to maximize his overall functional independence prior to discharge home.   Skilled Therapeutic Interventions          Administered a cognitive-linguistic evaluation and BSE. Please see above for details.   SLP Assessment  Patient will need skilled Speech Lanaguage Pathology Services during CIR admission  Recommendations  SLP Diet Recommendations: Dysphagia 1  (Puree);Nectar Liquid Administration via: Spoon Medication Administration: Crushed with puree Supervision: Patient able to self feed;Full supervision/cueing for compensatory strategies Compensations: Minimize environmental distractions;Slow rate;Small sips/bites;Multiple dry swallows after each bite/sip;Follow solids with liquid;Clear throat intermittently Postural Changes and/or Swallow Maneuvers: Seated upright 90 degrees;Upright 30-60 min after meal Oral Care Recommendations: Oral care QID;Oral care prior to ice chip/H20 Patient destination: Home Follow up Recommendations: Home Health SLP;24 hour supervision/assistance Equipment Recommended: To be determined    SLP Frequency 3 to 5 out of 7 days   SLP Duration  SLP Intensity  SLP Treatment/Interventions 21-25 days   Minumum of 1-2 x/day, 30 to 90 minutes  Cognitive remediation/compensation;Cueing hierarchy;Dysphagia/aspiration precaution training;Functional tasks;Patient/family education;Therapeutic Activities;Internal/external aids;Environmental controls;Speech/Language facilitation    Pain    Prior Functioning Type of Home: House  Lives With: Spouse Available Help at Discharge: Family Education: BS [employer] Vocation: Retired  Function:  Eating Eating   Modified Consistency Diet: Yes Eating Assist Level: Set up assist for;Supervision or verbal cues   Eating Set Up Assist For: Opening containers       Cognition Comprehension Comprehension assist level: Understands basic 75 - 89% of the time/ requires cueing 10 - 24% of the time  Expression   Expression assist level: Expresses basic 50 - 74% of the time/requires cueing 25 - 49% of the time. Needs to repeat parts of sentences.  Social Interaction Social Interaction assist level: Interacts appropriately 75 - 89% of the time - Needs redirection for appropriate language or to initiate interaction.  Problem Solving Problem solving assist level: Solves basic less  than 25% of the time - needs direction nearly all the time or does not effectively solve problems and may need a restraint for safety  Memory Memory assist level: Recognizes or recalls 25 - 49% of the time/requires cueing 50 - 75% of the time   Short Term Goals: Week 1: SLP Short Term Goal 1 (Week 1): Patient will consume current diet with minimal overt s/s of aspiration with Mod A verbal cues for use of swallowing compensatory strategies.  SLP Short Term Goal 2 (Week 1): Patient will consume trials of ice chips and thin liquids via tsp with minimal overt s/s of aspiration in less than 30% of trials to assess readniess for repeat MBS. SLP Short Term Goal 3 (Week 1): Patient will demonstrate efficient mastication of Dys. 2 textures over 3 consecutive sessions with minimal oral residue and no overt s/s of aspiration with Mod A verbal cues prior to upgrade.  SLP Short Term Goal 4 (Week 1): Patient will utilize an increased vocal intensity and over-articulation at the word and phrase level with Mod A verbal cues to maximize intelligibility to 75%.  SLP Short Term Goal 5 (Week 1): Patient will attend to left field of enviornment during functional tasks with Min A verbal cues.  SLP Short Term Goal 6 (Week 1): Patient will demonstrate sustained attention to functional tasks for 10 minutes with Min A verbal cues for redirection.   Refer to Care Plan for Long Term Goals  Recommendations for other services: None  Discharge Criteria: Patient will be discharged from SLP if patient refuses treatment 3 consecutive times without medical reason, if treatment goals not met, if there is a change in medical status, if patient makes no progress towards goals or if patient is discharged from hospital.  The above assessment, treatment plan, treatment alternatives and goals were discussed and mutually agreed upon: by patient  and by family  Demarie Uhlig 10/08/2015, 5:18 PM

## 2015-10-08 NOTE — Progress Notes (Addendum)
[facility] PHYSICAL MEDICINE & REHABILITATION     PROGRESS NOTE  Subjective/Complaints:  Pt seen sitting up in bed working with OT.  He states his night was fair, but did not have any issues.   ROS: Denies CP, SOB, n/v/d.   Objective: Vital Signs: Blood pressure 171/81, pulse 87, temperature 98.2 F (36.8 C), temperature source Oral, resp. rate 18, height 5\' 9"  (1.753 m), weight 64.2 kg (141 lb 8.6 oz), SpO2 97 %. No results found.  Recent Labs  10/07/15 1530  WBC 7.6  HGB 12.0*  HCT 38.0*  PLT 173    Recent Labs  10/07/15 1530  NA 145  K 4.3  CL 107  GLUCOSE 334*  BUN 29*  CREATININE 1.47*  CALCIUM 9.6   CBG (last 3)   Recent Labs  10/07/15 1825 10/07/15 2043 10/08/15 0639  GLUCAP 272* 250* 236*    Wt Readings from Last 3 Encounters:  10/07/15 64.2 kg (141 lb 8.6 oz)  10/07/15 66.2 kg (145 lb 15.1 oz)  02/20/14 63.957 kg (141 lb)    Physical Exam:  BP 171/81 mmHg  Pulse 87  Temp(Src) 98.2 F (36.8 C) (Oral)  Resp 18  Ht 5\' 9"  (1.753 m)  Wt 64.2 kg (141 lb 8.6 oz)  BMI 20.89 kg/m2  SpO2 97% HENT: Normocephalic, Atraumatic.  Eyes: EOM and Conj are normal.  Cardiovascular: Normal rate and regular rhythm. Grade 3 systolic ejection murmur Respiratory: Effort normal and breath sounds normal. No respiratory distress.  GI: Soft. Bowel sounds are normal. He exhibits no distension.  Neurological:  Patient is alert and oriented x2 with increased time and cues.   Moderate dysarthria.  Poor historian.  He will follow some simple verbal commands.  Left facial droop  Motor: LUE/LLE: 0/5 RUE: 5/5 proximal to distal LLE: 4+/5 proximal to distal Skin: Skin is warm and dry. Grade 1 sacral decubitus, hips and heels without evidence of breakdown   Assessment/Plan: 1. 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 Physiatrist is providing close team supervision and 24 hour management of active medical problems listed below. Physiatrist and rehab team continue to assess barriers to discharge/monitor patient progress toward functional and medical goals.  Function:  Bathing Bathing position      Bathing parts      Bathing assist        Upper Body Dressing/Undressing Upper body dressing                    Upper body assist        Lower Body Dressing/Undressing Lower body dressing                                  Lower body assist        Toileting Toileting          Toileting assist     Transfers Chair/bed transfer             Locomotion Ambulation           Wheelchair          Cognition Comprehension Comprehension assist level: Understands basic 90% of the time/cues < 10% of the time  Expression Expression assist level: Expresses basic 75 - 89% of the time/requires cueing 10 - 24% of the time. Needs helper to  occlude trach/needs to repeat words.  Social Interaction Social Interaction assist level: Interacts appropriately 75 - 89% of the time - Needs redirection for appropriate language or to initiate interaction.  Problem Solving Problem solving assist level: Solves basic 50 - 74% of the time/requires cueing 25 - 49% of the time  Memory Memory assist level: Recognizes or recalls 50 - 74% of the time/requires cueing 25 - 49% of the time     Medical Problem List and Plan: 1. Left side weakness, dysphagia/dysarthria secondary to right lenticular nucleus and corona radiata infarcts on 2/27  Begin CIR 2. DVT Prophylaxis/Anticoagulation: Subcutaneous heparin. Monitor platelet count and any signs of bleeding 3. Pain Management: Tylenol as needed 4. Mood/dementia: Discuss cognitive baseline with family. Resumed [place] 5. Neuropsych: This patient is capable of making decisions on his own behalf. 6. Skin/Wound Care: Routine skin checks 7.  Fluids/Electrolytes/Nutrition: Routine I&O's   Dysphagia 1, Nectar thick  Will order labs for Monday 8. Hypertension.   No current antihypertensive medication.   Patient on Cozaar 100 mg daily prior to admission.   Will allow for permissive HTN for time being and consider reitiniation of medications if BPs cont to be elevated 9. Diabetes mellitus of peripheral neuropathy. Hemoglobin A1c 8.1. Check blood sugars before meals and at bedtime.   Patient on Glucophage ex RR 500 mg twice daily prior to admission   Will cont to monitor with increase activity and consider reinitation after lab work 10. Hyperlipidemia. Lipitor 11. BPH. Check PVRs 3. Resumed Proscar 5 mg daily 12. History of gout.Colchicine. Monitor for any gout flareup 13. AKI  Cr. 1.47 on 3/2  Will cont to monitor  LOS (Days) 1 A FACE TO FACE EVALUATION WAS PERFORMED  Ankit Karis Juba 10/08/2015 8:46 AM

## 2015-10-09 ENCOUNTER — Inpatient Hospital Stay (HOSPITAL_COMMUNITY): Payer: Medicare Other | Admitting: Speech Pathology

## 2015-10-09 ENCOUNTER — Inpatient Hospital Stay (HOSPITAL_COMMUNITY): Payer: Medicare Other | Admitting: Physical Therapy

## 2015-10-09 ENCOUNTER — Inpatient Hospital Stay (HOSPITAL_COMMUNITY): Payer: Medicare Other

## 2015-10-09 LAB — GLUCOSE, CAPILLARY
GLUCOSE-CAPILLARY: 272 mg/dL — AB (ref 65–99)
GLUCOSE-CAPILLARY: 273 mg/dL — AB (ref 65–99)
Glucose-Capillary: 260 mg/dL — ABNORMAL HIGH (ref 65–99)
Glucose-Capillary: 250 mg/dL — ABNORMAL HIGH (ref 65–99)

## 2015-10-09 MED ORDER — GLIMEPIRIDE 1 MG PO TABS
1.0000 mg | ORAL_TABLET | Freq: Every day | ORAL | Status: DC
  Administered 2015-10-09: 1 mg via ORAL
  Filled 2015-10-09: qty 1

## 2015-10-09 MED ORDER — STARCH (THICKENING) PO POWD
ORAL | Status: DC | PRN
  Filled 2015-10-09: qty 227

## 2015-10-09 NOTE — Progress Notes (Signed)
Pt. aware to call if needing help with home CPAP.

## 2015-10-09 NOTE — Progress Notes (Signed)
Speech Language Pathology Daily Session Note  Patient Details  Name: Roger [name] MRN: [id number] Date of Birth: 1931-01-01  Today's Date: 10/09/2015 SLP Individual Time: 1500-1535 SLP Individual Time Calculation (min): 35 min  Short Term Goals: Week 1: SLP Short Term Goal 1 (Week 1): Patient will consume current diet with minimal overt s/s of aspiration with Mod A verbal cues for use of swallowing compensatory strategies.  SLP Short Term Goal 2 (Week 1): Patient will consume trials of ice chips and thin liquids via tsp with minimal overt s/s of aspiration in less than 30% of trials to assess readniess for repeat MBS. SLP Short Term Goal 3 (Week 1): Patient will demonstrate efficient mastication of Dys. 2 textures over 3 consecutive sessions with minimal oral residue and no overt s/s of aspiration with Mod A verbal cues prior to upgrade.  SLP Short Term Goal 4 (Week 1): Patient will utilize an increased vocal intensity and over-articulation at the word and phrase level with Mod A verbal cues to maximize intelligibility to 75%.  SLP Short Term Goal 5 (Week 1): Patient will attend to left field of enviornment during functional tasks with Min A verbal cues.  SLP Short Term Goal 6 (Week 1): Patient will demonstrate sustained attention to functional tasks for 10 minutes with Min A verbal cues for redirection.   Skilled Therapeutic Interventions: Skilled treatment session focused on cognitive and speech goals. Upon arrival, patient was asleep while supine in bed but was easily awakened. However, patient remained lethargic throughout the session and required Mod-Max A verbal cues redirection to therapeutic tasks. Patient was essentially aphonic at beginning of session, however, with thorough oral care via the suction toothbrush and several sips of nectar-thick liquids via tsp, patient was able to vocalize at the word and phrase level with ~50% intelligibility and Max A verbal cues for use of an increased  vocal intensity. Suspect patient's speech was impacted by fatigue. SLP facilitated session by providing extra time and Mod-Max A question and visual cues for functional problem solving in regards to counting money during a basic money management task. Patient was left upright in bed with all needs within reach and daughter present. Continue with current plan of care.    Function:  Cognition Comprehension Comprehension assist level: Understands basic 75 - 89% of the time/ requires cueing 10 - 24% of the time  Expression   Expression assist level: Expresses basic 50 - 74% of the time/requires cueing 25 - 49% of the time. Needs to repeat parts of sentences.  Social Interaction Social Interaction assist level: Interacts appropriately 75 - 89% of the time - Needs redirection for appropriate language or to initiate interaction.  Problem Solving Problem solving assist level: Solves basic 25 - 49% of the time - needs direction more than half the time to initiate, plan or complete simple activities  Memory Memory assist level: Recognizes or recalls 25 - 49% of the time/requires cueing 50 - 75% of the time    Pain No/Denies Pain   Therapy/Group: Individual Therapy  Tkeyah Burkman 10/09/2015, 3:43 PM

## 2015-10-09 NOTE — Progress Notes (Signed)
Physical Therapy Session Note  Patient Details  Name: Roger [name] MRN: [id number] Date of Birth: 1931/05/15  Today's Date: 10/09/2015 PT Individual Time: 1050-1205 PT Individual Time Calculation (min): 75 min   Short Term Goals: Week 1:  PT Short Term Goal 1 (Week 1): Pt will be able to transfer supine<>sit with Min A with use of bed features. PT Short Term Goal 2 (Week 1): Pt will be able to transfer sit<>stand with Min A. PT Short Term Goal 3 (Week 1): Pt will be able to perform stand pivot tranfsfers with Mod A. PT Short Term Goal 4 (Week 1): Pt will be able to tolerate sitting at EOB with Min A and able to correct LOB & posture.  Skilled Therapeutic Interventions/Progress Updates:  Pt was seen bedside in the am. Pt rolled R/L with side rails and mod A with increased time. Pt transferred supine to edge of bed with total A 1. Pt tolerated edge of bed 5 minutes with min to max A and verbal cues with R UE support. Pt transferred edge of bed to w/c with total A squat pivot transfer. Pt propelled w/c with R UE and LE about 20 feet with max A anss verbal cues. Pt transported to rehab gym. In gym pt stood in parallel bars x 3 with max A, while standing worked on upright posture, midline and weight shifting. Pt returned to room. Pt transferred w/c to edge of bed with max to total A. Pt transferred edge of bed to supine with total A. Pt rolled R/L with mod A and verbal cues with bed rails. Pt performed LE exercises, 2 sets x 10 reps each, AAROM R LE and PROM L LE. Pt transferred supine to edge of bed with max to total A. Pt tolerated edge of bed about 5 minutes with min guard to mod A and verbal cues with R UE support. Pt transferred edge of bed to w/c with max to total A and verbal cues. Pt repositioned in w/c. Pt left sitting up in w/c with quick release belt in place and call bell within reach.  Therapy Documentation Precautions:  Precautions Precautions: Fall Restrictions Weight Bearing  Restrictions: No General:   Pain: No c/o pain.   See Function Navigator for Current Functional Status.   Therapy/Group: Individual Therapy  Rayford Halsted 10/09/2015, 12:46 PM

## 2015-10-09 NOTE — Progress Notes (Signed)
[place] PHYSICAL MEDICINE & REHABILITATION     PROGRESS NOTE  Subjective/Complaints:  Pt seen sitting up in bed working with OT.  He states his night was fair, but did not have any issues.   ROS: Denies CP, SOB, n/v/d.   Objective: Vital Signs: Blood pressure 144/91, pulse 94, temperature 97.8 F (36.6 C), temperature source Oral, resp. rate 18, height 5\' 9"  (1.753 m), weight 64.2 kg (141 lb 8.6 oz), SpO2 98 %. No results found.  Recent Labs  10/07/15 1530  WBC 7.6  HGB 12.0*  HCT 38.0*  PLT 173    Recent Labs  10/07/15 1530  NA 145  K 4.3  CL 107  GLUCOSE 334*  BUN 29*  CREATININE 1.47*  CALCIUM 9.6   CBG (last 3)   Recent Labs  10/08/15 1705 10/08/15 2033 10/09/15 0638  GLUCAP 251* 267* 272*    Wt Readings from Last 3 Encounters:  10/07/15 64.2 kg (141 lb 8.6 oz)  10/07/15 66.2 kg (145 lb 15.1 oz)  02/20/14 63.957 kg (141 lb)    Physical Exam:  BP 144/91 mmHg  Pulse 94  Temp(Src) 97.8 F (36.6 C) (Oral)  Resp 18  Ht 5\' 9"  (1.753 m)  Wt 64.2 kg (141 lb 8.6 oz)  BMI 20.89 kg/m2  SpO2 98% HENT: Normocephalic, Atraumatic.  Eyes: EOM and Conj are normal.  Cardiovascular: Normal rate and regular rhythm. Grade 3 systolic ejection murmur Respiratory: Effort normal and breath sounds normal. No respiratory distress.  GI: Soft. Bowel sounds are normal. He exhibits no distension.  Neurological:  Patient is alert and oriented x2 with increased time and cues.   Moderate dysarthria.  Poor historian.  He will follow some simple verbal commands.  Left facial droop  Motor: LUE/LLE: 0/5 RUE: 5/5 proximal to distal LLE: 4+/5 proximal to distal Skin: Skin is warm and dry. Grade 1 sacral decubitus, hips and heels without evidence of breakdown   Assessment/Plan: 1. 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 Physiatrist is providing close team supervision and 24 hour management of active medical problems listed below. Physiatrist and rehab team continue to assess barriers to discharge/monitor patient progress toward functional and medical goals.  Function:  Bathing Bathing position      Bathing parts      Bathing assist        Upper Body Dressing/Undressing Upper body dressing                    Upper body assist        Lower Body Dressing/Undressing Lower body dressing                                  Lower body assist        Toileting Toileting          Toileting assist     Transfers Chair/bed transfer   Chair/bed transfer method: Other (steady lift) Chair/bed transfer assist level: dependent (Pt equals 0%) Chair/bed transfer assistive device:  (steady lift)     Locomotion Ambulation Ambulation activity did not occur: Safety/medical concerns         Wheelchair Wheelchair activity did not occur: Safety/medical concerns        Cognition Comprehension Comprehension assist level: Understands basic 75 - 89% of the time/ requires  cueing 10 - 24% of the time  Expression Expression assist level: Expresses basic 50 - 74% of the time/requires cueing 25 - 49% of the time. Needs to repeat parts of sentences.  Social Interaction Social Interaction assist level: Interacts appropriately 75 - 89% of the time - Needs redirection for appropriate language or to initiate interaction.  Problem Solving Problem solving assist level: Solves basic 25 - 49% of the time - needs direction more than half the time to initiate, plan or complete simple activities  Memory Memory assist level: Recognizes or recalls 25 - 49% of the time/requires cueing 50 - 75% of the time     Medical Problem List and Plan: 1. 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 DVT Prophylaxis/Anticoagulation: Subcutaneous heparin. Monitor platelet count and any signs of bleeding 3. Pain Management: Tylenol as needed 4. Mood/dementia: Discuss cognitive baseline with family. Resumed Namenda 5. Neuropsych: This patient is capable of making decisions on his own behalf. 6. Skin/Wound Care: Routine skin checks 7. Fluids/Electrolytes/Nutrition: Routine I&O's   Dysphagia 1, Nectar thick- 100% meal intake!  Will order labs for Monday 8. Hypertension.   No current antihypertensive medication.   Patient on Cozaar 100 mg daily prior to admission.   Will allow for permissive HTN for time being and consider reitiniation of medications if BPs cont to be elevated 9. Diabetes mellitus of peripheral neuropathy. Hemoglobin A1c 8.1. Check blood sugars before meals and at bedtime.   Patient on Glucophage ex RR 500 mg twice daily prior to admission   CBG>250 start amaryl until creat is down then may restart glucophage 10. Hyperlipidemia. Lipitor 11. BPH. Check PVRs 3. Resumed Proscar 5 mg daily 12. History of gout.Colchicine. Monitor for any gout flareup 13. AKI  Cr. 1.47 on 3/2  Will cont to monitor  LOS (Days) 2 A FACE TO FACE EVALUATION WAS PERFORMED  Erick Colace 10/09/2015 6:57 AM

## 2015-10-09 NOTE — Progress Notes (Signed)
Physical Therapy Session Note  Patient Details  Name: Roger [name] MRN: [id number] Date of Birth: Apr 15, 1931  Today's Date: 10/09/2015 PT Individual Time: 1300-1330 PT Individual Time Calculation (min): 30 min   Short Term Goals: Week 1:  PT Short Term Goal 1 (Week 1): Pt will be able to transfer supine<>sit with Min A with use of bed features. PT Short Term Goal 2 (Week 1): Pt will be able to transfer sit<>stand with Min A. PT Short Term Goal 3 (Week 1): Pt will be able to perform stand pivot tranfsfers with Mod A. PT Short Term Goal 4 (Week 1): Pt will be able to tolerate sitting at EOB with Min A and able to correct LOB & posture.  Skilled Therapeutic Interventions/Progress Updates:  Pt was seen bedside in the pm, eating lunch with daughter. Pt requires verbal cues and S for eating. Pt fatigued and ready to return to bed. Pt transferred w/c to edge of bed with total A and verbal cues. Pt transferred edge of bed to supine with total A and verbal cues. Pt left sitting up in bed with call bell within reach and bed alarm on.   Therapy Documentation Precautions:  Precautions Precautions: Fall Restrictions Weight Bearing Restrictions: No General:   Pain:  See Function Navigator for Current Functional Status.   Therapy/Group: Individual Therapy  Rayford Halsted 10/09/2015, 1:57 PM

## 2015-10-09 NOTE — Progress Notes (Signed)
Occupational Therapy Session Note  Patient Details  Name: Roger [name] MRN: [id number] Date of Birth: [birth year]  Today's Date: 10/09/2015 OT Individual Time: 1350-1430 OT Individual Time Calculation (min): 40 min   Short Term Goals: Week 1:  OT Short Term Goal 1 (Week 1): Pt will complete upper body dressing sitting at EOB with mod assist OT Short Term Goal 2 (Week 1): Pt will complete transfer to [facility] with mod assist OT Short Term Goal 3 (Week 1): Pt will maintian supported static standing balance during assisted lower body dressing at sink independently OT Short Term Goal 4 (Week 1): Pt will bathe 4 of 10 body parts with supervision after setup at sink OT Short Term Goal 5 (Week 1): Pt will complete 2 of 5 grooming task with min vc for thoroughness  Skilled Therapeutic Interventions/Progress Updates: Therapeutic activity with focus on improved attention and alertness, transfers, sitting balance, and NMR of LUE.   Pt received after RN treatment for bowel care d/t constipation.   Pt responds to therapist appropriately and performs bed mobility with min instructional cues and manual facilitation to place right foot under left ankle.   Pt unable to rise from side lying to sitting although demonstrating good effort and attention.  Pt is re-educated on static sitting balance and rigthing reaction with right hand on bed rail, sustaining static balance for 15 seconds before drifting to left lateral and forward lean.   Pt completes transfer to w/c with max assist and is escorted to outdoor area to promote improved alertness.   With hand-over-hand guidance pt performs SROM of LUE for 10 min and is escorted back to his room with max assist to transfer and return to bed.      Therapy Documentation Precautions:  Precautions Precautions: Fall Restrictions Weight Bearing Restrictions: No   General: General OT Amount of Missed Time: 20 Minutes  Vital Signs: Therapy Vitals Temp: 97.6 F (36.4  C) Temp Source: Oral Pulse Rate: 97 Resp: 18 BP: (!) 145/78 mmHg Patient Position (if appropriate): Lying Oxygen Therapy SpO2: 96 % O2 Device: Not Delivered   Pain: No/denies pain     ADL: ADL ADL Comments: see Functional Assessment Tool   See Function Navigator for Current Functional Status.   Therapy/Group: Individual Therapy  [name] 10/09/2015, 3:55 PM

## 2015-10-10 ENCOUNTER — Inpatient Hospital Stay (HOSPITAL_COMMUNITY): Payer: Medicare Other | Admitting: Occupational Therapy

## 2015-10-10 LAB — GLUCOSE, CAPILLARY
GLUCOSE-CAPILLARY: 173 mg/dL — AB (ref 65–99)
GLUCOSE-CAPILLARY: 132 mg/dL — AB (ref 65–99)
Glucose-Capillary: 188 mg/dL — ABNORMAL HIGH (ref 65–99)
Glucose-Capillary: 269 mg/dL — ABNORMAL HIGH (ref 65–99)

## 2015-10-10 MED ORDER — TIZANIDINE HCL 2 MG PO TABS
2.0000 mg | ORAL_TABLET | Freq: Three times a day (TID) | ORAL | Status: DC
  Administered 2015-10-10 – 2015-10-15 (×16): 2 mg via ORAL
  Filled 2015-10-10 (×16): qty 1

## 2015-10-10 MED ORDER — LOSARTAN POTASSIUM 50 MG PO TABS
50.0000 mg | ORAL_TABLET | Freq: Every day | ORAL | Status: DC
  Administered 2015-10-10 – 2015-10-12 (×3): 50 mg via ORAL
  Filled 2015-10-10 (×4): qty 1

## 2015-10-10 MED ORDER — GLIMEPIRIDE 2 MG PO TABS
2.0000 mg | ORAL_TABLET | Freq: Every day | ORAL | Status: DC
  Administered 2015-10-10 – 2015-10-23 (×14): 2 mg via ORAL
  Filled 2015-10-10 (×15): qty 1

## 2015-10-10 NOTE — Progress Notes (Signed)
Occupational Therapy Session Note  Patient Details  Name: Roger [name] MRN: [id number] Date of Birth: 28-Nov-1930  Today's Date: 10/10/2015 OT Individual Time: 3662-9476 OT Individual Time Calculation (min): 30 min    Short Term Goals: Week 1:  OT Short Term Goal 1 (Week 1): Pt will complete upper body dressing sitting at EOB with mod assist OT Short Term Goal 2 (Week 1): Pt will complete transfer to [facility] with mod assist OT Short Term Goal 3 (Week 1): Pt will maintian supported static standing balance during assisted lower body dressing at sink independently OT Short Term Goal 4 (Week 1): Pt will bathe 4 of 10 body parts with supervision after setup at sink OT Short Term Goal 5 (Week 1): Pt will complete 2 of 5 grooming task with min vc for thoroughness  Skilled Therapeutic Interventions/Progress Updates: Patient eating lunch under supportive daughter's supervision upon arrival.   This clinician further supervised the session and helped dtr use thickener Coke to correct thickness - dtr was astute with supervising her dad.   Patient was able to complete hand to mouth self feeding without spillage; however, he did require reminders to swall the correct number of times between bites and sips.        As well, this clinician educated daughter on further increasing Dad sitting upright in bed while eating by placing pillow behind his back for low back support as well as keeping an eye to help Dad sit midline versus falling to his right slightly   Therapy Documentation Precautions:  Precautions Precautions: Fall Restrictions Weight Bearing Restrictions: No Pain:denied   ADL: ADL ADL Comments: see Functional Assessment Tool See Function Navigator for Current Functional Status.   Therapy/Group: Individual Therapy  Bud [facility] 10/10/2015, 4:31 PM

## 2015-10-10 NOTE — Progress Notes (Signed)
PHYSICAL MEDICINE & REHABILITATION     PROGRESS NOTE  Subjective/Complaints:  Awake , getting vitals taken,no c/os Left arm with elbow flexion at rest  ROS: Denies CP, SOB, n/v/d.   Objective: Vital Signs: Blood pressure 145/78, pulse 97, temperature 97.6 F (36.4 C), temperature source Oral, resp. rate 18, height  (1.753 m), weight 64.2 kg (141 lb 8.6 oz), SpO2 96 %. No results found.  Recent Labs  10/07/15 1530  WBC 7.6  HGB 12.0*  HCT 38.0*  PLT 173    Recent Labs  10/07/15 1530  NA 145  K 4.3  CL 107  GLUCOSE 334*  BUN 29*  CREATININE 1.47*  CALCIUM 9.6   CBG (last 3)   Recent Labs  10/09/15 1140 10/09/15 1629 10/09/15 2035  GLUCAP 260* 273* 250*    Wt Readings from Last 3 Encounters:  10/07/15 64.2 kg (141 lb 8.6 oz)  10/07/15 66.2 kg (145 lb 15.1 oz)  02/20/14 63.957 kg (141 lb)    Physical Exam:  BP 145/78 mmHg  Pulse 97  Temp(Src) 97.6 F (36.4 C) (Oral)  Resp 18  Ht  (1.753 m)  Wt 64.2 kg (141 lb 8.6 oz)  BMI 20.89 kg/m2  SpO2 96% HENT: Normocephalic, Atraumatic.  Eyes: EOM and Conj are normal.  Cardiovascular: Normal rate and regular rhythm. Grade 3 systolic ejection murmur Respiratory: Effort normal and breath sounds normal. No respiratory distress.  GI: Soft. Bowel sounds are normal. He exhibits no distension.  Neurological:  Patient is alert and oriented x2 with increased time and cues.   Moderate dysarthria.  Poor historian.  He will follow some simple verbal commands.  Left facial droop  Motor: LUE/LLE: 0/5 RUE: 5/5 proximal to distal LLE: 4+/5 proximal to distal MAS 3 elbow flexor, MAS 2 wrist flexor   Skin: Skin is warm and dry. Grade 1 sacral decubitus, hips and heels without evidence of breakdown   Assessment/Plan: 1. 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 Physiatrist is providing close team supervision and 24 hour management of active medical problems listed below. Physiatrist and rehab team continue to assess barriers to discharge/monitor patient progress toward functional and medical goals.  Function:  Bathing Bathing position      Bathing parts      Bathing assist        Upper Body Dressing/Undressing Upper body dressing                    Upper body assist        Lower Body Dressing/Undressing Lower body dressing                                  Lower body assist        Toileting Toileting          Toileting assist     Transfers Chair/bed transfer   Chair/bed transfer method: Squat pivot Chair/bed transfer assist level: Total assist (Pt < 25%) Chair/bed transfer assistive device:  (steady lift)     Locomotion Ambulation Ambulation activity did not occur: Safety/medical concerns         Wheelchair Wheelchair activity did not occur: Safety/medical concerns Type: Manual Max wheelchair distance: 20 Assist Level: Maximal assistance (Pt 25 - 49%)  Cognition Comprehension Comprehension assist level: Understands  basic 75 - 89% of the time/ requires cueing 10 - 24% of the time  Expression Expression assist level: Expresses basic 50 - 74% of the time/requires cueing 25 - 49% of the time. Needs to repeat parts of sentences.  Social Interaction Social Interaction assist level: Interacts appropriately 75 - 89% of the time - Needs redirection for appropriate language or to initiate interaction.  Problem Solving Problem solving assist level: Solves basic 25 - 49% of the time - needs direction more than half the time to initiate, plan or complete simple activities  Memory Memory assist level: Recognizes or recalls 25 - 49% of the time/requires cueing 50 - 75% of the time     Medical Problem List and Plan: 1. Left side weakness, dysphagia/dysarthria secondary to right lenticular  nucleus and corona radiata infarcts on 2/27  Increased tone L Biceps will cont PT, OT, start  zanaflex 2mg  TID 2. DVT Prophylaxis/Anticoagulation: Subcutaneous heparin. Monitor platelet count and any signs of bleeding 3. Pain Management: Tylenol as needed 4. Mood/dementia: Discuss cognitive baseline with family. Resumed Namenda 5. Neuropsych: This patient is capable of making decisions on his own behalf. 6. Skin/Wound Care: Routine skin checks 7. Fluids/Electrolytes/Nutrition: Routine I&O's   Dysphagia 1, Nectar thick- 100% meal intake!  Will order labs for Monday 8. Hypertension.   No current antihypertensive medication. Zanaflex may lower BP  Patient on Cozaar 100 mg daily prior to admission.   Since BP increasing will restart cozaar at 50mg  9. Diabetes mellitus of peripheral neuropathy. Hemoglobin A1c 8.1. Check blood sugars before meals and at bedtime.   Patient on Glucophage ex RR 500 mg twice daily prior to admission   CBG>250 started amaryl 1mg , will increase to 2mg  , when creat is down then may restart glucophage 10. Hyperlipidemia. Lipitor 11. BPH. Check PVRs 3. Resumed Proscar 5 mg daily 12. History of gout.Colchicine. Monitor for any gout flareup 13. AKI  Cr. 1.47 on 3/2  Will cont to monitor  LOS (Days) 3 A FACE TO FACE EVALUATION WAS PERFORMED  Erick Colace 10/10/2015 5:58 AM

## 2015-10-11 ENCOUNTER — Inpatient Hospital Stay (HOSPITAL_COMMUNITY): Payer: Medicare Other | Admitting: Physical Therapy

## 2015-10-11 ENCOUNTER — Inpatient Hospital Stay (HOSPITAL_COMMUNITY): Payer: Medicare Other

## 2015-10-11 ENCOUNTER — Inpatient Hospital Stay (HOSPITAL_COMMUNITY): Payer: Medicare Other | Admitting: Speech Pathology

## 2015-10-11 LAB — CBC WITH DIFFERENTIAL/PLATELET
BASOS ABS: 0 10*3/uL (ref 0.0–0.1)
Basophils Relative: 0 %
EOS PCT: 3 %
Eosinophils Absolute: 0.3 10*3/uL (ref 0.0–0.7)
HEMATOCRIT: 38.9 % — AB (ref 39.0–52.0)
HEMOGLOBIN: 12.7 g/dL — AB (ref 13.0–17.0)
LYMPHS ABS: 1.4 10*3/uL (ref 0.7–4.0)
Lymphocytes Relative: 15 %
MCH: 29.4 pg (ref 26.0–34.0)
MCHC: 32.6 g/dL (ref 30.0–36.0)
MCV: 90 fL (ref 78.0–100.0)
Monocytes Absolute: 0.8 10*3/uL (ref 0.1–1.0)
Monocytes Relative: 9 %
Neutro Abs: 6.4 10*3/uL (ref 1.7–7.7)
Neutrophils Relative %: 73 %
Platelets: 174 10*3/uL (ref 150–400)
RBC: 4.32 MIL/uL (ref 4.22–5.81)
RDW: 14.9 % (ref 11.5–15.5)
WBC: 8.9 10*3/uL (ref 4.0–10.5)

## 2015-10-11 LAB — BASIC METABOLIC PANEL
ANION GAP: 11 (ref 5–15)
BUN: 32 mg/dL — AB (ref 6–20)
CHLORIDE: 107 mmol/L (ref 101–111)
CO2: 26 mmol/L (ref 22–32)
Calcium: 9.2 mg/dL (ref 8.9–10.3)
Creatinine, Ser: 1.25 mg/dL — ABNORMAL HIGH (ref 0.61–1.24)
GFR calc Af Amer: >60 mL/min (ref >60)
GFR, EST NON AFRICAN AMERICAN: 52 mL/min — AB (ref >60)
GLUCOSE: 141 mg/dL — AB (ref 65–99)
POTASSIUM: 3.9 mmol/L (ref 3.5–5.1)
Sodium: 144 mmol/L (ref 135–145)

## 2015-10-11 LAB — GLUCOSE, CAPILLARY
GLUCOSE-CAPILLARY: 133 mg/dL — AB (ref 65–99)
GLUCOSE-CAPILLARY: 197 mg/dL — AB (ref 65–99)
Glucose-Capillary: 129 mg/dL — ABNORMAL HIGH (ref 65–99)
Glucose-Capillary: 205 mg/dL — ABNORMAL HIGH (ref 65–99)

## 2015-10-11 NOTE — Progress Notes (Signed)
Speech Language Pathology Daily Session Note  Patient Details  Name: Roger [name] MRN: [id number] Date of Birth: 1930-11-02  Today's Date: 10/11/2015 SLP Individual Time: 1405-1500 SLP Individual Time Calculation (min): 55 min  Short Term Goals: Week 1: SLP Short Term Goal 1 (Week 1): Patient will consume current diet with minimal overt s/s of aspiration with Mod A verbal cues for use of swallowing compensatory strategies.  SLP Short Term Goal 2 (Week 1): Patient will consume trials of ice chips and thin liquids via tsp with minimal overt s/s of aspiration in less than 30% of trials to assess readniess for repeat MBS. SLP Short Term Goal 3 (Week 1): Patient will demonstrate efficient mastication of Dys. 2 textures over 3 consecutive sessions with minimal oral residue and no overt s/s of aspiration with Mod A verbal cues prior to upgrade.  SLP Short Term Goal 4 (Week 1): Patient will utilize an increased vocal intensity and over-articulation at the word and phrase level with Mod A verbal cues to maximize intelligibility to 75%.  SLP Short Term Goal 5 (Week 1): Patient will attend to left field of enviornment during functional tasks with Min A verbal cues.  SLP Short Term Goal 6 (Week 1): Patient will demonstrate sustained attention to functional tasks for 10 minutes with Min A verbal cues for redirection.   Skilled Therapeutic Interventions:  Pt was seen for skilled ST targeting cognitive goals.  Pt in bed upon arrival but awake and agreeable to participate in therapies.  Pt requested to get out of bed for therapies and was transferred to wheelchair with max assist +2 from therapist and nurse tech.  SLP facilitated the session with a basic card game targeting sustained attention to task and visual scanning to the left of midline.  Pt attended to therapist when seated on his left in >75% of opportunities with supervision verbal cues.  Pt also sustained his attention to the abovementioned card game for  ~20 minutes with min verbal cues for redirection to task.  Pt was returned to room and left in wheelchair with quick release belt donned and call bell left within reach.  Daughter was at bedside.  Continue per current plan of care.    Function:  Eating Eating                 Cognition Comprehension Comprehension assist level: Understands basic 75 - 89% of the time/ requires cueing 10 - 24% of the time  Expression   Expression assist level: Expresses basic 50 - 74% of the time/requires cueing 25 - 49% of the time. Needs to repeat parts of sentences.  Social Interaction Social Interaction assist level: Interacts appropriately 75 - 89% of the time - Needs redirection for appropriate language or to initiate interaction.  Problem Solving Problem solving assist level: Solves basic 25 - 49% of the time - needs direction more than half the time to initiate, plan or complete simple activities  Memory Memory assist level: Recognizes or recalls 25 - 49% of the time/requires cueing 50 - 75% of the time    Pain Pain Assessment Pain Assessment: No/denies pain  Therapy/Group: Individual Therapy  Nolan Tuazon, Melanee Spry 10/11/2015, 4:18 PM

## 2015-10-11 NOTE — Progress Notes (Signed)
[place] PHYSICAL MEDICINE & REHABILITATION     PROGRESS NOTE  Subjective/Complaints:  Pt seen sitting up in bed this AM working with therapies.  Daughter at bedside.  He states he had a good weekend.   ROS: Denies CP, SOB, n/v/d.   Objective: Vital Signs: Blood pressure 149/70, pulse 83, temperature 98.2 F (36.8 C), temperature source Oral, resp. rate 18, height 5\' 9"  (1.753 m), weight 64.2 kg (141 lb 8.6 oz), SpO2 97 %. No results found.  Recent Labs  10/11/15 0618  WBC 8.9  HGB 12.7*  HCT 38.9*  PLT 174    Recent Labs  10/11/15 0618  NA 144  K 3.9  CL 107  GLUCOSE 141*  BUN 32*  CREATININE 1.25*  CALCIUM 9.2   CBG (last 3)   Recent Labs  10/10/15 1635 10/10/15 2056 10/11/15 0642  GLUCAP 269* 132* 129*    Wt Readings from Last 3 Encounters:  10/07/15 64.2 kg (141 lb 8.6 oz)  10/07/15 66.2 kg (145 lb 15.1 oz)  02/20/14 63.957 kg (141 lb)    Physical Exam:  BP 149/70 mmHg  Pulse 83  Temp(Src) 98.2 F (36.8 C) (Oral)  Resp 18  Ht 5\' 9"  (1.753 m)  Wt 64.2 kg (141 lb 8.6 oz)  BMI 20.89 kg/m2  SpO2 97% HENT: Normocephalic, Atraumatic.  Eyes: EOM and Conj are normal.  Cardiovascular: Normal rate and regular rhythm. Grade 3 systolic ejection murmur Respiratory: Effort normal and breath sounds normal. No respiratory distress.  GI: Soft. Bowel sounds are normal. He exhibits no distension.  Neurological:  Patient is alert and oriented x2 with increased time and cues.   Moderate dysarthria.  Poor historian.  He will follow some simple verbal commands.  Left facial droop  Motor: LUE: 0/5 LLE: hip flexion 1/5, distally 0/5 RUE: 5/5 proximal to distal LLE: 4+/5 proximal to distal Skin: Skin is warm and dry. Grade 1 sacral decubitus, hips and heels without evidence of breakdown   Assessment/Plan: 1. 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 Physiatrist is providing close team supervision and 24 hour management of active medical problems listed below. Physiatrist and rehab team continue to assess barriers to discharge/monitor patient progress toward functional and medical goals.  Function:  Bathing Bathing position      Bathing parts      Bathing assist        Upper Body Dressing/Undressing Upper body dressing                    Upper body assist        Lower Body Dressing/Undressing Lower body dressing                                  Lower body assist        Toileting Toileting Toileting activity did not occur: No continent bowel/bladder event        Toileting assist     Transfers Chair/bed transfer   Chair/bed transfer method: Squat pivot Chair/bed transfer assist level: Total assist (Pt < 25%) Chair/bed transfer assistive device:  (steady lift)     Locomotion Ambulation Ambulation activity did not occur: Safety/medical concerns         Wheelchair Wheelchair activity did not occur: Safety/medical concerns Type: Manual Max wheelchair distance: 20 Assist  Level: Maximal assistance (Pt 25 - 49%)  Cognition Comprehension Comprehension assist level: Understands basic 75 - 89% of the time/ requires cueing 10 - 24% of the time  Expression Expression assist level: Expresses basic 50 - 74% of the time/requires cueing 25 - 49% of the time. Needs to repeat parts of sentences.  Social Interaction Social Interaction assist level: Interacts appropriately 75 - 89% of the time - Needs redirection for appropriate language or to initiate interaction.  Problem Solving Problem solving assist level: Solves basic 25 - 49% of the time - needs direction more than half the time to initiate, plan or complete simple activities  Memory Memory assist level: Recognizes or recalls 25 - 49% of the time/requires cueing 50 - 75% of the time     Medical Problem  List and Plan: 1. 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 DVT Prophylaxis/Anticoagulation: Subcutaneous heparin. Monitor platelet count and any signs of bleeding 3. Pain Management: Tylenol as needed 4. Mood/dementia: Discuss cognitive baseline with family. Resumed Namenda 5. Neuropsych: This patient is capable of making decisions on his own behalf. 6. Skin/Wound Care: Routine skin checks 7. Fluids/Electrolytes/Nutrition: Routine I&O's   Dysphagia 1, Nectar thick 8. Hypertension.   No current antihypertensive medication.   Patient on Cozaar 100 mg daily prior to admission.   Cozaar  restarted on 3/5, will increase as necessary 9. Diabetes mellitus of peripheral neuropathy. Hemoglobin A1c 8.1. Check blood sugars before meals and at bedtime.   Patient on Glucophage ex RR 500 mg twice daily prior to admission   Amaryl increased to  on 3/5  Will monitor Cr and consider restarting glucophage  CBG 10. Hyperlipidemia. Lipitor 11. BPH. Resumed Proscar 5 mg daily 12. History of gout.Colchicine. Monitor for any gout flareup 13. AKI: Improving  Cr. 1.25 on 3/6  Will cont to monitor  LOS (Days) 4 A FACE TO FACE EVALUATION WAS PERFORMED  Alexei Doswell Karis Juba 10/11/2015 8:32 AM

## 2015-10-11 NOTE — Progress Notes (Signed)
Patient information reviewed and entered into eRehab system by Tamario Heal, RN, CRRN, PPS Coordinator.  Information including medical coding and functional independence measure will be reviewed and updated through discharge.     Per nursing patient was given "Data Collection Information Summary for Patients in Inpatient Rehabilitation Facilities with attached "Privacy Act Statement-Health Care Records" upon admission.  

## 2015-10-11 NOTE — Progress Notes (Signed)
Pt. found off Home BiPAP, made aware to notify if help needed for placement, RN on floor made aware.

## 2015-10-11 NOTE — Progress Notes (Signed)
Occupational Therapy Session Note  Patient Details  Name: Roger [name] MRN: [id number] Date of Birth: [birth year]  Today's Date: 10/11/2015 OT Individual Time: 0730-0900 OT Individual Time Calculation (min): 90 min    Short Term Goals: Week 1:  OT Short Term Goal 1 (Week 1): Pt will complete upper body dressing sitting at EOB with mod assist OT Short Term Goal 2 (Week 1): Pt will complete transfer to [facility] with mod assist OT Short Term Goal 3 (Week 1): Pt will maintian supported static standing balance during assisted lower body dressing at sink independently OT Short Term Goal 4 (Week 1): Pt will bathe 4 of 10 body parts with supervision after setup at sink OT Short Term Goal 5 (Week 1): Pt will complete 2 of 5 grooming task with min vc for thoroughness  Skilled Therapeutic Interventions/Progress Updates: ADL-retraining with focus on use of AE for self-feeding, bed mobility, sitting balance, transfers (w/c and toilet), toileting, adapted bathing/dressing at sink, static standing balance, weight-shifting, and improved left attention.   Pt received supine in bed, HOB elevated self-feeding with daughter assisting.   OT educated pt and daughter on use of AE (dicem mat, plate guard) as well as benefit of moving items off tray onto over bed table to simplify presentation.   Pt completes self-feeding to his satisfaction with overall min assist.   Pt re-educated on bed mobility, use of DME, weight-shifting and attention to left.   Pt and daughter educated on [place] transfer to w/c, squat-pivot.   OT notes increased tone at upper-extremity and dominance of posterior pelvic tilt requiring repetition of facilitation to perform right anterior leaning and manual placement of feet to improve BOS and shift COG.   Pt setup for bathing/dressing at sink but requested to toilet immediately after setup.   Pt completes transfer to [facility] over toilet (near total assist d/t architectural barriers).  Pt requires total assist to  toilet although able to perform squat with max assist for assisted hygiene.   Pt returns to w/c and bathes and dresses seated with overall mod assist to bathe/dress left leg and right arm and max assist to stand supported at sink to provide assist with lower body dressing.   Pt dons shirt with only mod assist and remained in w/c at end of session with daughter present.     Therapy Documentation Precautions:  Precautions Precautions: Fall Restrictions Weight Bearing Restrictions: No  Vital Signs: Therapy Vitals Temp: 98.2 F (36.8 C) Temp Source: Oral Pulse Rate: 83 Resp: 18 BP: (!) 149/70 mmHg Patient Position (if appropriate): Lying Oxygen Therapy SpO2: 97 % O2 Device: Not Delivered   Pain: No/denies pain   ADL: ADL ADL Comments: see Functional Assessment Tool  See Function Navigator for Current Functional Status.   Therapy/Group: Individual Therapy  Michaeline Eckersley 10/11/2015, 9:25 AM

## 2015-10-11 NOTE — Progress Notes (Signed)
Physical Therapy Session Note  Patient Details  Name: Roger [name] MRN: [id number] Date of Birth: 1930/09/28  Today's Date: 10/11/2015 PT Individual Time: 0932-1032 PT Individual Time Calculation (min): 60 min   Short Term Goals: Week 1:  PT Short Term Goal 1 (Week 1): Pt will be able to transfer supine<>sit with Min A with use of bed features. PT Short Term Goal 2 (Week 1): Pt will be able to transfer sit<>stand with Min A. PT Short Term Goal 3 (Week 1): Pt will be able to perform stand pivot tranfsfers with Mod A. PT Short Term Goal 4 (Week 1): Pt will be able to tolerate sitting at EOB with Min A and able to correct LOB & posture.  Skilled Therapeutic Interventions/Progress Updates:    Pt received sitting in w/c, daughter present, and agreeable to PT. Pt's BP at rest = 117/74 mmHg. Pt educated on sliding board transfer & required total A for set up. Pt transferred w/c>bed to L with sliding board & +2 assist (PT and NT).  Pt able to initiate pushes but required assistance to progress buttocks across board. Once sitting at EOB pt able to maintain balance for 3 minutes, but then began pushing to L instead of leaning to R. PT provided max cuing to attempt help pt correct balance. Pt then performed sliding board transfer bed>w/c to R with Mod A +1 due to scooting from elevated surface to lower level. PT provided max cuing for hand placement & sequencing. Pt then propelled w/c ~125 ft with RUE & LLE. Pt with L neglect requiring maximum cuing to turn head to L to avoid running into wall/obstacles. At the end of the session pt was left in w/c in room with daughter present and QRB in place.   Therapy Documentation Precautions:  Precautions Precautions: Fall Restrictions Weight Bearing Restrictions: No   Vital Signs: Therapy Vitals BP: 117/74 mmHg Patient Position (if appropriate): Sitting Pain: Pain Assessment Pain Assessment: No/denies pain   See Function Navigator for Current Functional  Status.   Therapy/Group: Individual Therapy  Sandi Mariscal 10/11/2015, 12:45 PM

## 2015-10-11 NOTE — Progress Notes (Signed)
Recreational Therapy Session Note  Patient Details  Name: Deloss Amico MRN: [id number] Date of Birth: 12/21/1930 Today's Date: 10/11/2015  Pain: no c/o  Pt participated in animal assisted activity/therapy bed level, head of bed elevated, daughter present with supervision.  Norvel Wenker 10/11/2015, 3:39 PM

## 2015-10-12 ENCOUNTER — Inpatient Hospital Stay (HOSPITAL_COMMUNITY): Payer: Medicare Other | Admitting: Speech Pathology

## 2015-10-12 ENCOUNTER — Inpatient Hospital Stay (HOSPITAL_COMMUNITY): Payer: Medicare Other | Admitting: Physical Therapy

## 2015-10-12 ENCOUNTER — Inpatient Hospital Stay (HOSPITAL_COMMUNITY): Payer: Medicare Other

## 2015-10-12 DIAGNOSIS — M62838 Other muscle spasm: Secondary | ICD-10-CM | POA: Insufficient documentation

## 2015-10-12 DIAGNOSIS — M6249 Contracture of muscle, multiple sites: Secondary | ICD-10-CM

## 2015-10-12 LAB — GLUCOSE, CAPILLARY
GLUCOSE-CAPILLARY: 132 mg/dL — AB (ref 65–99)
GLUCOSE-CAPILLARY: 317 mg/dL — AB (ref 65–99)
Glucose-Capillary: 161 mg/dL — ABNORMAL HIGH (ref 65–99)
Glucose-Capillary: 245 mg/dL — ABNORMAL HIGH (ref 65–99)

## 2015-10-12 MED ORDER — FLUOXETINE HCL 10 MG PO CAPS
10.0000 mg | ORAL_CAPSULE | Freq: Every day | ORAL | Status: DC
  Administered 2015-10-12 – 2015-10-15 (×4): 10 mg via ORAL
  Filled 2015-10-12 (×4): qty 1

## 2015-10-12 NOTE — Progress Notes (Signed)
Speech Language Pathology Daily Session Note  Patient Details  Name: Roger [name] MRN: [id number] Date of Birth: Aug 30, 1930  Today's Date: 10/12/2015 SLP Individual Time: 1100-1156 SLP Individual Time Calculation (min): 56 min  Short Term Goals: Week 1: SLP Short Term Goal 1 (Week 1): Patient will consume current diet with minimal overt s/s of aspiration with Mod A verbal cues for use of swallowing compensatory strategies.  SLP Short Term Goal 2 (Week 1): Patient will consume trials of ice chips and thin liquids via tsp with minimal overt s/s of aspiration in less than 30% of trials to assess readniess for repeat MBS. SLP Short Term Goal 3 (Week 1): Patient will demonstrate efficient mastication of Dys. 2 textures over 3 consecutive sessions with minimal oral residue and no overt s/s of aspiration with Mod A verbal cues prior to upgrade.  SLP Short Term Goal 4 (Week 1): Patient will utilize an increased vocal intensity and over-articulation at the word and phrase level with Mod A verbal cues to maximize intelligibility to 75%.  SLP Short Term Goal 5 (Week 1): Patient will attend to left field of enviornment during functional tasks with Min A verbal cues.  SLP Short Term Goal 6 (Week 1): Patient will demonstrate sustained attention to functional tasks for 10 minutes with Min A verbal cues for redirection.   Skilled Therapeutic Interventions: Pt seen with daughter present. Focus on vocal intensity and dysphagia. Pt required consistent verbal cueing to achieve second swallow with nectar thick liquid via teaspoon. No s/s aspiration. Pt's daughter demonstrated very appropriate cueing. Max cueing of verbal, visual and modeling to achieve adequate voicing in less than 25 % of trials. Pt able to maintain adequate intensity at the automatic speech level and with single word responses, however with increased content, volume decreased. An /ah/ production for recalibration was somewhat successful to reintroduce  appropriate volume.   Function:  Eating Eating   Modified Consistency Diet: Yes Eating Assist Level: Supervision or verbal cues;More than reasonable amount of time;Help managing cup/glass           Cognition Comprehension Comprehension assist level: Understands basic 75 - 89% of the time/ requires cueing 10 - 24% of the time  Expression   Expression assist level: Expresses basic 25 - 49% of the time/requires cueing 50 - 75% of the time. Uses single words/gestures.  Social Interaction Social Interaction assist level: Interacts appropriately 75 - 89% of the time - Needs redirection for appropriate language or to initiate interaction.  Problem Solving Problem solving assist level: Solves basic 25 - 49% of the time - needs direction more than half the time to initiate, plan or complete simple activities  Memory Memory assist level: Recognizes or recalls 25 - 49% of the time/requires cueing 50 - 75% of the time    Pain Pain Assessment Pain Assessment: No/denies pain  Therapy/Group: Individual Therapy  Rocky Crafts 10/12/2015, 12:22 PM

## 2015-10-12 NOTE — Progress Notes (Signed)
Occupational Therapy Session Note  Patient Details  Name: Roger [name] MRN: [id number] Date of Birth: [birth year]  Today's Date: 10/12/2015 OT Individual Time: 6962-9528 OT Individual Time Calculation (min): 60 min   Short Term Goals: Week 1:  OT Short Term Goal 1 (Week 1): Pt will complete upper body dressing sitting at EOB with mod assist OT Short Term Goal 2 (Week 1): Pt will complete transfer to [facility] with mod assist OT Short Term Goal 3 (Week 1): Pt will maintian supported static standing balance during assisted lower body dressing at sink independently OT Short Term Goal 4 (Week 1): Pt will bathe 4 of 10 body parts with supervision after setup at sink OT Short Term Goal 5 (Week 1): Pt will complete 2 of 5 grooming task with min vc for thoroughness  Skilled Therapeutic Interventions/Progress Updates: ADL-retraining with focus on transfers (w/c<>BSC), toileting, improved weight-shifting, NMR of LUE, static standing balance, and family education.   Pt received seated in w/c finishing his breakfast.   Daughter present and interactive with OT and pt.   Pt re-educated on slide board transfer with intent to practice to bed and then to [facility] however pt reported urgent need to void BM during session and requested immediate assist with transfer.   Pt completes stand-pivot transfer to [facility] with max assist and demos improved ability to maintain standing balance and perform pivot using RLE.   Pt maintains sitting balance with min assist, intermittently, and voids large BM given extra time and assist to reposition on BSC.   After BM, pt educated on seated bathing of lower body using wash mit placed on left hand.   Pt able to assist bathing left upper leg with hand-over-hand guidance and demo's good effort to reach to his lower leg.   OT completes lower body bathing, toilet hygiene, and clothing management for patient d/t time limitation.   Pt sustains static standing balance during lower body ADL with max assist.   With setup at sink, pt performs sit<>stand 3 times with max assist to rise and manual facilitation to improve postural control while standing at sink.    Therapy Documentation Precautions:  Precautions Precautions: Fall Restrictions Weight Bearing Restrictions: No  Pain: No/denies pain     ADL: ADL ADL Comments: see Functional Assessment Tool  See Function Navigator for Current Functional Status.   Therapy/Group: Individual Therapy  Brion Sossamon 10/12/2015, 10:21 AM

## 2015-10-12 NOTE — Progress Notes (Signed)
[facility] PHYSICAL MEDICINE & REHABILITATION     PROGRESS NOTE  Subjective/Complaints:  Pt seen sitting up in his chair eating breakfast.  He slept well overnight and is more interactive and jovial today.    ROS: Denies CP, SOB, n/v/d.   Objective: Vital Signs: Blood pressure 149/76, pulse 73, temperature 98.1 F (36.7 C), temperature source Oral, resp. rate 18, height  (1.753 m), weight 64.2 kg (141 lb 8.6 oz), SpO2 98 %. No results found.  Recent Labs  10/11/15 0618  WBC 8.9  HGB 12.7*  HCT 38.9*  PLT 174    Recent Labs  10/11/15 0618  NA 144  K 3.9  CL 107  GLUCOSE 141*  BUN 32*  CREATININE 1.25*  CALCIUM 9.2   CBG (last 3)   Recent Labs  10/11/15 1731 10/11/15 2048 10/12/15 0654  GLUCAP 133* 197* 132*    Wt Readings from Last 3 Encounters:  10/07/15 64.2 kg (141 lb 8.6 oz)  10/07/15 66.2 kg (145 lb 15.1 oz)  02/20/14 63.957 kg (141 lb)    Physical Exam:  BP 149/76 mmHg  Pulse 73  Temp(Src) 98.1 F (36.7 C) (Oral)  Resp 18  Ht  (1.753 m)  Wt 64.2 kg (141 lb 8.6 oz)  BMI 20.89 kg/m2  SpO2 98% HENT: Normocephalic, Atraumatic.  Eyes: EOM and Conj are normal.  Cardiovascular: Normal rate and regular rhythm. Grade 3 systolic ejection murmur Respiratory: Effort normal and breath sounds normal. No respiratory distress.  GI: Soft. Bowel sounds are normal. He exhibits no distension.  Neurological:  Patient is alert and oriented x2 with increased time and cues.   Moderate dysarthria.  He will follow simple verbal commands.  Left facial droop  Motor: LUE: 0/5 distal to shoulder mAS 2/4 elbow flexion, wrist flexion LLE: hip flexion 1/5, distally 0/5 RUE: 5/5 proximal to distal LLE: 4+/5 proximal to distal Skin: Skin is warm and dry. Grade 1 sacral decubitus, hips and heels without evidence of breakdown   Assessment/Plan: 1. 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 Physiatrist is providing close team supervision and 24 hour management of active medical problems listed below. Physiatrist and rehab team continue to assess barriers to discharge/monitor patient progress toward functional and medical goals.  Function:  Bathing Bathing position   Position: Bed  Bathing parts Body parts bathed by patient: Right arm, Left arm, Chest, Abdomen, Front perineal area Body parts bathed by helper: Buttocks, Right upper leg, Left upper leg, Right lower leg, Left lower leg, Back  Bathing assist Assist Level: Touching or steadying assistance(Pt > 75%)      Upper Body Dressing/Undressing Upper body dressing   What is the patient wearing?: Pull over shirt/dress     Pull over shirt/dress - Perfomed by patient: Thread/unthread right sleeve, Put head through opening Pull over shirt/dress - Perfomed by helper: Thread/unthread left sleeve, Pull shirt over trunk        Upper body assist        Lower Body Dressing/Undressing Lower body dressing   What is the patient wearing?: Non-skid slipper socks, Pants     Pants- Performed by patient: Thread/unthread right pants leg Pants- Performed by helper: Thread/unthread right pants leg, Thread/unthread left pants leg, Pull pants up/down   Non-skid slipper socks- Performed by helper: Don/doff right sock, Don/doff left sock  Lower body assist        Toileting Toileting Toileting activity did not occur: No continent bowel/bladder event Toileting steps completed by patient: Adjust clothing prior to toileting Toileting steps completed by helper: Performs perineal hygiene, Adjust clothing after toileting    Toileting assist Assist level: Touching or steadying assistance (Pt.75%)   Transfers Chair/bed transfer   Chair/bed transfer method: Lateral scoot (sliding board) Chair/bed transfer assist level: 2  helpers Chair/bed transfer assistive device: Sliding board     Locomotion Ambulation Ambulation activity did not occur: Safety/medical concerns         Wheelchair Wheelchair activity did not occur: Safety/medical concerns Type: Manual Max wheelchair distance: 125 Assist Level: Moderate assistance (Pt 50 - 74%)  Cognition Comprehension Comprehension assist level: Understands basic 75 - 89% of the time/ requires cueing 10 - 24% of the time  Expression Expression assist level: Expresses basic 50 - 74% of the time/requires cueing 25 - 49% of the time. Needs to repeat parts of sentences.  Social Interaction Social Interaction assist level: Interacts appropriately 75 - 89% of the time - Needs redirection for appropriate language or to initiate interaction.  Problem Solving Problem solving assist level: Solves basic 25 - 49% of the time - needs direction more than half the time to initiate, plan or complete simple activities  Memory Memory assist level: Recognizes or recalls 25 - 49% of the time/requires cueing 50 - 75% of the time     Medical Problem List and Plan: 1. 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 DVT Prophylaxis/Anticoagulation: Subcutaneous heparin. Monitor platelet count and any signs of bleeding 3. Pain Management: Tylenol as needed 4. Mood/dementia: Discuss cognitive baseline with family. Resumed Namenda 5. Neuropsych: This patient is not capable of making decisions on his own behalf. 6. Skin/Wound Care: Routine skin checks 7. Fluids/Electrolytes/Nutrition: Routine I&O's   Dysphagia 1, Nectar thick 8. Hypertension.   No current antihypertensive medication.   Patient on Cozaar 100 mg daily prior to admission.   Cozaar 50mg  restarted on 3/5, will increase as necessary 9. Diabetes mellitus of peripheral neuropathy. Hemoglobin A1c  8.1. Check blood sugars before meals and at bedtime.   Patient on Glucophage ex RR 500 mg twice daily prior to admission   Amaryl increased to 2mg  on 3/5  Will monitor Cr and consider restarting glucophage  Fasting CBG 132 this AM 10. Hyperlipidemia. Lipitor 11. BPH. Resumed Proscar 5 mg daily 12. History of gout.Colchicine. Monitor for any gout flareup 13. AKI: Improving  Cr. 1.25 on 3/6  Will cont to monitor 14. Spasticity  Increased tone LUE  Cont zanaflex 2mg  TID, will titrate as necessary  Will order LUE splints  LOS (Days) 5 A FACE TO FACE EVALUATION WAS PERFORMED  Roger [name] 10/12/2015 7:58 AM

## 2015-10-12 NOTE — Progress Notes (Signed)
Orthopedic Tech Progress Note Patient Details:  Roger [name] 1931/01/16 [id number]  Patient ID: Roger [name], male   DOB: Apr 07, 1931, 82 y.o.   MRN: [id number]   Roger [name] 10/12/2015, 9:23 AMCalled [employer] for left resting hand splint.

## 2015-10-12 NOTE — Progress Notes (Signed)
Physical Therapy Session Note  Patient Details  Name: Roger [name] MRN: [id number] Date of Birth: [birth year]  Today's Date: 10/12/2015 PT Individual Time: [phone] and 1501-1531 PT Individual Time Calculation (min): 60 min and 30 min   Short Term Goals: Week 1:  PT Short Term Goal 1 (Week 1): Pt will be able to transfer supine<>sit with Min A with use of bed features. PT Short Term Goal 2 (Week 1): Pt will be able to transfer sit<>stand with Min A. PT Short Term Goal 3 (Week 1): Pt will be able to perform stand pivot tranfsfers with Mod A. PT Short Term Goal 4 (Week 1): Pt will be able to tolerate sitting at EOB with Min A and able to correct LOB & posture.  Skilled Therapeutic Interventions/Progress Updates:    Treatment 1: Pt received in w/c, daughter present, & agreeable to PT. Pt propelled w/c out of room with RUE & RLE, with improving ability to steer with RLE but pt continues to demonstrate significant L inattention & requires cuing to avoid wall/other obstacles. Pt able to propel w/c ~100 ft with min/mod A for steering from PT. Once in gym pt transferred w/c>mat table with sliding board, requiring total A from PT for w/c set up & positioning sliding board. PT provided maximum cuing for hand placement & sequencing; pt able to initiate pushes but had difficulty moving buttocks across board, thus requiring total A from PT to perform transfer. While sitting at edge of mat table tx focused on sitting balance; mirror was used for visual feedback. Pt with difficulty maintaining upright sitting for long periods of time, as pt tends to lean laterally to L & anteriorly. Pt requires significant time to correct balance, but still has difficulty even with visual & tactile feed back. PT also encouraged pt to identify colors of objects to his left to help decrease L inattention. Pt transferred mat>w/c in same manner as documented above. Pt then stood in standing frame; pt required Max A to transfer sit>stand  for sling placement. Pt tolerated standing frame ~5 minutes, demonstrating L lateral lean and using RUE for balance & support. Pt able to weight shift and correct standing posture but unable to maintain position. Pt continued to identify objects to L during standing. Pt then transferred back to w/c & back to room via total A for time management. Pt left in w/c with QRB in place & daughter present.   Treatment 2: Pt received in bed, with family present, and agreeable to PT with treatment session focusing bed mobility. Pt is able to roll to L with supervision assistance & cuing for RLE placement. To roll R pt requires mod A; PT cued pt to utilize RUE to assist with moving L, turning head to R and initiating roll, however pt with great difficulty with task. Once in L sidelying PT educated pt to hook RLE under LLE & transfer LE's off edge of bed with Max A, then push up with RUE on bed. Pt able to initiate task but unable to support trunk enough with RUE to transfer to sitting, and required total A from PT. Once sitting at EOB pt required mod/max A to maintain sitting balance. Pt able to scoot to edge of bed by leaning back & scooting buttocks forward. PT educated pt to hook LLE with RLE & pull LE's onto bed; pt able to complete this with max A & required max A to slowly lower trunk to bed in L sidelying. Pt then able to  roll to supine with supervision A & scoot to head of bed with PT blocking RLE, and pt pushing with RLE & pulling up with RUE. Pt required total A to completely scoot to head of bed. PT performed LLE PROM to knee, hip & ankle joints. Pt with resistance to PROM L hip abduction that began to dissipate with stretching. PT repositioned LUE arm splint & pt left with family to supervise at end of session.   Therapy Documentation Precautions:  Precautions Precautions: Fall Restrictions Weight Bearing Restrictions: No  Pain: Pain Assessment Pain Assessment: No/denies pain   See Function Navigator  for Current Functional Status.   Therapy/Group: Individual Therapy  Sandi Mariscal 10/12/2015, 3:38 PM

## 2015-10-13 ENCOUNTER — Inpatient Hospital Stay (HOSPITAL_COMMUNITY): Payer: Medicare Other | Admitting: Physical Therapy

## 2015-10-13 ENCOUNTER — Inpatient Hospital Stay (HOSPITAL_COMMUNITY): Payer: Medicare Other | Admitting: Occupational Therapy

## 2015-10-13 ENCOUNTER — Inpatient Hospital Stay (HOSPITAL_COMMUNITY): Payer: Medicare Other | Admitting: Speech Pathology

## 2015-10-13 LAB — GLUCOSE, CAPILLARY
Glucose-Capillary: 122 mg/dL — ABNORMAL HIGH (ref 65–99)
Glucose-Capillary: 150 mg/dL — ABNORMAL HIGH (ref 65–99)
Glucose-Capillary: 146 mg/dL — ABNORMAL HIGH (ref 65–99)
Glucose-Capillary: 193 mg/dL — ABNORMAL HIGH (ref 65–99)

## 2015-10-13 MED ORDER — TRAZODONE HCL 50 MG PO TABS
50.0000 mg | ORAL_TABLET | Freq: Every day | ORAL | Status: DC
  Administered 2015-10-13 – 2015-10-14 (×2): 50 mg via ORAL
  Filled 2015-10-13 (×2): qty 1

## 2015-10-13 MED ORDER — LOSARTAN POTASSIUM 50 MG PO TABS
100.0000 mg | ORAL_TABLET | Freq: Every day | ORAL | Status: DC
  Administered 2015-10-14 – 2015-10-19 (×6): 100 mg via ORAL
  Filled 2015-10-13 (×7): qty 2

## 2015-10-13 NOTE — Progress Notes (Signed)
Occupational Therapy Note  Patient Details  Name: Roger [name] MRN: [id number] Date of Birth: Dec 11, 1930  Today's Date: 10/13/2015 OT Individual Time: 1000-1100 OT Individual Time Calculation (min): 60 min   1:1 self care retraining. Pt requested to go to the bathroom when arrived. Focused on stand pivot transfer with grab bar to regular toilet. Pt able to transfer with max A with +2 for clothing management with max sit to stand. Pt able to maintain sitting balance on toilet with mod VC and use of grab bar on right side to maintain balance at midline. Pt transferred from toilet to shower chair in shower via the STEDY.  Pt able to maintain supported seated balance with occassional min A with min VC.  Shower chair facing outwards for pt to use grab bar on the right to help right himself and find and sustain midline. Pt transferred back into the room and into the w/c with STEDY.  Pt able to initate minimal movement in left Le to "kick out" into pant leg.  Sit to stand with max A with A from the front with +2 performing clothing management.  Pt very tearful in session ?libility.  Daughter present for session.     Roney Mans [facility] 10/13/2015, 3:55 PM

## 2015-10-13 NOTE — Progress Notes (Signed)
Speech Language Pathology Daily Session Note  Patient Details  Name: Roger [name] MRN: [id number] Date of Birth: [birth year]  Today's Date: 10/13/2015 SLP Individual Time: 0900-1000 SLP Individual Time Calculation (min): 60 min  Short Term Goals: Week 1: SLP Short Term Goal 1 (Week 1): Patient will consume current diet with minimal overt s/s of aspiration with Mod A verbal cues for use of swallowing compensatory strategies.  SLP Short Term Goal 2 (Week 1): Patient will consume trials of ice chips and thin liquids via tsp with minimal overt s/s of aspiration in less than 30% of trials to assess readniess for repeat MBS. SLP Short Term Goal 3 (Week 1): Patient will demonstrate efficient mastication of Dys. 2 textures over 3 consecutive sessions with minimal oral residue and no overt s/s of aspiration with Mod A verbal cues prior to upgrade.  SLP Short Term Goal 4 (Week 1): Patient will utilize an increased vocal intensity and over-articulation at the word and phrase level with Mod A verbal cues to maximize intelligibility to 75%.  SLP Short Term Goal 5 (Week 1): Patient will attend to left field of enviornment during functional tasks with Min A verbal cues.  SLP Short Term Goal 6 (Week 1): Patient will demonstrate sustained attention to functional tasks for 10 minutes with Min A verbal cues for redirection.   Skilled Therapeutic Interventions: Pt seen with daughter present. Focus again this date on vocal intensity and dysphagia. Pt required consistent verbal cueing to achieve second swallow with single ice chips after oral care. No s/s aspiration. Clear vocal quality throughout. Discussed importance of oral care to decrease risk of aspiration PNA. Mod cueing of verbal, visual and modeling to achieve adequate voicing at the word to phrase level. Targeted 4 commonly used phrases to practive increased volume as well as greetings. Pt's daughter to practice phrases with pt throughout the day. With increased  content, volume decreased. Conintued to use /ah/ production for recalibration to reintroduce appropriate volume.   Function:    Cognition Comprehension Comprehension assist level: Understands basic 75 - 89% of the time/ requires cueing 10 - 24% of the time  Expression   Expression assist level: Expresses basic 25 - 49% of the time/requires cueing 50 - 75% of the time. Uses single words/gestures.  Social Interaction Social Interaction assist level: Interacts appropriately 75 - 89% of the time - Needs redirection for appropriate language or to initiate interaction.  Problem Solving Problem solving assist level: Solves basic 25 - 49% of the time - needs direction more than half the time to initiate, plan or complete simple activities  Memory Memory assist level: Recognizes or recalls 25 - 49% of the time/requires cueing 50 - 75% of the time    Pain Pain Assessment Pain Assessment: No/denies pain  Therapy/Group: Individual Therapy  Rocky Crafts 10/13/2015, 11:22 AM

## 2015-10-13 NOTE — Progress Notes (Signed)
[facility]IT[facility]     PROGRESS NOTE  Subjective/Complaints:  Patient seen sitting up in bed eating breakfast this morning. Patient's daughter is at bedside. He has his wrist brace in place. He is joking around this morning.  ROS: Denies CP, SOB, n/v/d.   Objective: Vital Signs: Blood pressure 178/70, pulse 73, temperature 98.4 F (36.9 C), temperature source Oral, resp. rate 18, height  (1.753 m), weight 64.2 kg (141 lb 8.6 oz), SpO2 100 %. No results found.  Recent Labs  10/11/15 0618  WBC 8.9  HGB 12.7*  HCT 38.9*  PLT 174    Recent Labs  10/11/15 0618  NA 144  K 3.9  CL 107  GLUCOSE 141*  BUN 32*  CREATININE 1.25*  CALCIUM 9.2   CBG (last 3)   Recent Labs  10/12/15 1640 10/12/15 2038 10/13/15 0633  GLUCAP 161* 245* 122*    Wt Readings from Last 3 Encounters:  10/07/15 64.2 kg (141 lb 8.6 oz)  10/07/15 66.2 kg (145 lb 15.1 oz)  02/20/14 63.957 kg (141 lb)    Physical Exam:  BP 178/70 mmHg  Pulse 73  Temp(Src) 98.4 F (36.9 C) (Oral)  Resp 18  Ht  (1.753 m)  Wt 64.2 kg (141 lb 8.6 oz)  BMI 20.89 kg/m2  SpO2 100% HENT: Normocephalic, Atraumatic.  Eyes: EOM and Conj are normal.  Cardiovascular: Normal rate and regular rhythm. Grade 3 systolic ejection murmur Respiratory: Effort normal and breath sounds normal. No respiratory distress.  GI: Soft. Bowel sounds are normal. He exhibits no distension.  Neurological:  Patient is alert and oriented x2 with increased time and cues.   Moderate dysarthria.  He will follow simple verbal commands.  Left facial droop  Motor: LUE: 1/5 elbow flexion/extension, 0/5 distally mAS  1/4 shoulder abduction, 2/4 elbow flexion, wrist flexion LLE: hip flexion 1/5, distally 0/5 RUE: 5/5 proximal to distal LLE: 4+/5 proximal to distal Skin: Skin is warm and dry. Grade 1 sacral decubitus, hips and heels without evidence of breakdown  Assessment/Plan: 1. 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 Physiatrist is providing close team supervision and 24 hour management of active medical problems listed below. Physiatrist and rehab team continue to assess barriers to discharge/monitor patient progress toward functional and medical goals.  Function:  Bathing Bathing position   Position: Wheelchair/chair at sink  Bathing parts Body parts bathed by patient: Right upper leg, Left upper leg, Front perineal area Body parts bathed by helper: Buttocks, Right lower leg, Left lower leg  Bathing assist Assist Level: Touching or steadying assistance(Pt > 75%)      Upper Body Dressing/Undressing Upper body dressing   What is the patient wearing?: Pull over shirt/dress     Pull over shirt/dress - Perfomed by patient: Thread/unthread right sleeve, Put head through opening Pull over shirt/dress - Perfomed by helper: Thread/unthread left sleeve, Pull shirt over trunk        Upper body assist        Lower Body Dressing/Undressing Lower body dressing   What is the patient wearing?: Underwear, Pants, [employer], Shoes   Underwear - Performed by helper: Thread/unthread left underwear leg, Pull underwear up/down, Thread/unthread right underwear leg Pants- Performed by patient: Thread/unthread right pants leg Pants- Performed by helper: Thread/unthread right pants leg, Pull pants up/down, Thread/unthread left pants leg, Fasten/unfasten pants  Non-skid slipper socks- Performed by helper: Don/doff right sock, Don/doff left sock       Shoes - Performed by helper: Don/doff right shoe, Don/doff left shoe, Fasten right, Fasten left       TED Hose - Performed by helper: Don/doff right TED hose, Don/doff left TED hose  Lower body assist Assist for lower body dressing: Touching or steadying assistance (Pt > 75%)       Toileting Toileting Toileting activity did not occur: No continent bowel/bladder event Toileting steps completed by patient: Adjust clothing prior to toileting Toileting steps completed by helper: Adjust clothing prior to toileting, Performs perineal hygiene, Adjust clothing after toileting    Toileting assist Assist level: Touching or steadying assistance (Pt.75%)   Transfers Chair/bed transfer   Chair/bed transfer method: Lateral scoot Chair/bed transfer assist level: Total assist (Pt < 25%) Chair/bed transfer assistive device: Sliding board     Locomotion Ambulation Ambulation activity did not occur: Safety/medical concerns         Wheelchair Wheelchair activity did not occur: Safety/medical concerns Type: Manual Max wheelchair distance: 100 Assist Level: Touching or steadying assistance (Pt > 75%), Moderate assistance (Pt 50 - 74%)  Cognition Comprehension Comprehension assist level: Understands basic 75 - 89% of the time/ requires cueing 10 - 24% of the time  Expression Expression assist level: Expresses basic 25 - 49% of the time/requires cueing 50 - 75% of the time. Uses single words/gestures.  Social Interaction Social Interaction assist level: Interacts appropriately 75 - 89% of the time - Needs redirection for appropriate language or to initiate interaction.  Problem Solving Problem solving assist level: Solves basic 25 - 49% of the time - needs direction more than half the time to initiate, plan or complete simple activities  Memory Memory assist level: Recognizes or recalls 25 - 49% of the time/requires cueing 50 - 75% of the time     Medical Problem List and Plan: 1. 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 DVT Prophylaxis/Anticoagulation: Subcutaneous heparin. Monitor platelet count and any signs of bleeding 3. Pain  Management: Tylenol as needed 4. Mood/dementia: Discuss cognitive baseline with family. Resumed Namenda 5. Neuropsych: This patient is not capable of making decisions on his own behalf. 6. Skin/Wound Care: Routine skin checks 7. Fluids/Electrolytes/Nutrition: Routine I&O's   Dysphagia 1, Nectar thick 8. Hypertension.   No current antihypertensive medication.   Patient on Cozaar 100 mg daily prior to admission.   Cozaar 50mg  restarted on 3/5, increased to 100 mg on 3/8  9. Diabetes mellitus of peripheral neuropathy. Hemoglobin A1c 8.1. Check blood sugars before meals and at bedtime.   Patient on Glucophage ex RR 500 mg twice daily prior to admission   Amaryl increased to 2mg  on 3/5  Will monitor Cr and consider restarting glucophage  Fasting CBG 122 AM, With one spike yesterday  10. Hyperlipidemia. Lipitor 11. BPH. Resumed Proscar 5 mg daily 12. History of gout.Colchicine. Monitor for any gout flareup 13. AKI: Improving  Cr. 1.25 on 3/6  Will cont to monitor 14. Spasticity  Increased tone LUE  Cont zanaflex 2mg  TID, will titrate as necessary  Will order LUE splints  LOS (Days) 6 A FACE TO FACE EVALUATION WAS PERFORMED  Ankit Karis Juba 10/13/2015 8:40 AM

## 2015-10-13 NOTE — Progress Notes (Signed)
Physical Therapy Session Note  Patient Details  Name: Roger [name] MRN: [id number] Date of Birth: 07/29/1931  Today's Date: 10/13/2015 PT Individual Time: 800-900 and 4098-1191 PT Individual Time Calculation (min): 60 minutes and 29 min   Short Term Goals: Week 1:  PT Short Term Goal 1 (Week 1): Pt will be able to transfer supine<>sit with Min A with use of bed features. PT Short Term Goal 2 (Week 1): Pt will be able to transfer sit<>stand with Min A. PT Short Term Goal 3 (Week 1): Pt will be able to perform stand pivot tranfsfers with Mod A. PT Short Term Goal 4 (Week 1): Pt will be able to tolerate sitting at EOB with Min A and able to correct LOB & posture.  Skilled Therapeutic Interventions/Progress Updates:    Treatment 1: Pt received in bed, eating breakfast with assistance from daughter. PT helped assist/direct pt while eating, pt required cuing to swallow multiple times between bites (per ST protocol). PT then assisted pt with donning clothing. Pt required Total A to thread LUE into shirt, but pt able to pull shirt over his head. PT educated pt on rolling L & R to help pull down shirt completely & to don pants. Pt required total A to thread pants.  PT educated pt to hold LUE with RUE, turn head, and use momentum to roll to R to allow complete donning of clothes; pt unable to do this and required Mod A to roll to R. Pt reported urgent need to use bathroom, pt rolled L with Supervision for bed pan placement. Afterwards pt rolled L & transferred sidelying to sit with max cuing and Mod A to hook LLE with RLE & transfer LE's off of bed. Pt instructed to put RUE hand in fist & push up on bed. Pt able to perform more of task but still required Max A to come to full upright.  PT provided total A for sliding board placement & pt transferred bed>w/c with Max A initially, progressing to Mod A as pt progressed over board & was moving from elevated to lower surface. At end of session pt left in w/c with all  needs met & family present to supervise.  Treatment 2: Pt received sitting in w/c, transferred to gym via total A for time management. Pt required Max A to transfer sit>squat for standing frame sling placement. Pt tolerated standing in standing frame for ~8 minutes, requiring R UE for support and to help maintain balance. PT instructed pt to place BUE flat on standing frame table and attempt to maintain balance. Pt with significant lateral lean to L, requiring maximum cuing to weight shift to right. Pt able to shift hips to R & demonstrate more upright posture, but unable to maintain position. Pt performed reaching across midline to L side of body to obtain objects to address his L inattention & balance. Pt then transferred back to w/c & returned to room total A. At end of session pt left in w/c with QRB in place, daughter present & all needs met.  Therapy Documentation Precautions:  Precautions Precautions: Fall Restrictions Weight Bearing Restrictions: No  Pain: Pain Assessment Pain Assessment: No/denies pain   See Function Navigator for Current Functional Status.   Therapy/Group: Individual Therapy  Waunita Schooner 10/13/2015, 4:19 PM

## 2015-10-14 ENCOUNTER — Inpatient Hospital Stay (HOSPITAL_COMMUNITY): Payer: Medicare Other

## 2015-10-14 ENCOUNTER — Inpatient Hospital Stay (HOSPITAL_COMMUNITY): Payer: Medicare Other | Admitting: Physical Therapy

## 2015-10-14 ENCOUNTER — Inpatient Hospital Stay (HOSPITAL_COMMUNITY): Payer: Medicare Other | Admitting: Speech Pathology

## 2015-10-14 LAB — GLUCOSE, CAPILLARY
GLUCOSE-CAPILLARY: 200 mg/dL — AB (ref 65–99)
Glucose-Capillary: 141 mg/dL — ABNORMAL HIGH (ref 65–99)
Glucose-Capillary: 159 mg/dL — ABNORMAL HIGH (ref 65–99)
Glucose-Capillary: 195 mg/dL — ABNORMAL HIGH (ref 65–99)

## 2015-10-14 NOTE — Progress Notes (Signed)
Speech Language Pathology Daily Session Note  Patient Details  Name: Roger [name] MRN: [id number] Date of Birth: 1931/04/27  Today's Date: 10/14/2015 SLP Individual Time: 1445-1530 SLP Individual Time Calculation (min): 45 min  Short Term Goals: Week 1: SLP Short Term Goal 1 (Week 1): Patient will consume current diet with minimal overt s/s of aspiration with Mod A verbal cues for use of swallowing compensatory strategies.  SLP Short Term Goal 2 (Week 1): Patient will consume trials of ice chips and thin liquids via tsp with minimal overt s/s of aspiration in less than 30% of trials to assess readniess for repeat MBS. SLP Short Term Goal 3 (Week 1): Patient will demonstrate efficient mastication of Dys. 2 textures over 3 consecutive sessions with minimal oral residue and no overt s/s of aspiration with Mod A verbal cues prior to upgrade.  SLP Short Term Goal 4 (Week 1): Patient will utilize an increased vocal intensity and over-articulation at the word and phrase level with Mod A verbal cues to maximize intelligibility to 75%.  SLP Short Term Goal 5 (Week 1): Patient will attend to left field of enviornment during functional tasks with Min A verbal cues.  SLP Short Term Goal 6 (Week 1): Patient will demonstrate sustained attention to functional tasks for 10 minutes with Min A verbal cues for redirection.   Skilled Therapeutic Interventions: Skilled treatment session provided focusing on speech intelligibility. Pt required max verbal cues to utilize increased volume and over-articulation. Pt with ~50% intelligiblity at the phrase level when using compensatory strategies during a verbal description task. Daughter present during session and able to facilitate use of compensatory strategies for carryover. Pt left in room upright in wheelchair with family and all needs within reach. Continue with current plan of care.    Function:   Cognition Comprehension Comprehension assist level: Understands  basic 50 - 74% of the time/ requires cueing 25 - 49% of the time  Expression   Expression assist level: Expresses basic 25 - 49% of the time/requires cueing 50 - 75% of the time. Uses single words/gestures.  Social Interaction Social Interaction assist level: Interacts appropriately 25 - 49% of time - Needs frequent redirection.  Problem Solving Problem solving assist level: Solves basic 50 - 74% of the time/requires cueing 25 - 49% of the time  Memory Memory assist level: Recognizes or recalls 25 - 49% of the time/requires cueing 50 - 75% of the time    Pain Pain Assessment Pain Assessment: No/denies pain  Therapy/Group: Individual Therapy  Lucette Kratz 10/14/2015, 3:44 PM

## 2015-10-14 NOTE — Patient Care Conference (Signed)
Inpatient RehabilitationTeam Conference and Plan of Care Update Date: 10/13/2015   Time: 2:20 PM    Patient Name: Roger [name]      Medical Record Number: [id number]  Date of Birth: 20-Oct-1930 Sex: Male         Room/Bed: 4M01C/4M01C-01 Payor Info: Payor: [employer] MEDICARE / Plan: [facility] MEDICARE / Product Type: *No Product type* /    Admitting Diagnosis: R CVA  Admit Date/Time:  10/07/2015  3:04 PM Admission Comments: No comment available   Primary Diagnosis:  <principal problem not specified> Principal Problem: <principal problem not specified>  Patient Active Problem List   Diagnosis Date Noted  . Muscle spasticity   . AKI (acute kidney injury) (HCC)   . Benign essential HTN   . Dementia   . DM type 2 with diabetic peripheral neuropathy (HCC)   . Hemiplegia affecting non-dominant side, post-stroke (HCC)   . Dysphagia as late effect of cerebrovascular disease   . Basal ganglia infarction (HCC) 10/07/2015  . Acute left hemiparesis (HCC)   . Gait disturbance, post-stroke   . HLD (hyperlipidemia)   . Slurred speech 10/04/2015  . Malignant hypertension 10/04/2015  . Diabetes (HCC) 10/04/2015  . Stroke (HCC) 10/04/2015  . Left-sided weakness   . Stroke-like symptoms   . Physical deconditioning   . Diabetes mellitus with complication (HCC)   . Alzheimer's disease 08/14/2013    Expected Discharge Date: Expected Discharge Date: 10/28/15  Team Members Present: Physician leading conference: Dr. 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 of bladder. timed tolieting during day and condom cath HS  manage bowel and bladder mod assist  establish bladder timed tolieting   Swallow/Nutrition/ Hydration   Dys 1 with nectar, mod- max  A for verbal cueing  supervision- least restrictive  min- mod A   ADL's   Max Assist for bathing, dressing, Min A self-feeding, Max A for transfers and sit<>stand.  Ovearall Min Assist for BADL, supervision for dynamic sitting balance  Sitting balance, Left attention, LUE NMR, family educaition, transfers, static standing balance   Mobility   total A for bed mobility, sliding board transfers, min/mod A for w/c mobility  Min A bed mobility, supervision for w/c propulsion, Min A bed<>chair  sitting balance, addressing L inattention, bed mobility, w/c mobility   Communication   max A verbal cueing for adequate vocal intensity  mod A  functional phrases with adequate volume   Safety/Cognition/ Behavioral Observations  mod A  min A or basic safety  using the call light effectively   Pain   denies pain         Skin   scattered bruising to arms and abodmen. sacrum reddened and blanchable.   free of skin breakdown min assist  asses skin qshift. provide pressure relief to sacrum      *See Care Plan and progress notes for long and short-term goals.  Barriers to Discharge: Elevated FSBG, elevated HTN, AKI, Spasticity  Possible Resolutions to Barriers:  Pt and family edu, adjust DM meds, optimize BP meds, monitor labs, adjust spasticity tx    Discharge Planning/Teaching Needs:  Home with wife and daughter to assist although can not priovide physical assist due to back issues-may need to hire caregivers. Both here daily to observe in therapies.      Team Discussion:  Goals-min assist wheelchair level. Incont bladder, cont bowel. L-inattention and issues with motor planning.  Dementia limits his carryover and progress in therapies. Tone in arm starting. Not ready for another MBS due to issues with controlling own secreations. See if family can provide the care pt will need at discharge.  Revisions to Treatment Plan:  None   Continued Need for Acute Rehabilitation Level of Care: The patient requires daily medical management by a physician with specialized training in physical medicine and rehabilitation for the following conditions: Daily direction of a multidisciplinary physical rehabilitation program to ensure safe treatment while eliciting the highest outcome that is of practical value to the patient.: Yes Daily medical management of patient stability for increased activity during participation in an intensive rehabilitation regime.: Yes Daily analysis of laboratory values and/or radiology reports with any subsequent need for medication adjustment of medical intervention for : Neurological problems;Diabetes problems;Blood pressure problems;Renal problems  Roger [name], Roger [name] 10/14/2015, 9:00 AM

## 2015-10-14 NOTE — Progress Notes (Signed)
Speech Language Pathology Daily Session Note  Patient Details  Name: Jereme Detoro MRN: [id number] Date of Birth: [birth year]  Today's Date: 10/14/2015 SLP Co-Treatment Time: 1015 (45 min co-tx with PT VM; total time 1015-1100)-1030 SLP Co-Treatment Time Calculation (min): 15 min  Short Term Goals: Week 1: SLP Short Term Goal 1 (Week 1): Patient will consume current diet with minimal overt s/s of aspiration with Mod A verbal cues for use of swallowing compensatory strategies.  SLP Short Term Goal 2 (Week 1): Patient will consume trials of ice chips and thin liquids via tsp with minimal overt s/s of aspiration in less than 30% of trials to assess readniess for repeat MBS. SLP Short Term Goal 3 (Week 1): Patient will demonstrate efficient mastication of Dys. 2 textures over 3 consecutive sessions with minimal oral residue and no overt s/s of aspiration with Mod A verbal cues prior to upgrade.  SLP Short Term Goal 4 (Week 1): Patient will utilize an increased vocal intensity and over-articulation at the word and phrase level with Mod A verbal cues to maximize intelligibility to 75%.  SLP Short Term Goal 5 (Week 1): Patient will attend to left field of enviornment during functional tasks with Min A verbal cues.  SLP Short Term Goal 6 (Week 1): Patient will demonstrate sustained attention to functional tasks for 10 minutes with Min A verbal cues for redirection.   Skilled Therapeutic Interventions:  Pt was seen for skilled ST/PT co-treatment targeting cognitive-linguistic goals while engaged in static sitting balance tasks.  Of note although not directly addressed, pt was consuming breakfast upon arrival and required x1 min assist verbal and tactile cue for rate and portion control when consuming nectar thick liquids.  Delayed cough followed large bolus but small sips were tolerated well without overt s/s of aspiration.  Pt required frequent max assist verbal, visual, and tactile cues to maintain midline  posture while sitting at edge of bed and during trials of transfer training with Stedy lift due to significant left inattention and decreased sustained attention to task.  Pt was able to increase vocal intensity to achieve intelligibility in phrases with mod assist verbal and visual cues.  Pt was left with PT in wheelchair.  Continue per current plan of care.   Function:  Eating Eating                 Cognition Comprehension Comprehension assist level: Understands basic 50 - 74% of the time/ requires cueing 25 - 49% of the time  Expression   Expression assist level: Expresses basic 25 - 49% of the time/requires cueing 50 - 75% of the time. Uses single words/gestures.  Social Interaction Social Interaction assist level: Interacts appropriately 25 - 49% of time - Needs frequent redirection.  Problem Solving Problem solving assist level: Solves basic 50 - 74% of the time/requires cueing 25 - 49% of the time  Memory Memory assist level: Recognizes or recalls 25 - 49% of the time/requires cueing 50 - 75% of the time    Pain Pain Assessment Pain Assessment: No/denies pain  Therapy/Group: Individual Therapy  Mathews Stuhr, Melanee Spry 10/14/2015, 3:59 PM

## 2015-10-14 NOTE — Progress Notes (Signed)
Orthopedic Tech Progress Note Patient Details:  Roger [name] [birth year] [id number]  Ortho Devices Type of Ortho Device: Velcro wrist splint (prafo boot) Ortho Device/Splint Location: lle;rue Ortho Device/Splint Interventions: Application   [name] 10/14/2015, 8:34 AM

## 2015-10-14 NOTE — Progress Notes (Signed)
[facility] PHYSICAL MEDICINE & REHABILITATION     PROGRESS NOTE  Subjective/Complaints:  Pt laying in bed this AM.  He appears to have slept well overnight.  He does not have any complaints this AM.   ROS: Denies CP, SOB, n/v/d.   Objective: Vital Signs: Blood pressure 155/78, pulse 71, temperature 97.4 F (36.3 C), temperature source Oral, resp. rate 18, height  (1.753 m), weight 61.916 kg (136 lb 8 oz), SpO2 100 %. No results found. No results for input(s): WBC, HGB, HCT, PLT in the last 72 hours. No results for input(s): NA, K, CL, GLUCOSE, BUN, CREATININE, CALCIUM in the last 72 hours.  Invalid input(s): CO CBG (last 3)   Recent Labs  10/13/15 1700 10/13/15 2047 10/14/15 0635  GLUCAP 146* 193* 141*    Wt Readings from Last 3 Encounters:  10/13/15 61.916 kg (136 lb 8 oz)  10/07/15 66.2 kg (145 lb 15.1 oz)  02/20/14 63.957 kg (141 lb)    Physical Exam:  BP 155/78 mmHg  Pulse 71  Temp(Src) 97.4 F (36.3 C) (Oral)  Resp 18  Ht  (1.753 m)  Wt 61.916 kg (136 lb 8 oz)  BMI 20.15 kg/m2  SpO2 100% HENT: Normocephalic, Atraumatic.  Eyes: EOM and Conj are normal.  Cardiovascular: Normal rate and regular rhythm. Grade 3 systolic ejection murmur Respiratory: Effort normal and breath sounds normal. No respiratory distress.  GI: Soft. Bowel sounds are normal. He exhibits no distension.  Neurological:  Patient is alert and oriented x2 with increased time and cues.   Moderate dysarthria.  He will follow simple verbal commands.  Left facial droop  Motor: LUE: 1/5 elbow flexion/extension, 0/5 distally mAS  1/4 shoulder abduction, 3/4 elbow flexion, 1/4 wrist flexion, 1+/4 ankle dorsiflexion LLE: hip flexion 1/5, distally 0/5 RUE: 5/5 proximal to distal LLE: 4+/5 proximal to distal Skin: Skin is warm and dry. Grade 1 sacral decubitus, hips and heels without evidence of breakdown  Assessment/Plan: 1. 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 Physiatrist is providing close team supervision and 24 hour management of active medical problems listed below. Physiatrist and rehab team continue to assess barriers to discharge/monitor patient progress toward functional and medical goals.  Function:  Bathing Bathing position   Position: Shower  Bathing parts Body parts bathed by patient: Left arm, Chest, Abdomen, Front perineal area, Right upper leg, Left upper leg Body parts bathed by helper: Right lower leg, Left lower leg, Back, Buttocks, Right arm  Bathing assist Assist Level: Touching or steadying assistance(Pt > 75%)      Upper Body Dressing/Undressing Upper body dressing   What is the patient wearing?: Pull over shirt/dress     Pull over shirt/dress - Perfomed by patient: Thread/unthread right sleeve, Put head through opening Pull over shirt/dress - Perfomed by helper: Thread/unthread left sleeve, Pull shirt over trunk        Upper body assist Assist Level: Touching or steadying assistance(Pt > 75%)      Lower Body Dressing/Undressing Lower body dressing   What is the patient wearing?: Pants, Ted Hose, Shoes (brief)   Underwear - Performed by helper: Thread/unthread left underwear leg, Pull underwear up/down, Thread/unthread right underwear leg Pants- Performed by patient: Thread/unthread right pants leg Pants- Performed by helper: Thread/unthread right pants leg, Pull pants up/down, Thread/unthread left pants leg, Fasten/unfasten pants   Non-skid slipper socks- Performed  by helper: Don/doff right sock, Don/doff left sock     Shoes - Performed by patient: Fasten right, Fasten left, Don/doff right shoe Shoes - Performed by helper: Don/doff left shoe       TED Hose - Performed by helper: Don/doff right TED hose, Don/doff left TED hose  Lower body assist Assist for lower body  dressing: 2 Helpers (sit to stand)      Financial trader activity did not occur: No continent bowel/bladder event Toileting steps completed by patient: Adjust clothing prior to toileting Toileting steps completed by helper: Adjust clothing prior to toileting, Performs perineal hygiene, Adjust clothing after toileting    Toileting assist Assist level: Two helpers   Transfers Chair/bed transfer   Chair/bed transfer method: Lateral scoot Chair/bed transfer assist level: Maximal assist (Pt 25 - 49%/lift and lower) Chair/bed transfer assistive device: Sliding board     Locomotion Ambulation Ambulation activity did not occur: Safety/medical concerns         Wheelchair Wheelchair activity did not occur: Safety/medical concerns Type: Manual Max wheelchair distance: 100 Assist Level: Touching or steadying assistance (Pt > 75%), Moderate assistance (Pt 50 - 74%)  Cognition Comprehension Comprehension assist level: Understands basic 75 - 89% of the time/ requires cueing 10 - 24% of the time  Expression Expression assist level: Expresses basic 25 - 49% of the time/requires cueing 50 - 75% of the time. Uses single words/gestures.  Social Interaction Social Interaction assist level: Interacts appropriately 75 - 89% of the time - Needs redirection for appropriate language or to initiate interaction.  Problem Solving Problem solving assist level: Solves basic 25 - 49% of the time - needs direction more than half the time to initiate, plan or complete simple activities  Memory Memory assist level: Recognizes or recalls 25 - 49% of the time/requires cueing 50 - 75% of the time     Medical Problem List and Plan: 1. 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 DVT Prophylaxis/Anticoagulation: Subcutaneous heparin. Monitor platelet count and any  signs of bleeding 3. Pain Management: Tylenol as needed 4. Mood/dementia: Discuss cognitive baseline with family. Resumed Namenda 5. Neuropsych: This patient is not capable of making decisions on his own behalf. 6. Skin/Wound Care: Routine skin checks 7. Fluids/Electrolytes/Nutrition: Routine I&O's   Dysphagia 1, Nectar thick 8. Hypertension.   Patient on Cozaar 100 mg daily prior to admission.   Cozaar 50mg  restarted on 3/5, increased to 100 mg on 3/8, will cont to monitor and consider further increase tomorrow if warranted  9. Diabetes mellitus of peripheral neuropathy. Hemoglobin A1c 8.1. Check blood sugars before meals and at bedtime.   Patient on Glucophage ex RR 500 mg twice daily prior to admission   Amaryl increased to 2mg  on 3/5  Will monitor Cr and consider restarting glucophage  Fasting CBG 141 AM  10. Hyperlipidemia. Lipitor 11. BPH. Resumed Proscar 5 mg daily 12. History of gout.Colchicine. Monitor for any gout flareup 13. AKI: Improving  Cr. 1.25 on 3/6  Will order labs for tomorrow 14. Spasticity  Cont zanaflex 2mg  TID, will titrate as necessary  Will consider increase zanaflex tomorrow after monitoring for effects of trazodone  Splints ordered, wrist cock up splint not yet received  LOS (Days) 7 A FACE TO FACE EVALUATION WAS PERFORMED  Elysse Polidore Karis Juba 10/14/2015 8:11 AM

## 2015-10-14 NOTE — Progress Notes (Signed)
Physical Therapy Session Note  Patient Details  Name: Roger [name] MRN: [id number] Date of Birth: September 07, 1930  Today's Date: 10/14/2015 PT Individual Time: 4098-1191  PT Individual Time Calculation (min): 62 min (30 minutes co-treat with SLP + 32 minutes individual)  Short Term Goals: Week 1:  PT Short Term Goal 1 (Week 1): Pt will be able to transfer supine<>sit with Min A with use of bed features. PT Short Term Goal 2 (Week 1): Pt will be able to transfer sit<>stand with Min A. PT Short Term Goal 3 (Week 1): Pt will be able to perform stand pivot tranfsfers with Mod A. PT Short Term Goal 4 (Week 1): Pt will be able to tolerate sitting at EOB with Min A and able to correct LOB & posture.  Skilled Therapeutic Interventions/Progress Updates:    Pt received sitting in chair, daughter present, & agreeable to PT. PT assisted pt with donning shirt, total A for threading LUE but pt able to pull shirt over head with Min A. Pt leaned forward in w/c to completely don shirt; pt required increased time to lean forward due to decreased core strength. PT then threaded L pant leg total A & pt able to assist with RLE. Pt performed sit>stand with Max A to allow SLP to completely don pants. PT assisted pt with placing LLE on Stedy & pt able to pull to standing with RUE & Mod/Max A from PT. Pt required maximum verbal & tactile cues to achieve full upright standing, and maximum cuing to sit on seat of Stedy. Pt tended to stay standing and push to L, but when cued pt able to sit. SLP provided visual & verbal cuing to allow pt to shift to & maintain midline. Pt transferred to sitting EOB where he demonstrated anterior-lateral lean to L; SLP provided tactile, visual & verbal cuing for pt to achieve midline. Pt able to support himself with RUE, leaning to R. Pt then transported bed>w/c using Stedy in same method as noted above, with pt more willing to sit instead of attempting to stand. Pt then transported out into hallway  and transferred to standing utilizing R rail & Max A from PT. PT provided tactile assistance to extend hips & trunk to achieve upright posture instead of flexed posture, and blocked L knee. Pt then ambulated 30 ft + 15 ft in hallway with R rail, w/c follow & Max A. PT provided max A to weight shift, advance L knee, approximate L knee to prevent hyperextension & buckling, & cuing pt to advance RUE on rail. Pt required sitting break between ambulation trials but tolerated task well. At end of session pt was left in room in w/c with QRB in place & daughter present to supervise.   Therapy Documentation Precautions:  Precautions Precautions: Fall Restrictions Weight Bearing Restrictions: No   Pain: Pain Assessment Pain Assessment: No/denies pain   See Function Navigator for Current Functional Status.   Therapy/Group: Individual Therapy  Sandi Mariscal 10/14/2015, 11:39 AM

## 2015-10-14 NOTE — Progress Notes (Signed)
Social Work Patient ID: Roger [name], male   DOB: 01/22/1931, 82 y.o.   MRN: [id number] Met with daughter to discuss team conference goals-min assist wheelchair level and target discharge date 3/23. Daughter is here daily observing pt in therapies. She see's how much care pt is and is discussing with her step-mom/pt's wife about hiring assist for pt at discharge. Will see next Wed how pt is doing and discuss the discharge Disposition again, may need to pursue NH until pt at a higher level. Daughter plans to continue to be involved and here daily.

## 2015-10-14 NOTE — Progress Notes (Signed)
Occupational Therapy Session Note  Patient Details  Name: Roger [name] MRN: [id number] Date of Birth: 07/08/1931  Today's Date: 10/14/2015 OT Individual Time: 1884-1660 OT Individual Time Calculation (min): 60 min   Short Term Goals: Week 1:  OT Short Term Goal 1 (Week 1): Pt will complete upper body dressing sitting at EOB with mod assist OT Short Term Goal 2 (Week 1): Pt will complete transfer to [employer] with mod assist OT Short Term Goal 3 (Week 1): Pt will maintian supported static standing balance during assisted lower body dressing at sink independently OT Short Term Goal 4 (Week 1): Pt will bathe 4 of 10 body parts with supervision after setup at sink OT Short Term Goal 5 (Week 1): Pt will complete 2 of 5 grooming task with min vc for thoroughness  Skilled Therapeutic Interventions/Progress Updates: ADL-retraining at sink with focus on grooming, oral care, and upper body bathing skills.   Pt escorted to sink in w/c and setup for upper body bathing only and grooming d/t pt very slow with movement and requiring over all mod-max assist required to maintain dynamic sitting balance, lean forward and weight-shift while reaching for items.  Pt and daughter instructed on adapted bathing skills to inlcude using pillow/cushion to support LUE while washing it and to wipe right hand and palm.   OT provided LH sponge and instructed pt on washing under his arm.    Pt groomed with overall min assist for thoroughness and extra time during this session.   At RN request, session concluded with assisting pt with removal of catheter and pants, standing supported at sink.   Pt then requested immediate assist to toilet.   OT alerted RN to need for assist to toilet as no time remained in session to assist pt.     Therapy Documentation Precautions:  Precautions Precautions: Fall Restrictions Weight Bearing Restrictions: No  Vital Signs: Therapy Vitals Temp: 98.3 F (36.8 C) Temp Source: Oral Pulse Rate:  97 BP: (!) 100/59 mmHg Patient Position (if appropriate): Sitting Oxygen Therapy SpO2: 97 % O2 Device: Not Delivered   Pain: Pain Assessment Pain Assessment: No/denies pain   ADL: ADL ADL Comments: see Functional Assessment Tool  See Function Navigator for Current Functional Status.   Therapy/Group: Individual Therapy   Second session: Time: 1300-1330 Time Calculation (min):  30 min  Pain Assessment: No/denies pain  Skilled Therapeutic Interventions: ADL-retraining with focus on buttoning/unbuttoning shirt using button hook.  Pt received seated in w/c self-feeding lunch using AE (plate guard and dicem mat) with supervision and setup assist from daughter.   OT re-educated pt on use of button hook providing hand-over-hand guidance during demo.   After setup to reposition in chair, pt attempted use of tool on 1 of 3 buttons with partial success.   Needs reinforcement training to develop competence; daughter educated on how to grade activity.   OT also demo'd one-hand method to button and unbutton shirts and pt prefers buttoned shirts over pull-over shirts.      See FIM for current functional status  Therapy/Group: Individual Therapy Antwone Capozzoli 10/14/2015, 2:41 PM

## 2015-10-14 NOTE — Progress Notes (Signed)
Patient refusing CPAP for tonight. 

## 2015-10-15 ENCOUNTER — Inpatient Hospital Stay (HOSPITAL_COMMUNITY): Payer: Medicare Other | Admitting: Speech Pathology

## 2015-10-15 ENCOUNTER — Inpatient Hospital Stay (HOSPITAL_COMMUNITY): Payer: Medicare Other | Admitting: Physical Therapy

## 2015-10-15 ENCOUNTER — Inpatient Hospital Stay (HOSPITAL_COMMUNITY): Payer: Medicare Other

## 2015-10-15 LAB — BASIC METABOLIC PANEL
Anion gap: 10 (ref 5–15)
BUN: 25 mg/dL — ABNORMAL HIGH (ref 6–20)
CALCIUM: 9.3 mg/dL (ref 8.9–10.3)
CO2: 29 mmol/L (ref 22–32)
CREATININE: 1.14 mg/dL (ref 0.61–1.24)
Chloride: 106 mmol/L (ref 101–111)
GFR, EST NON AFRICAN AMERICAN: 58 mL/min — AB (ref >60)
Glucose, Bld: 122 mg/dL — ABNORMAL HIGH (ref 65–99)
Potassium: 4 mmol/L (ref 3.5–5.1)
SODIUM: 145 mmol/L (ref 135–145)

## 2015-10-15 LAB — GLUCOSE, CAPILLARY
GLUCOSE-CAPILLARY: 181 mg/dL — AB (ref 65–99)
Glucose-Capillary: 110 mg/dL — ABNORMAL HIGH (ref 65–99)
Glucose-Capillary: 119 mg/dL — ABNORMAL HIGH (ref 65–99)
Glucose-Capillary: 191 mg/dL — ABNORMAL HIGH (ref 65–99)

## 2015-10-15 LAB — CBC WITH DIFFERENTIAL/PLATELET
BASOS PCT: 0 %
Basophils Absolute: 0 10*3/uL (ref 0.0–0.1)
EOS ABS: 0.2 10*3/uL (ref 0.0–0.7)
EOS PCT: 3 %
HCT: 38.2 % — ABNORMAL LOW (ref 39.0–52.0)
Hemoglobin: 11.9 g/dL — ABNORMAL LOW (ref 13.0–17.0)
Lymphocytes Relative: 14 %
Lymphs Abs: 1.1 10*3/uL (ref 0.7–4.0)
MCH: 28.1 pg (ref 26.0–34.0)
MCHC: 31.2 g/dL (ref 30.0–36.0)
MCV: 90.3 fL (ref 78.0–100.0)
MONO ABS: 0.7 10*3/uL (ref 0.1–1.0)
MONOS PCT: 9 %
Neutro Abs: 5.6 10*3/uL (ref 1.7–7.7)
Neutrophils Relative %: 74 %
PLATELETS: 167 10*3/uL (ref 150–400)
RBC: 4.23 MIL/uL (ref 4.22–5.81)
RDW: 14.9 % (ref 11.5–15.5)
WBC: 7.6 10*3/uL (ref 4.0–10.5)

## 2015-10-15 MED ORDER — METFORMIN HCL ER 500 MG PO TB24
500.0000 mg | ORAL_TABLET | Freq: Every day | ORAL | Status: DC
  Administered 2015-10-15 – 2015-10-16 (×2): 500 mg via ORAL
  Filled 2015-10-15 (×3): qty 1

## 2015-10-15 MED ORDER — TIZANIDINE HCL 4 MG PO TABS
4.0000 mg | ORAL_TABLET | Freq: Three times a day (TID) | ORAL | Status: DC
  Administered 2015-10-15 – 2015-10-18 (×9): 4 mg via ORAL
  Filled 2015-10-15 (×9): qty 1

## 2015-10-15 NOTE — Progress Notes (Signed)
Patient placed on CPAP for HS with assistance from family member.  Patient's home equipment used.

## 2015-10-15 NOTE — Progress Notes (Signed)
[facility] PHYSICAL MEDICINE & REHABILITATION     PROGRESS NOTE  Subjective/Complaints:  Patient seen sleeping in bed this morning. His daughter is not at bedside today. Per nursing, patient slept well overnight.  ROS: Denies CP, SOB, n/v/d.   Objective: Vital Signs: Blood pressure 153/61, pulse 70, temperature 97.7 F (36.5 C), temperature source Oral, resp. rate 18, height  (1.753 m), weight 61.916 kg (136 lb 8 oz), SpO2 98 %. No results found.  Recent Labs  10/15/15 0554  WBC 7.6  HGB 11.9*  HCT 38.2*  PLT 167    Recent Labs  10/15/15 0554  NA 145  K 4.0  CL 106  GLUCOSE 122*  BUN 25*  CREATININE 1.14  CALCIUM 9.3   CBG (last 3)   Recent Labs  10/14/15 1614 10/14/15 2124 10/15/15 0703  GLUCAP 195* 200* 110*    Wt Readings from Last 3 Encounters:  10/13/15 61.916 kg (136 lb 8 oz)  10/07/15 66.2 kg (145 lb 15.1 oz)  02/20/14 63.957 kg (141 lb)    Physical Exam:  BP 153/61 mmHg  Pulse 70  Temp(Src) 97.7 F (36.5 C) (Oral)  Resp 18  Ht  (1.753 m)  Wt 61.916 kg (136 lb 8 oz)  BMI 20.15 kg/m2  SpO2 98% HENT: Normocephalic, Atraumatic.  Eyes: EOM and Conj are normal.  Cardiovascular: Normal rate and regular rhythm. Grade 3 systolic ejection murmur Respiratory: Effort normal and breath sounds normal. No respiratory distress.  GI: Soft. Bowel sounds are normal. He exhibits no distension.  Neurological:  Patient is alert and oriented x2 with increased time and cues.   Moderate dysarthria.  He will follow simple verbal commands.  Left facial droop  Motor: LUE: 1/5 elbow flexion/extension, 0/5 distally mAS  1/4 shoulder abduction, 2/4 elbow flexion, 1/4 wrist flexion, 1+/4 ankle dorsiflexion LLE: hip flexion 1/5, distally 0/5 RUE: 5/5 proximal to distal LLE: 4+/5 proximal to distal Skin: Skin is warm and dry. Grade 1 sacral decubitus, hips and heels without evidence of breakdown  Assessment/Plan: 1. 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 Physiatrist is providing close team supervision and 24 hour management of active medical problems listed below. Physiatrist and rehab team continue to assess barriers to discharge/monitor patient progress toward functional and medical goals.  Function:  Bathing Bathing position   Position: Wheelchair/chair at sink  Bathing parts Body parts bathed by patient: Right arm, Left arm, Chest, Abdomen Body parts bathed by helper: Back  Bathing assist Assist Level: Touching or steadying assistance(Pt > 75%)      Upper Body Dressing/Undressing Upper body dressing   What is the patient wearing?: Pull over shirt/dress     Pull over shirt/dress - Perfomed by patient: Put head through opening Pull over shirt/dress - Perfomed by helper: Thread/unthread right sleeve, Thread/unthread left sleeve, Pull shirt over trunk        Upper body assist Assist Level: Touching or steadying assistance(Pt > 75%)      Lower Body Dressing/Undressing Lower body dressing   What is the patient wearing?: Pants, Ted Hose, Shoes (brief)   Underwear - Performed by helper: Thread/unthread left underwear leg, Pull underwear up/down, Thread/unthread right underwear leg Pants- Performed by patient: Thread/unthread right pants leg Pants- Performed by helper: Thread/unthread right pants leg, Thread/unthread left pants leg, Pull pants up/down, Fasten/unfasten pants   Non-skid slipper socks- Performed by helper:  Don/doff right sock, Don/doff left sock     Shoes - Performed by patient: Fasten right, Fasten left, Don/doff right shoe Shoes - Performed by helper: Don/doff left shoe       TED Hose - Performed by helper: Don/doff right TED hose, Don/doff left TED hose  Lower body assist Assist for lower body dressing: 2 Helpers (sit to stand)      Ambulance person activity did not occur: No continent bowel/bladder event Toileting steps completed by patient: Adjust clothing prior to toileting Toileting steps completed by helper: Adjust clothing prior to toileting, Performs perineal hygiene, Adjust clothing after toileting Toileting Assistive Devices: Toilet aid  Toileting assist Assist level: Two helpers   Transfers Chair/bed transfer   Chair/bed transfer method: Other Chair/bed transfer assist level: Total assist (Pt < 25%) Chair/bed transfer assistive device: Other (steady lift)     Locomotion Ambulation Ambulation activity did not occur: Safety/medical concerns   Max distance: 30 Assist level: Maximal assist (Pt 25 - 49%)   Wheelchair Wheelchair activity did not occur: Safety/medical concerns Type: Manual Max wheelchair distance: 100 Assist Level: Touching or steadying assistance (Pt > 75%), Moderate assistance (Pt 50 - 74%)  Cognition Comprehension Comprehension assist level: Understands basic 75 - 89% of the time/ requires cueing 10 - 24% of the time  Expression Expression assist level: Expresses basic 25 - 49% of the time/requires cueing 50 - 75% of the time. Uses single words/gestures.  Social Interaction Social Interaction assist level: Interacts appropriately 75 - 89% of the time - Needs redirection for appropriate language or to initiate interaction.  Problem Solving Problem solving assist level: Solves basic 25 - 49% of the time - needs direction more than half the time to initiate, plan or complete simple activities  Memory Memory assist level: Recognizes or recalls 25 - 49% of the time/requires cueing 50 - 75% of the time     Medical Problem List and Plan: 1. 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 DVT Prophylaxis/Anticoagulation: Subcutaneous heparin. Monitor platelet count  and any signs of bleeding 3. Pain Management: Tylenol as needed 4. Mood/dementia: Discuss cognitive baseline with family. Resumed Namenda 5. Neuropsych: This patient is not capable of making decisions on his own behalf. 6. Skin/Wound Care: Routine skin checks 7. Fluids/Electrolytes/Nutrition: Routine I&O's   Dysphagia 1, Nectar thick 8. Hypertension.   Patient on Cozaar 100 mg daily prior to admission.   Cozaar 50mg  restarted on 3/5, increased to 100 mg on 3/8, will cont to monitor and consider increase if BPs persistently elevated  9. Diabetes mellitus of peripheral neuropathy. Hemoglobin A1c 8.1. Check blood sugars before meals and at bedtime.   Patient on Glucophage ex RR 500 mg twice daily prior to admission - Will start  Glucophage 500 XR daily and increase to home dose if necessary  Amaryl increased to 2mg  on 3/5  Will monitor Cr and consider restarting glucophage  Fasting CBG 110 AM  10. Hyperlipidemia. Lipitor 11. BPH. Resumed Proscar 5 mg daily 12. History of gout.Colchicine. Monitor for any gout flareup 13. AKI: Improving  Cr.  1.14 on 3/10 14. Spasticity  Cont zanaflex 2mg  TID, increased to 4 mg TID on 3/10   Splints ordered  LOS (Days) 8 A FACE TO FACE EVALUATION WAS PERFORMED  Ankit Karis Juba 10/15/2015 8:45 AM

## 2015-10-15 NOTE — Progress Notes (Signed)
Physical Therapy Weekly Progress Note  Patient Details  Name: Roger [name] MRN: [id number] Date of Birth: 02/27/1931  Beginning of progress report period: October 08, 2015 End of progress report period: October 15, 2015  Today's Date: 10/15/2015 PT Individual Time:1100-1200 [id number] PT Individual Time Calculation (min): 60 min & 30 min   Patient has met 0 of 4 short term goals.   Patient continues to demonstrate the following deficits: decreased core strength, decrease LUE/LLE strength, impaired balance, L inattention, decreased activity tolerance and therefore will continue to benefit from skilled PT intervention to enhance overall performance with activity tolerance, balance, postural control, ability to compensate for deficits, functional use of  left lower extremity and attention.  Patient progressing toward long term goals.  Continue plan of care.  PT Short Term Goals Week 1:  PT Short Term Goal 1 (Week 1): Pt will be able to transfer supine<>sit with Min A with use of bed features. PT Short Term Goal 1 - Progress (Week 1): Revised due to lack of progress PT Short Term Goal 2 (Week 1): Pt will be able to transfer sit<>stand with Min A. PT Short Term Goal 2 - Progress (Week 1): Progressing toward goal PT Short Term Goal 3 (Week 1): Pt will be able to perform stand pivot tranfsfers with Mod A. PT Short Term Goal 3 - Progress (Week 1): Progressing toward goal PT Short Term Goal 4 (Week 1): Pt will be able to tolerate sitting at EOB with Min A and able to correct LOB & posture. PT Short Term Goal 4 - Progress (Week 1): Progressing toward goal  Skilled Therapeutic Interventions/Progress Updates:    Treatment 1: Pt received with OT & pt handed over to care of PT. PT assisted pt with donning pants, and pt performed sit<>stand transfer with Max A and max cuing for anterior shift for task, to allow complete donning of pants. PT assisted pt with donning shirt & pt leaned forward to pull shirt over  back. Pt then transported out in hallway, where he ambulated 30 ft + 15 ft + 30 ft with hall rail & w/c follow. Pt required Max tactile cuing to assist with anterior pelvic tilt and cuing at shoulder/chest to achieve more upright standing posture. PT helped advance LLE & provided approximation at L knee to prevent hyperextension & buckling. PT provided assistance to weight shift at hips to allow pt to advance LE's.  Pt would demonstrate more forward flexion with fatigue but when given object to look up at pt with slightly improved posture. Pt then negotiated 4 steps x 2 trials with Max A/+2 A. Pt utilized R railing and required assistance to advance LLE, approximate L knee to prevent buckling/hyperextension & facilitate more upright posture. Pt also provided with smaller w/c for better fit & increased comfort. At end of session pt left in room with all needs within reach and daughter present to supervise.  Treatment 2: Pt received in w/c with c/o buttock pain; pt performed sit<>stand x 2 trials with Max A and cuing for proper hand placement for pressure relief. Pt transported to gym and utilized dynavision with RUE. Activity focused on facilitating anterior shift, forward lean, reaching outside of base of support and coming back to midline. Pt required maximum verbal cuing to come back to  Midline after reaching to L. Pt with slower reaction time to objects in L lower quadrant. Pt then transported back to room and performed squat pivot w/c>bed with Max A. Pt required maximum tactile &  verbal cuing for weight shift to allow pt to scoot entirely onto bed. Pt transferred sit>supine with max A for eccentric control and to raise BLE onto bed, as pt unable even when hooking LLE with RLE. Pt required total A to scoot to head of bed. At end of session pt left supine in bed with all needs met & daughter present to supervise.   Therapy Documentation Precautions:  Precautions Precautions: Fall Restrictions Weight  Bearing Restrictions: No  Pain: Pain Assessment Pain Assessment: No/denies pain   See Function Navigator for Current Functional Status.  Therapy/Group: Individual Therapy  Waunita Schooner 10/15/2015, 4:13 PM

## 2015-10-15 NOTE — Progress Notes (Signed)
Speech Language Pathology Daily Session Note  Patient Details  Name: Roger [name] MRN: [id number] Date of Birth: March 07, 1931  Today's Date: 10/15/2015 SLP Individual Time: 1445-1530 SLP Individual Time Calculation (min): 45 min  Short Term Goals: Week 1: SLP Short Term Goal 1 (Week 1): Patient will consume current diet with minimal overt s/s of aspiration with Mod A verbal cues for use of swallowing compensatory strategies.  SLP Short Term Goal 2 (Week 1): Patient will consume trials of ice chips and thin liquids via tsp with minimal overt s/s of aspiration in less than 30% of trials to assess readniess for repeat MBS. SLP Short Term Goal 3 (Week 1): Patient will demonstrate efficient mastication of Dys. 2 textures over 3 consecutive sessions with minimal oral residue and no overt s/s of aspiration with Mod A verbal cues prior to upgrade.  SLP Short Term Goal 4 (Week 1): Patient will utilize an increased vocal intensity and over-articulation at the word and phrase level with Mod A verbal cues to maximize intelligibility to 75%.  SLP Short Term Goal 5 (Week 1): Patient will attend to left field of enviornment during functional tasks with Min A verbal cues.  SLP Short Term Goal 6 (Week 1): Patient will demonstrate sustained attention to functional tasks for 10 minutes with Min A verbal cues for redirection.   Skilled Therapeutic Interventions: Skilled ST focused on selective attention, attention to left, memory and speech intelligibility. Pt required mod verbal cues for attention to left and recall during functional activity, min verbal cues for selective attention and speech intelligibility at the phrase. Daughter present. Pt left with all needs within reach. Continue current plan of care.      Cognition Comprehension Comprehension assist level: Understands basic 50 - 74% of the time/ requires cueing 25 - 49% of the time  Expression   Expression assist level: Expresses basic 50 - 74% of the  time/requires cueing 25 - 49% of the time. Needs to repeat parts of sentences.  Social Interaction Social Interaction assist level: Interacts appropriately 50 - 74% of the time - May be physically or verbally inappropriate.  Problem Solving Problem solving assist level: Solves basic 25 - 49% of the time - needs direction more than half the time to initiate, plan or complete simple activities  Memory Memory assist level: Recognizes or recalls 25 - 49% of the time/requires cueing 50 - 75% of the time    Pain Pain Assessment Pain Assessment: No/denies pain  Therapy/Group: Individual Therapy  Roger [name] 10/15/2015, 5:15 PM

## 2015-10-15 NOTE — Progress Notes (Signed)
Occupational Therapy Weekly Progress Note  Patient Details  Name: Roger [name] MRN: [id number] Date of Birth: [birth year]  Beginning of progress report period: October 08, 2015 End of progress report period: October 15, 2015  Today's Date: 10/15/2015 OT Individual Time: 0945-1100 OT Individual Time Calculation (min): 75 min    Patient has met 2 of 5 short term goals.  Pt partly met 2 other goals and is progressing on final goal relating due to improved sitting balance.   Pt requires significant assist to maintain static standing balance sufficient to allow assist with lower body dressing skills.  Patient continues to demonstrate the following deficits: Impaired activity tolerance, left hemiplegia, left inattention, impaired functional transfers, and impaired balance (sitting and standing) and therefore will continue to benefit from skilled OT intervention to enhance overall performance with Reduce care partner burden.  Patient progressing toward long term goals..  Continue plan of care.  OT Short Term Goals Week 1:  OT Short Term Goal 1 (Week 1): Pt will complete upper body dressing sitting at EOB with mod assist OT Short Term Goal 1 - Progress (Week 1): Partly met OT Short Term Goal 2 (Week 1): Pt will complete transfer to [facility] with mod assist OT Short Term Goal 2 - Progress (Week 1): Progressing toward goal OT Short Term Goal 3 (Week 1): Pt will maintian supported static standing balance during assisted lower body dressing at sink independently OT Short Term Goal 3 - Progress (Week 1): Partly met OT Short Term Goal 4 (Week 1): Pt will bathe 4 of 10 body parts with supervision after setup at sink OT Short Term Goal 4 - Progress (Week 1): Met OT Short Term Goal 5 (Week 1): Pt will complete 2 of 5 grooming task with min vc for thoroughness OT Short Term Goal 5 - Progress (Week 1): Met Week 2:  OT Short Term Goal 1 (Week 2): Pt will demo ability to completed self-feeding with only setup to open  containers OT Short Term Goal 2 (Week 2): Pt will don pull over shirt sitting supported with min assist to manage left UE OT Short Term Goal 2 - Progress (Week 2): Met OT Short Term Goal 3 (Week 2): Pt will complete sit<>stand with mod assist in prep for lower body dressing OT Short Term Goal 4 (Week 2): Pt will lace right pant leg with steadying assist to maintain sitting balance OT Short Term Goal 5 (Week 2): Pt will complete 4 of 5 grooming tasks with setup and supervision  Skilled Therapeutic Interventions/Progress Updates: ADL-retraining with focus on improved bed mobility, transfers, weight-shifting, dynamic sitting balance, and improved awareness of left UE during BADL.   Pt and daughter re-educated on bed mobility using Bobath method to roll to the right.   With hand-over hand guidance pt was able to roll to his right and rise to sit at EOB with overall mod assist and max vc to initiate and follow cues.   Pt's presented with significant delay in processing instructions and fatigued faster during this session.   Pt's daughter stated that pt requested sleep aid the previous night and was exhibiting residual effect of drowsiness.   Pt was able to progress to transfer to w/c and continued NMR of LUE while grooming and bathing at sink in w/c.   Pt requires repeated direction to attend to left and position LUE during all tasks.   After assisted bathing upper body and removing brief and condom cath on lower body, pt requested assist  with transfer to toilet to pass BM.   Pt required overall max assist for transfer although contributing > 60% to lift and >80% to lower.   Pt required total assist for hygiene while he leaned forward in semi-squat position over right LE as therapist cleaned him.   Pt assisted with donning brief and was left in w/c as physical therapist arrived for next therapy session.     Therapy Documentation Precautions:  Precautions Precautions: Fall Restrictions Weight Bearing  Restrictions: No  Pain: No/ Denies pain   ADL: ADL ADL Comments: see Functional Assessment Tool  See Function Navigator for Current Functional Status.   Therapy/Group: Individual Therapy  [place] 10/15/2015, 12:50 PM

## 2015-10-15 NOTE — Progress Notes (Signed)
Speech Language Pathology Weekly Progress Note  Patient Details  Name: Camren Lipsett MRN: [id number] Date of Birth: 1931-03-18  Beginning of progress report period: October 08, 2015 End of progress report period: October 15, 2015   Short Term Goals: Week 1: SLP Short Term Goal 1 (Week 1): Patient will consume current diet with minimal overt s/s of aspiration with Mod A verbal cues for use of swallowing compensatory strategies.  SLP Short Term Goal 1 - Progress (Week 1): Met SLP Short Term Goal 2 (Week 1): Patient will consume trials of ice chips and thin liquids via tsp with minimal overt s/s of aspiration in less than 30% of trials to assess readniess for repeat MBS. SLP Short Term Goal 2 - Progress (Week 1): Met SLP Short Term Goal 3 (Week 1): Patient will demonstrate efficient mastication of Dys. 2 textures over 3 consecutive sessions with minimal oral residue and no overt s/s of aspiration with Mod A verbal cues prior to upgrade.  SLP Short Term Goal 3 - Progress (Week 1): Not met SLP Short Term Goal 4 (Week 1): Patient will utilize an increased vocal intensity and over-articulation at the word and phrase level with Mod A verbal cues to maximize intelligibility to 75%.  SLP Short Term Goal 4 - Progress (Week 1): Not met SLP Short Term Goal 5 (Week 1): Patient will attend to left field of enviornment during functional tasks with Min A verbal cues.  SLP Short Term Goal 5 - Progress (Week 1): Not met SLP Short Term Goal 6 (Week 1): Patient will demonstrate sustained attention to functional tasks for 10 minutes with Min A verbal cues for redirection.  SLP Short Term Goal 6 - Progress (Week 1): Not met    New Short Term Goals: Week 2: SLP Short Term Goal 1 (Week 2): Patient will consume current diet with minimal overt s/s of aspiration with Min A verbal cues for use of swallowing compensatory strategies.  SLP Short Term Goal 2 (Week 2): Patient will consume trials of thin liquids via tsp with  minimal overt s/s of aspiration in less than 30% of trials to assess readniess for repeat MBS with Min A verbal cues for use of swallow strategies . SLP Short Term Goal 3 (Week 2): Patient will demonstrate efficient mastication of Dys. 2 textures over 3 consecutive sessions with minimal oral residue and no overt s/s of aspiration with Mod A verbal cues prior to upgrade.  SLP Short Term Goal 4 (Week 2): Patient will utilize an increased vocal intensity and over-articulation at the word and phrase level with Mod A verbal cues to maximize intelligibility to 75%.  SLP Short Term Goal 5 (Week 2): Patient will demonstrate sustained attention to functional tasks for 10 minutes with Min A verbal cues for redirection.  SLP Short Term Goal 6 (Week 2): Patient will attend to left field of enviornment during functional tasks with Min A verbal cues.   Weekly Progress Updates: Patient has made functional and inconsistent gains and has met 2 of 6 STG's this reporting period due to improved swallowing function. Currently, patient is consuming Dys. 1 textures with nectar-thick liquids with minimal overt s/s of aspiration and requires Mod A verbal cues for use of swallowing compensatory strategies. Patient is also consuming trials of ice chips with minimal overt s/s of aspiration. Recommend continued trials with SLP only with possible readiness for repeat MBS next week. Patient continues to require Mod-Max A verbal cues for use of speech intelligibility strategies at  the phrase level and Max A verbal cues for sustained attention, attention to left field of environment, functional problem solving, recall of new information and emergent awareness of errors. Patient and family education is ongoing. Patient would benefit from continued skilled SLP intervention to maximize cognitive and swallowing function and speech intelligibility in order to maximize overall functional independence prior to discharge.     Intensity: Minumum  of 1-2 x/day, 30 to 90 minutes Frequency: 3 to 5 out of 7 days Duration/Length of Stay: 10/28/15 Treatment/Interventions: Cognitive remediation/compensation;Cueing hierarchy;Dysphagia/aspiration precaution training;Functional tasks;Patient/family education;Therapeutic Activities;Internal/external aids;Environmental controls;Speech/Language facilitation   Delenn Ahn 10/15/2015, 5:29 PM

## 2015-10-15 NOTE — Progress Notes (Signed)
Per daughter and OT pt needed extra time and repetition with cues following commands this morning. Pt started Trazodone 50mg  scheduled on 3-8 QHS. Pt did sleep well throughout night but per pt he had nightmares. RN notified Deatra Ina, PA with findings. Medication d/c per order. Daughter aware and agreed to no longer taking medication.  Will continue to monitor pt.

## 2015-10-16 ENCOUNTER — Inpatient Hospital Stay (HOSPITAL_COMMUNITY): Payer: Medicare Other | Admitting: Occupational Therapy

## 2015-10-16 LAB — GLUCOSE, CAPILLARY
GLUCOSE-CAPILLARY: 171 mg/dL — AB (ref 65–99)
GLUCOSE-CAPILLARY: 163 mg/dL — AB (ref 65–99)
Glucose-Capillary: 106 mg/dL — ABNORMAL HIGH (ref 65–99)
Glucose-Capillary: 170 mg/dL — ABNORMAL HIGH (ref 65–99)

## 2015-10-16 MED ORDER — DOCUSATE SODIUM 50 MG/5ML PO LIQD
200.0000 mg | Freq: Every day | ORAL | Status: DC
  Administered 2015-10-18 – 2015-10-26 (×9): 200 mg via ORAL
  Filled 2015-10-16 (×11): qty 20

## 2015-10-16 NOTE — Plan of Care (Signed)
Problem: RH BLADDER ELIMINATION Goal: RH STG MANAGE BLADDER WITH ASSISTANCE STG Manage Bladder With mod Assistance  Outcome: Not Progressing Condom cath at hs due to incontinence

## 2015-10-16 NOTE — Progress Notes (Signed)
Occupational Therapy Session Note  Patient Details  Name: Roger [name] MRN: [id number] Date of Birth: 1930-08-18  Today's Date: 10/16/2015 OT Individual Time: 1700-1749 OT Individual Time Calculation (min): 30 min   Short Term Goals: Week 1:  OT Short Term Goal 1 (Week 1): Pt will complete upper body dressing sitting at EOB with mod assist OT Short Term Goal 1 - Progress (Week 1): Partly met OT Short Term Goal 2 (Week 1): Pt will complete transfer to [facility] with mod assist OT Short Term Goal 2 - Progress (Week 1): Progressing toward goal OT Short Term Goal 3 (Week 1): Pt will maintian supported static standing balance during assisted lower body dressing at sink independently OT Short Term Goal 3 - Progress (Week 1): Partly met OT Short Term Goal 4 (Week 1): Pt will bathe 4 of 10 body parts with supervision after setup at sink OT Short Term Goal 4 - Progress (Week 1): Met OT Short Term Goal 5 (Week 1): Pt will complete 2 of 5 grooming task with min vc for thoroughness OT Short Term Goal 5 - Progress (Week 1): Met   Week 2:  OT Short Term Goal 1 (Week 2): Pt will demo ability to completed self-feeding with only setup to open containers OT Short Term Goal 2 (Week 2): Pt will don pull over shirt sitting supported with min assist to manage left UE OT Short Term Goal 2 - Progress (Week 2): Met OT Short Term Goal 3 (Week 2): Pt will complete sit<>stand with mod assist in prep for lower body dressing OT Short Term Goal 4 (Week 2): Pt will lace right pant leg with steadying assist to maintain sitting balance OT Short Term Goal 5 (Week 2): Pt will complete 4 of 5 grooming tasks with setup and supervision  Skilled Therapeutic Interventions/Progress Updates:  Pt found supine in bed with daughter present. Pt with lunch tray present in room and getting ready to eat. Pt with no complaints of pain. Engaged patient in bed mobility, pt max assist for BLEs and trunk support to sit EOB. Pt sat EOB for  self-feeding. Pt with significant left lateral lean in sitting and required max multimodal cueing to correct this and sit more at midline. Pt required max cueing for two swallows after every bite, daughter providing cueing to patient as well as therapist. After ~20 minutes of eating sitting EOB, pt transferred EOB to w/c with max assist (+2 present for safety). At end of session, left pt seated in w/c with daughter present and lunch tray - notified NT of this.                                       Therapy Documentation Precautions:  Precautions Precautions: Fall Restrictions Weight Bearing Restrictions: No  Vital Signs: Therapy Vitals Temp: 98.3 F (36.8 C) Temp Source: Oral Pulse Rate: 73 Resp: 17 BP: (!) 158/68 mmHg Patient Position (if appropriate): Lying Oxygen Therapy SpO2: 98 % O2 Device: Not Delivered  ADL: ADL ADL Comments: see Functional Assessment Tool  See Function Navigator for Current Functional Status.  Therapy/Group: Individual Therapy  [name] , MS, OTR/L, CLT  10/16/2015, 1:26 PM

## 2015-10-16 NOTE — Progress Notes (Signed)
[place] PHYSICAL MEDICINE & REHABILITATION     PROGRESS NOTE  Subjective/Complaints:  Patient seen this morning sitting up in his bed. He is not wearing his CPAP, he states that he wore it for most of the night but took it off a little earlier.  ROS: Denies CP, SOB, n/v/d.   Objective: Vital Signs: Blood pressure 158/68, pulse 73, temperature 98.3 F (36.8 C), temperature source Oral, resp. rate 17, height  (1.753 m), weight 61.916 kg (136 lb 8 oz), SpO2 98 %. No results found.  Recent Labs  10/15/15 0554  WBC 7.6  HGB 11.9*  HCT 38.2*  PLT 167    Recent Labs  10/15/15 0554  NA 145  K 4.0  CL 106  GLUCOSE 122*  BUN 25*  CREATININE 1.14  CALCIUM 9.3   CBG (last 3)   Recent Labs  10/15/15 1635 10/15/15 2037 10/16/15 0636  GLUCAP 119* 191* 106*    Wt Readings from Last 3 Encounters:  10/13/15 61.916 kg (136 lb 8 oz)  10/07/15 66.2 kg (145 lb 15.1 oz)  02/20/14 63.957 kg (141 lb)    Physical Exam:  BP 158/68 mmHg  Pulse 73  Temp(Src) 98.3 F (36.8 C) (Oral)  Resp 17  Ht  (1.753 m)  Wt 61.916 kg (136 lb 8 oz)  BMI 20.15 kg/m2  SpO2 98% HENT: Normocephalic, Atraumatic.  Eyes: EOM and Conj are normal.  Cardiovascular: Normal rate and regular rhythm. Grade 3 systolic ejection murmur Respiratory: Effort normal and breath sounds normal. No respiratory distress.  GI: Soft. Bowel sounds are normal. He exhibits no distension.  Neurological:  Patient is alert and oriented x2 with increased time and cues.   Moderate dysarthria.  He will follow simple verbal commands.  Left facial droop  Motor: LUE: 1/5 elbow flexion/extension, 0/5 distally mAS  1/4 shoulder abduction, 2/4 elbow flexion (improving), 1/4 wrist flexion, 1+/4 ankle dorsiflexion LLE: hip flexion 1/5, distally 0/5 RUE: 5/5 proximal to distal LLE: 4+/5 proximal to distal Skin: Skin is warm and dry. Grade 1 sacral decubitus, hips and heels without evidence of  breakdown  Assessment/Plan: 1. 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 Physiatrist is providing close team supervision and 24 hour management of active medical problems listed below. Physiatrist and rehab team continue to assess barriers to discharge/monitor patient progress toward functional and medical goals.  Function:  Bathing Bathing position   Position: Wheelchair/chair at sink  Bathing parts Body parts bathed by patient: Right arm, Left arm, Chest, Abdomen, Front perineal area Body parts bathed by helper: Back, Buttocks  Bathing assist Assist Level: More than reasonable time, Set up, Supervision or verbal cues, Touching or steadying assistance(Pt > 75%)   Set up : To obtain items  Upper Body Dressing/Undressing Upper body dressing   What is the patient wearing?: Pull over shirt/dress     Pull over shirt/dress - Perfomed by patient: Put head through opening Pull over shirt/dress - Perfomed by helper: Thread/unthread right sleeve, Thread/unthread left sleeve, Pull shirt over trunk        Upper body assist Assist Level: Touching or steadying assistance(Pt > 75%)      Lower Body Dressing/Undressing Lower body dressing   What is the patient wearing?: Pants, Ted Hose, Shoes (brief)   Underwear - Performed by helper: Thread/unthread left underwear leg, Pull underwear up/down, Thread/unthread right underwear leg Pants-  Performed by patient: Thread/unthread right pants leg Pants- Performed by helper: Thread/unthread right pants leg, Thread/unthread left pants leg, Pull pants up/down, Fasten/unfasten pants   Non-skid slipper socks- Performed by helper: Don/doff right sock, Don/doff left sock     Shoes - Performed by patient: Fasten right, Fasten left, Don/doff right shoe Shoes - Performed by helper: Don/doff left shoe        TED Hose - Performed by helper: Don/doff right TED hose, Don/doff left TED hose  Lower body assist Assist for lower body dressing: 2 Helpers (sit to stand)      Financial trader activity did not occur: No continent bowel/bladder event Toileting steps completed by patient: Adjust clothing prior to toileting Toileting steps completed by helper: Adjust clothing prior to toileting, Performs perineal hygiene, Adjust clothing after toileting Toileting Assistive Devices: Toilet aid  Toileting assist Assist level: Touching or steadying assistance (Pt.75%)   Transfers Chair/bed transfer   Chair/bed transfer method: Squat pivot Chair/bed transfer assist level: Maximal assist (Pt 25 - 49%/lift and lower) Chair/bed transfer assistive device: Armrests     Locomotion Ambulation Ambulation activity did not occur: Safety/medical concerns   Max distance: 30 Assist level: Maximal assist (Pt 25 - 49%)   Wheelchair Wheelchair activity did not occur: Safety/medical concerns Type: Manual Max wheelchair distance: 100 Assist Level: Touching or steadying assistance (Pt > 75%), Moderate assistance (Pt 50 - 74%)  Cognition Comprehension Comprehension assist level: Understands basic 50 - 74% of the time/ requires cueing 25 - 49% of the time  Expression Expression assist level: Expresses basic 50 - 74% of the time/requires cueing 25 - 49% of the time. Needs to repeat parts of sentences.  Social Interaction Social Interaction assist level: Interacts appropriately 50 - 74% of the time - May be physically or verbally inappropriate.  Problem Solving Problem solving assist level: Solves basic 25 - 49% of the time - needs direction more than half the time to initiate, plan or complete simple activities  Memory Memory assist level: Recognizes or recalls 25 - 49% of the time/requires cueing 50 - 75% of the time     Medical Problem List and Plan: 1. 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 DVT Prophylaxis/Anticoagulation: Subcutaneous heparin. Monitor platelet count and any signs of bleeding 3. Pain Management: Tylenol as needed 4. Mood/dementia: Discuss cognitive baseline with family. Resumed Namenda 5. Neuropsych: This patient is not capable of making decisions on his own behalf. 6. Skin/Wound Care: Routine skin checks 7. Fluids/Electrolytes/Nutrition: Routine I&O's   Dysphagia 1, Nectar thick 8. Hypertension.   Patient on Cozaar 100 mg daily prior to admission.   Cozaar 50mg  restarted on 3/5, increased to 100 mg on 3/8, will cont to monitor and consider increase if BPs persistently elevated -labile at present 9. Diabetes mellitus of peripheral neuropathy. Hemoglobin A1c 8.1. Check blood sugars before meals and at bedtime.   Patient on Glucophage ex RR 500 mg twice daily prior to admission -   Glucophage 500 XR daily started on 3/10 and will increase to home dose if necessary  Amaryl increased to 2mg  on 3/5  Will monitor Cr and consider restarting glucophage  Fasting CBG 106 AM  10. Hyperlipidemia. Lipitor 11. BPH. Resumed Proscar 5 mg daily 12. History of gout.Colchicine. Monitor for any gout flareup 13. AKI: Improving  Cr.  1.14 on 3/10 14. Spasticity  Cont zanaflex  2mg  TID, increased to 4 mg TID on 3/10   Splints  LOS (Days) 9 A FACE TO FACE EVALUATION WAS PERFORMED  Ankit Karis Juba 10/16/2015 6:59 AM

## 2015-10-16 NOTE — Progress Notes (Signed)
Patient already wearing home CPAP when RT entered the room.  Patient is tolerating at this time. RT will continue to monitor.

## 2015-10-17 ENCOUNTER — Inpatient Hospital Stay (HOSPITAL_COMMUNITY): Payer: Medicare Other | Admitting: Physical Therapy

## 2015-10-17 ENCOUNTER — Inpatient Hospital Stay (HOSPITAL_COMMUNITY): Payer: Medicare Other | Admitting: Occupational Therapy

## 2015-10-17 LAB — GLUCOSE, CAPILLARY
GLUCOSE-CAPILLARY: 167 mg/dL — AB (ref 65–99)
GLUCOSE-CAPILLARY: 133 mg/dL — AB (ref 65–99)
Glucose-Capillary: 131 mg/dL — ABNORMAL HIGH (ref 65–99)
Glucose-Capillary: 198 mg/dL — ABNORMAL HIGH (ref 65–99)

## 2015-10-17 MED ORDER — STARCH (THICKENING) PO POWD
ORAL | Status: DC | PRN
  Filled 2015-10-17 (×3): qty 227

## 2015-10-17 MED ORDER — METFORMIN HCL 500 MG PO TABS
250.0000 mg | ORAL_TABLET | Freq: Two times a day (BID) | ORAL | Status: DC
  Administered 2015-10-17 – 2015-10-27 (×21): 250 mg via ORAL
  Filled 2015-10-17 (×21): qty 1

## 2015-10-17 NOTE — Plan of Care (Signed)
Problem: RH BLADDER ELIMINATION Goal: RH STG MANAGE BLADDER WITH ASSISTANCE STG Manage Bladder With mod Assistance  Outcome: Not Progressing Incontinent; condom cath at hs

## 2015-10-17 NOTE — Progress Notes (Signed)
Physical Therapy Session Note  Patient Details  Name: Roger [name] MRN: [id number] Date of Birth: 1930/12/18  Today's Date: 10/17/2015 PT Individual Time: 9562-1308 PT Individual Time Calculation (min): 46 min   Short Term Goals: Week 2:  PT Short Term Goal 1 (Week 2): Pt will be able to transfer supine<>sit with Mod A with use of bed features. PT Short Term Goal 2 (Week 2): Pt will be able to transfer sit<>stand with Min A. PT Short Term Goal 3 (Week 2): Pt will be able to perform stand pivot tranfsfers with Mod A. PT Short Term Goal 4 (Week 2): Pt will be able to tolerate sitting at EOB with Min A and able to correct LOB & posture.  Skilled Therapeutic Interventions/Progress Updates:    Pt received in w/c, eating lunch with daughter supervising meal, & pt agreeable to PT. Pt transported room>gym via w/c total A. Pt demonstrated improving LLE quad strength and pt performed LLE long arc quads 1 set of 10 reps; 2-/5 MMT strength noted which is improvement from evaluation day. Pt transferred sit>stand from w/c for standing frame sling placement. Pt required cuing to push up with RUE on w/c armrests and PT blocked L knee during task. Pt transferred to standing in standing frame to work on upright posture. PT provided tactile, verbal & visual cuing to shift weight to R, as pt tends to lean to L. Pt able to self-correct hip position and maintain neutral postural alignment for ~1 minute. While standing in frame pt instructed to reach for & sort objects with RUE to help reduce L inattention & challenge pt with cognitive task. Pt very emotional throughout session today with PT & daughter providing support. At end of session pt returned to sitting in w/c and in care of daughter.   Therapy Documentation Precautions:  Precautions Precautions: Fall Restrictions Weight Bearing Restrictions: No  Vital Signs: BP: 121/74 mmHg Patient Position (if appropriate): Standing (in standing frame)  Pain: Pain  Assessment Pain Assessment: No/denies pain   See Function Navigator for Current Functional Status.   Therapy/Group: Individual Therapy  Sandi Mariscal 10/17/2015, 5:06 PM

## 2015-10-17 NOTE — Progress Notes (Signed)
Occupational Therapy Session Note  Patient Details  Name: Roger [name] MRN: [id number] Date of Birth: [birth year]  Today's Date: 10/17/2015 OT Individual Time: 1300-1345 OT Individual Time Calculation (min): 45 min    Short Term Goals: Week 2:  OT Short Term Goal 1 (Week 2): Pt will demo ability to completed self-feeding with only setup to open containers OT Short Term Goal 2 (Week 2): Pt will don pull over shirt sitting supported with min assist to manage left UE OT Short Term Goal 2 - Progress (Week 2): Met OT Short Term Goal 3 (Week 2): Pt will complete sit<>stand with mod assist in prep for lower body dressing OT Short Term Goal 4 (Week 2): Pt will lace right pant leg with steadying assist to maintain sitting balance OT Short Term Goal 5 (Week 2): Pt will complete 4 of 5 grooming tasks with setup and supervision  Skilled Therapeutic Interventions/Progress Updates:    1:1 NMR with focus on normal patterns of movement in left UE and breaking up patterns of tone.  Addressed this in supine in bed. Focused on scapular protraction and retraction in sidelying activated in functional reach with facilitation. Pt did benefit from slight resistance for feedback. Pt required max cuing to visually attend to hand as another way to help activate UE. Pt able to elicit movement in scapula, pec, shoulder elevation/ depression, bicep and tricep.  Addressed pectoral tone and pt able to relax muscle for prolonged stretch in UE abduction. Pt able to come to EOB with max tactile cues for sequence and mod A for facilitation. Pt able to transfer into w/c on his right with mod A (with 4 squats to maintain trunk control and bottom clearance.) Daughter present during session and involved.   Therapy Documentation Precautions:  Precautions Precautions: Fall Restrictions Weight Bearing Restrictions: No Pain:  no c/o pain  ADL: ADL ADL Comments: see Functional Assessment Tool  See Function Navigator for Current  Functional Status.   Therapy/Group: Individual Therapy  Willeen Cass [facility] 10/17/2015, 2:06 PM

## 2015-10-17 NOTE — Progress Notes (Signed)
[place] PHYSICAL MEDICINE & REHABILITATION     PROGRESS NOTE  Subjective/Complaints:  Patient seen sitting up in bed this morning.  He states he is "satisfied at present".  He also asked if he should be exercising his left upper extremity when in bed.  ROS: Denies CP, SOB, n/v/d.   Objective: Vital Signs: Blood pressure 139/70, pulse 74, temperature 97.1 F (36.2 C), temperature source Oral, resp. rate 16, height 5\' 9"  (1.753 m), weight 61.916 kg (136 lb 8 oz), SpO2 97 %. No results found.  Recent Labs  10/15/15 0554  WBC 7.6  HGB 11.9*  HCT 38.2*  PLT 167    Recent Labs  10/15/15 0554  NA 145  K 4.0  CL 106  GLUCOSE 122*  BUN 25*  CREATININE 1.14  CALCIUM 9.3   CBG (last 3)   Recent Labs  10/16/15 1622 10/16/15 2034 10/17/15 0647  GLUCAP 170* 163* 131*    Wt Readings from Last 3 Encounters:  10/13/15 61.916 kg (136 lb 8 oz)  10/07/15 66.2 kg (145 lb 15.1 oz)  02/20/14 63.957 kg (141 lb)    Physical Exam:  BP 139/70 mmHg  Pulse 74  Temp(Src) 97.1 F (36.2 C) (Oral)  Resp 16  Ht 5\' 9"  (1.753 m)  Wt 61.916 kg (136 lb 8 oz)  BMI 20.15 kg/m2  SpO2 97% HENT: Normocephalic, Atraumatic.  Eyes: EOM and Conj are normal.  Cardiovascular: Normal rate and regular rhythm. Grade 3 systolic ejection murmur Respiratory: Effort normal and breath sounds normal. No respiratory distress.  GI: Soft. Bowel sounds are normal. He exhibits no distension.  Neurological:  Patient is alert and oriented x2 with increased time and cues.   Moderate dysarthria.  He will follow simple verbal commands.  Left facial droop  Motor: LUE: 1/5 elbow flexion/extension, 0/5 distally mAS  1/4 shoulder abduction, 2/4 elbow flexion, 1/4 wrist flexion, 1/4 ankle dorsiflexion LLE: hip flexion 1/5, distally 0/5 RUE: 5/5 proximal to distal LLE: 4+/5 proximal to distal Skin: Skin is warm and dry. Grade 1 sacral decubitus, hips and heels without evidence of  breakdown  Assessment/Plan: 1. 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 Physiatrist is providing close team supervision and 24 hour management of active medical problems listed below. Physiatrist and rehab team continue to assess barriers to discharge/monitor patient progress toward functional and medical goals.  Function:  Bathing Bathing position   Position: Wheelchair/chair at sink  Bathing parts Body parts bathed by patient: Right arm, Left arm, Chest, Abdomen, Front perineal area Body parts bathed by helper: Back, Buttocks  Bathing assist Assist Level: More than reasonable time, Set up, Supervision or verbal cues, Touching or steadying assistance(Pt > 75%)   Set up : To obtain items  Upper Body Dressing/Undressing Upper body dressing   What is the patient wearing?: Pull over shirt/dress     Pull over shirt/dress - Perfomed by patient: Put head through opening Pull over shirt/dress - Perfomed by helper: Thread/unthread right sleeve, Thread/unthread left sleeve, Pull shirt over trunk        Upper body assist Assist Level: Touching or steadying assistance(Pt > 75%)      Lower Body Dressing/Undressing Lower body dressing   What is the patient wearing?: Pants, Ted Hose, Shoes (brief)   Underwear - Performed by helper: Thread/unthread left underwear leg, Pull underwear up/down, Thread/unthread right underwear leg Pants- Performed by  patient: Thread/unthread right pants leg Pants- Performed by helper: Thread/unthread right pants leg, Thread/unthread left pants leg, Pull pants up/down, Fasten/unfasten pants   Non-skid slipper socks- Performed by helper: Don/doff right sock, Don/doff left sock     Shoes - Performed by patient: Fasten right, Fasten left, Don/doff right shoe Shoes - Performed by helper: Don/doff left shoe        TED Hose - Performed by helper: Don/doff right TED hose, Don/doff left TED hose  Lower body assist Assist for lower body dressing: 2 Helpers (sit to stand)      Financial trader activity did not occur: No continent bowel/bladder event Toileting steps completed by patient: Adjust clothing prior to toileting Toileting steps completed by helper: Adjust clothing prior to toileting, Performs perineal hygiene, Adjust clothing after toileting Toileting Assistive Devices: Toilet aid  Toileting assist Assist level: Touching or steadying assistance (Pt.75%)   Transfers Chair/bed transfer   Chair/bed transfer method: Squat pivot Chair/bed transfer assist level: Maximal assist (Pt 25 - 49%/lift and lower) Chair/bed transfer assistive device: Armrests     Locomotion Ambulation Ambulation activity did not occur: Safety/medical concerns   Max distance: 30 Assist level: Maximal assist (Pt 25 - 49%)   Wheelchair Wheelchair activity did not occur: Safety/medical concerns Type: Manual Max wheelchair distance: 100 Assist Level: Touching or steadying assistance (Pt > 75%), Moderate assistance (Pt 50 - 74%)  Cognition Comprehension Comprehension assist level: Understands basic 50 - 74% of the time/ requires cueing 25 - 49% of the time  Expression Expression assist level: Expresses basic 50 - 74% of the time/requires cueing 25 - 49% of the time. Needs to repeat parts of sentences.  Social Interaction Social Interaction assist level: Interacts appropriately 50 - 74% of the time - May be physically or verbally inappropriate.  Problem Solving Problem solving assist level: Solves basic 25 - 49% of the time - needs direction more than half the time to initiate, plan or complete simple activities  Memory Memory assist level: Recognizes or recalls 25 - 49% of the time/requires cueing 50 - 75% of the time     Medical Problem List and Plan: 1. 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 DVT Prophylaxis/Anticoagulation: Subcutaneous heparin. Monitor platelet count and any signs of bleeding 3. Pain Management: Tylenol as needed 4. Mood/dementia: Discuss cognitive baseline with family. Resumed Namenda 5. Neuropsych: This patient is not capable of making decisions on his own behalf. 6. Skin/Wound Care: Routine skin checks 7. Fluids/Electrolytes/Nutrition: Routine I&O's   Dysphagia 1, Nectar thick 8. Hypertension.   Patient on Cozaar 100 mg daily prior to admission.   Cozaar 50mg  restarted on 3/5, increased to 100 mg on 3/8, will cont to monitor and consider increase if BPs persistently elevated -fairly well controlled at present 9. Diabetes mellitus of peripheral neuropathy. Hemoglobin A1c 8.1. Check blood sugars before meals and at bedtime.   Patient on Glucophage ex RR 500 mg twice daily prior to admission -   Glucophage 500 XR daily started on 3/10 and will increase to home dose if necessary  Amaryl increased to 2mg  on 3/5  Will monitor Cr and consider restarting glucophage if necessary  Fasting CBG 131 AM  10. Hyperlipidemia. Lipitor 11. BPH. Resumed Proscar 5 mg daily 12. History of gout.Colchicine. Monitor for any gout flareup 13. AKI: Improving  Cr.  1.14 on 3/10 14. Spasticity  Cont zanaflex  TID, increased to 4 mg TID on 3/10   Splints  LOS (Days) 10 A FACE TO FACE EVALUATION WAS PERFORMED  Roger [name] 10/17/2015 7:00 AM

## 2015-10-18 ENCOUNTER — Inpatient Hospital Stay (HOSPITAL_COMMUNITY): Payer: Medicare Other

## 2015-10-18 ENCOUNTER — Inpatient Hospital Stay (HOSPITAL_COMMUNITY): Payer: Medicare Other | Admitting: Physical Therapy

## 2015-10-18 ENCOUNTER — Inpatient Hospital Stay (HOSPITAL_COMMUNITY): Payer: Medicare Other | Admitting: Speech Pathology

## 2015-10-18 LAB — GLUCOSE, CAPILLARY
Glucose-Capillary: 102 mg/dL — ABNORMAL HIGH (ref 65–99)
Glucose-Capillary: 204 mg/dL — ABNORMAL HIGH (ref 65–99)
Glucose-Capillary: 105 mg/dL — ABNORMAL HIGH (ref 65–99)
Glucose-Capillary: 217 mg/dL — ABNORMAL HIGH (ref 65–99)

## 2015-10-18 MED ORDER — TIZANIDINE HCL 4 MG PO TABS
8.0000 mg | ORAL_TABLET | Freq: Three times a day (TID) | ORAL | Status: DC
Start: 2015-10-18 — End: 2015-10-27
  Administered 2015-10-18 – 2015-10-27 (×27): 8 mg via ORAL
  Filled 2015-10-18 (×15): qty 2
  Filled 2015-10-18: qty 4
  Filled 2015-10-18 (×7): qty 2
  Filled 2015-10-18: qty 4
  Filled 2015-10-18 (×4): qty 2

## 2015-10-18 NOTE — Progress Notes (Signed)
Speech Language Pathology Daily Session Note  Patient Details  Name: Roger [name] MRN: [id number] Date of Birth: 1931-02-15  Today's Date: 10/18/2015 SLP Individual Time: 1105-1200 SLP Individual Time Calculation (min): 55 min  Short Term Goals: Week 2: SLP Short Term Goal 1 (Week 2): Patient will consume current diet with minimal overt s/s of aspiration with Min A verbal cues for use of swallowing compensatory strategies.  SLP Short Term Goal 2 (Week 2): Patient will consume trials of thin liquids via tsp with minimal overt s/s of aspiration in less than 30% of trials to assess readniess for repeat MBS with Min A verbal cues for use of swallow strategies . SLP Short Term Goal 3 (Week 2): Patient will demonstrate efficient mastication of Dys. 2 textures over 3 consecutive sessions with minimal oral residue and no overt s/s of aspiration with Mod A verbal cues prior to upgrade.  SLP Short Term Goal 4 (Week 2): Patient will utilize an increased vocal intensity and over-articulation at the word and phrase level with Mod A verbal cues to maximize intelligibility to 75%.  SLP Short Term Goal 5 (Week 2): Patient will demonstrate sustained attention to functional tasks for 10 minutes with Min A verbal cues for redirection.  SLP Short Term Goal 6 (Week 2): Patient will attend to left field of enviornment during functional tasks with Min A verbal cues.   Skilled Therapeutic Interventions:  Pt was seen for skilled ST targeting goals for dysphagia and cognition.  SLP facilitated the session with trials of dys 2 textures to continue working towards diet progression.  Pt consumed advanced solids with min-mod verbal cues for use of extra swallows.  Pt presented with delayed, dry cough which he reported to be caused by "scratchy" pharyngeal residuals (trials consisted of mixed puree and graham cracker consistencies) which was mitigated with liquid wash of teaspoons of nectar thick liquids.  Recommend ongoing  trials of dys 2 textures with SLP only for now to monitor toleration and address consistent use of compensatory swallowing strategies.  SLP also facilitated the session with a basic board game targeting visual scanning to the left of midline and sustained attention to task.  Pt required min verbal cues to attend to left side of playing board and he was able to sustain his attention to task for ~5 minute intervals with mod verbal cues for redirection.  Pt was returned to room and left in wheelchair with daughter at bedside.  Continue per current plan of care.    Function:  Eating Eating   Modified Consistency Diet: Yes Eating Assist Level: Supervision or verbal cues;Set up assist for   Eating Set Up Assist For: Opening containers       Cognition Comprehension Comprehension assist level: Understands basic 75 - 89% of the time/ requires cueing 10 - 24% of the time  Expression   Expression assist level: Expresses basic 50 - 74% of the time/requires cueing 25 - 49% of the time. Needs to repeat parts of sentences.  Social Interaction Social Interaction assist level: Interacts appropriately 50 - 74% of the time - May be physically or verbally inappropriate.  Problem Solving Problem solving assist level: Solves basic 25 - 49% of the time - needs direction more than half the time to initiate, plan or complete simple activities  Memory Memory assist level: Recognizes or recalls 25 - 49% of the time/requires cueing 50 - 75% of the time    Pain Pain Assessment Pain Assessment: No/denies pain  Therapy/Group: Individual  Therapy  Cailah Reach, Melanee Spry 10/18/2015, 4:04 PM

## 2015-10-18 NOTE — Progress Notes (Signed)
[place] PHYSICAL MEDICINE & REHABILITATION     PROGRESS NOTE  Subjective/Complaints:  Pt seen eating breakfast this AM.  He slept well overnight.    ROS: Denies CP, SOB, n/v/d.   Objective: Vital Signs: Blood pressure 160/70, pulse 76, temperature 98 F (36.7 C), temperature source Oral, resp. rate 18, height 5\' 9"  (1.753 m), weight 61.916 kg (136 lb 8 oz), SpO2 98 %. No results found. No results for input(s): WBC, HGB, HCT, PLT in the last 72 hours. No results for input(s): NA, K, CL, GLUCOSE, BUN, CREATININE, CALCIUM in the last 72 hours.  Invalid input(s): CO CBG (last 3)   Recent Labs  10/17/15 1649 10/17/15 2103 10/18/15 0702  GLUCAP 133* 198* 102*    Wt Readings from Last 3 Encounters:  10/13/15 61.916 kg (136 lb 8 oz)  10/07/15 66.2 kg (145 lb 15.1 oz)  02/20/14 63.957 kg (141 lb)    Physical Exam:  BP 160/70 mmHg  Pulse 76  Temp(Src) 98 F (36.7 C) (Oral)  Resp 18  Ht 5\' 9"  (1.753 m)  Wt 61.916 kg (136 lb 8 oz)  BMI 20.15 kg/m2  SpO2 98% HENT: Normocephalic, Atraumatic.  Eyes: EOM and Conj are normal.  Cardiovascular: Normal rate and regular rhythm. Grade 3 systolic ejection murmur Respiratory: Effort normal and breath sounds normal. No respiratory distress.  GI: Soft. Bowel sounds are normal. He exhibits no distension.  Neurological:  Patient is alert and oriented x2 with increased time and cues.   Dysarthria.  Left facial droop  Motor: LUE: 1/5 elbow flexion/extension, 0/5 distally mAS  1/4 shoulder abduction, 3/4 elbow flexion, 1+/4 wrist flexion, 1+/4 ankle dorsiflexion LLE: hip flexion 1/5, distally 0/5 RUE: 5/5 proximal to distal LLE: 4+/5 proximal to distal Skin: Skin is warm and dry. Grade 1 sacral decubitus, hips and heels without evidence of breakdown  Assessment/Plan: 1. 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 Physiatrist is providing close team supervision and 24 hour management of active medical problems listed below. Physiatrist and rehab team continue to assess barriers to discharge/monitor patient progress toward functional and medical goals.  Function:  Bathing Bathing position   Position: Wheelchair/chair at sink  Bathing parts Body parts bathed by patient: Right arm, Left arm, Chest, Abdomen, Front perineal area Body parts bathed by helper: Back, Buttocks  Bathing assist Assist Level: More than reasonable time, Set up, Supervision or verbal cues, Touching or steadying assistance(Pt > 75%)   Set up : To obtain items  Upper Body Dressing/Undressing Upper body dressing   What is the patient wearing?: Pull over shirt/dress     Pull over shirt/dress - Perfomed by patient: Put head through opening Pull over shirt/dress - Perfomed by helper: Thread/unthread right sleeve, Thread/unthread left sleeve, Pull shirt over trunk        Upper body assist Assist Level: Touching or steadying assistance(Pt > 75%)      Lower Body Dressing/Undressing Lower body dressing   What is the patient wearing?: Pants, Ted Hose, Shoes (brief)   Underwear - Performed by helper: Thread/unthread left underwear leg, Pull underwear up/down, Thread/unthread right underwear leg Pants- Performed by patient: Thread/unthread right pants leg Pants- Performed by helper: Thread/unthread right pants leg, Thread/unthread left pants leg, Pull pants up/down, Fasten/unfasten pants   Non-skid slipper socks- Performed by helper: Don/doff right sock, Don/doff left sock     Shoes - Performed  by patient: Fasten right, Fasten left, Don/doff right shoe Shoes - Performed by helper: Don/doff left shoe       TED Hose - Performed by helper: Don/doff right TED hose, Don/doff left TED hose  Lower body assist Assist for lower body dressing: 2 Helpers (sit to stand)      Ambulance person activity did not occur: No continent bowel/bladder event Toileting steps completed by patient: Adjust clothing prior to toileting Toileting steps completed by helper: Adjust clothing prior to toileting, Performs perineal hygiene, Adjust clothing after toileting Toileting Assistive Devices: Toilet aid  Toileting assist Assist level: Touching or steadying assistance (Pt.75%)   Transfers Chair/bed transfer   Chair/bed transfer method: Squat pivot Chair/bed transfer assist level: Maximal assist (Pt 25 - 49%/lift and lower) Chair/bed transfer assistive device: Armrests     Locomotion Ambulation Ambulation activity did not occur: Safety/medical concerns   Max distance: 30 Assist level: Maximal assist (Pt 25 - 49%)   Wheelchair Wheelchair activity did not occur: Safety/medical concerns Type: Manual Max wheelchair distance: 100 Assist Level: Touching or steadying assistance (Pt > 75%), Moderate assistance (Pt 50 - 74%)  Cognition Comprehension Comprehension assist level: Understands basic 75 - 89% of the time/ requires cueing 10 - 24% of the time  Expression Expression assist level: Expresses basic 50 - 74% of the time/requires cueing 25 - 49% of the time. Needs to repeat parts of sentences.  Social Interaction Social Interaction assist level: Interacts appropriately 50 - 74% of the time - May be physically or verbally inappropriate.  Problem Solving Problem solving assist level: Solves basic 25 - 49% of the time - needs direction more than half the time to initiate, plan or complete simple activities  Memory Memory assist level: Recognizes or recalls 25 - 49% of the time/requires cueing 50 - 75% of the time     Medical Problem List and Plan: 1. 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 DVT Prophylaxis/Anticoagulation: Subcutaneous heparin.  Monitor platelet count and any signs of bleeding 3. Pain Management: Tylenol as needed 4. Mood/dementia: Discuss cognitive baseline with family. Resumed Namenda 5. Neuropsych: This patient is not capable of making decisions on his own behalf. 6. Skin/Wound Care: Routine skin checks 7. Fluids/Electrolytes/Nutrition: Routine I&O's   Dysphagia 1, Nectar thick 8. Hypertension.   Patient on Cozaar 100 mg daily prior to admission.   Cozaar  restarted on 3/5, increased to 100 mg on 3/8, one elevated reading overnight, otherwise fairly well controlled 9. Diabetes mellitus of peripheral neuropathy. Hemoglobin A1c 8.1. Check blood sugars before meals and at bedtime.   Patient on Glucophage ex RR 500 mg twice daily prior to admission -   Glucophage 500 XR daily started on 3/10 and will increase to home dose if necessary  Amaryl increased to  on 3/5  Fasting CBG 102 AM  10. Hyperlipidemia. Lipitor 11. BPH. Resumed Proscar 5 mg daily 12. History of gout.Colchicine. Monitor for any gout flareup 13. AKI: Improving  Cr.  1.14 on 3/10 14. Spasticity  Zanaflex increased to  TID on 3/13  Splints  LOS (Days) 11 A FACE TO FACE EVALUATION WAS PERFORMED  Ankit Karis Juba 10/18/2015 8:40 AM

## 2015-10-18 NOTE — Progress Notes (Signed)
Recreational Therapy Session Note  Patient Details  Name: Roger [name] MRN: [id number] Date of Birth: Jan 08, 1931 Today's Date: 10/18/2015  Pt participated in animal assisted activity/therapy seated w/c level, daughter present with supervision.   [name] 10/18/2015, 4:03 PM

## 2015-10-18 NOTE — Progress Notes (Signed)
Occupational Therapy Session Note  Patient Details  Name: Roger [name] MRN: [id number] Date of Birth: 10-Jun-1931  Today's Date: 10/18/2015 OT Individual Time: 1000-1100 OT Individual Time Calculation (min): 60 min   Short Term Goals: Week 2:  OT Short Term Goal 1 (Week 2): Pt will demo ability to completed self-feeding with only setup to open containers OT Short Term Goal 2 (Week 2): Pt will don pull over shirt sitting supported with min assist to manage left UE OT Short Term Goal 3 (Week 2): Pt will complete sit<>stand with mod assist in prep for lower body dressing OT Short Term Goal 4 (Week 2): Pt will lace right pant leg with steadying assist to maintain sitting balance OT Short Term Goal 5 (Week 2): Pt will complete 4 of 5 grooming tasks with setup and supervision  Skilled Therapeutic Interventions/Progress Updates: ADL-retraining with focus on transfer and assisted bed mobility with family (daughter present throughout session).   Focus of session was to develop awareness and improve weight-shifting to maintain midline orientation sitting supported in w/c and unsupported at EOB.   OT also educated daughter how to assist with pelvic lift through support and management of pt's left LE.  Pt able to push with RLE and pull himself from supine to head of bed with overall min assist.   OT re-educated pt and daughter NDT method to roll to left and rise to sit at EOB.   Pt able to complete bed mobility to roll to left and sit at EOB with only mod assist this session although complicated by hinge of bed and bed rail under his leg.  Plan to continue re-ed at raised mat during f/u session.   Daughter also completed slide board transfer with pt from bed to w/c.   OT provided re-ed on methods to reduce pushing and leaning toward left, sit<>stand, and AE training (transfer disc) to minimize injury to caregiver.  Pt left with daughter attending at end of session.       Therapy Documentation Precautions:   Precautions Precautions: Fall Restrictions Weight Bearing Restrictions: No  Pain: Pain Assessment Pain Assessment: 0-10 Pain Score: 0-No pain   ADL: ADL ADL Comments: see Functional Assessment Tool   See Function Navigator for Current Functional Status.   Therapy/Group: Individual Therapy  Nafeesa Dils 10/18/2015, 12:20 PM

## 2015-10-18 NOTE — Progress Notes (Signed)
Physical Therapy Session Note  Patient Details  Name: Roger [name] MRN: [id number] Date of Birth: February 23, 1931  Today's Date: 10/18/2015 PT Individual Time: 1300-1427 PT Individual Time Calculation (min): 87 min   Short Term Goals: Week 2:  PT Short Term Goal 1 (Week 2): Pt will be able to transfer supine<>sit with Mod A with use of bed features. PT Short Term Goal 2 (Week 2): Pt will be able to transfer sit<>stand with Min A. PT Short Term Goal 3 (Week 2): Pt will be able to perform stand pivot tranfsfers with Mod A. PT Short Term Goal 4 (Week 2): Pt will be able to tolerate sitting at EOB with Min A and able to correct LOB & posture.  Skilled Therapeutic Interventions/Progress Updates:    Pt received in w/c & agreeable to PT. Pt able to propel w/c ~40 ft with RUE & RLE; PT provided maximum cuing to steer w/c using RLE. Pt transferred sit<>stand with Min A in hallway and ambulated 30 ft with R hall rail with PT approximating L knee to help prevent L knee buckling, which was occuring frequently, as well as to advance LLE and perform weight shifts at pelvis. Pt required maximum verbal cuing for sequencing. Pt reported need to be changed so pt transferred to room via w/c total A where he performed sit<>stand with Max A, pt required cuing for anterior weight shift at pelvis to help increase upright posture. Pt able to perform hygiene tasks with Max A from PT to maintain standing. Once task was completed pt ambulated another 30 ft with R hall rail and max A from PT. Pt able to help initiate advancement of LLE and demonstrated improved upright posture during gait. Pt then assisted with transfer onto mat table where he performed tall sitting to focus on strengthening core muscles and promote upright posture. PT provided max A to shift pelvis anteriorly and chest posteriorly for posture; pt with decreased core strength. Attempted to have pt lie prone but c/o neck pain therefore transferred to supine. PT  observed hamstring tone in LLE and pt performed LLE hamstring hold relax stretch to help reduce muscle tightness. Pt performed bridging, then progressed to bridging with Adductor strengthening by holding ball between BLE knees, then progressed to bridging with ball, LLE, and RLE knee extended. Pt transferred supine>sit on mat table with Max verbal cuing for LE technique & overall Mod A. Pt performed squat pivot mat>w/c with Mod A in  Multiple scoots. At end of session pt left in w/c in care of daughter.   Therapy Documentation Precautions:  Precautions Precautions: Fall Restrictions Weight Bearing Restrictions: No  Pain: Pain Assessment Pain Assessment: No/denies pain   See Function Navigator for Current Functional Status.   Therapy/Group: Individual Therapy  Sandi Mariscal 10/18/2015, 5:28 PM

## 2015-10-18 NOTE — Progress Notes (Signed)
Placed patient on home CPAP unit for the night.  

## 2015-10-19 ENCOUNTER — Inpatient Hospital Stay (HOSPITAL_COMMUNITY): Payer: Medicare Other

## 2015-10-19 ENCOUNTER — Inpatient Hospital Stay (HOSPITAL_COMMUNITY): Payer: Medicare Other | Admitting: Physical Therapy

## 2015-10-19 ENCOUNTER — Inpatient Hospital Stay (HOSPITAL_COMMUNITY): Payer: Medicare Other | Admitting: Speech Pathology

## 2015-10-19 DIAGNOSIS — G4700 Insomnia, unspecified: Secondary | ICD-10-CM | POA: Insufficient documentation

## 2015-10-19 LAB — GLUCOSE, CAPILLARY
GLUCOSE-CAPILLARY: 193 mg/dL — AB (ref 65–99)
GLUCOSE-CAPILLARY: 138 mg/dL — AB (ref 65–99)
Glucose-Capillary: 100 mg/dL — ABNORMAL HIGH (ref 65–99)
Glucose-Capillary: 175 mg/dL — ABNORMAL HIGH (ref 65–99)

## 2015-10-19 MED ORDER — CAPTOPRIL 6.25 MG HALF TABLET
6.2500 mg | ORAL_TABLET | Freq: Every day | ORAL | Status: DC
  Administered 2015-10-19: 6.25 mg via ORAL
  Filled 2015-10-19: qty 1

## 2015-10-19 MED ORDER — ACYCLOVIR 5 % EX OINT
TOPICAL_OINTMENT | CUTANEOUS | Status: AC
  Administered 2015-10-19 – 2015-10-20 (×3): 1 via TOPICAL
  Administered 2015-10-20 – 2015-10-21 (×9): via TOPICAL
  Administered 2015-10-21: 1 via TOPICAL
  Administered 2015-10-21 – 2015-10-24 (×18): via TOPICAL
  Administered 2015-10-24: 1 via TOPICAL
  Administered 2015-10-24 – 2015-10-25 (×5): via TOPICAL
  Administered 2015-10-25: 1 via TOPICAL
  Administered 2015-10-25 – 2015-10-26 (×4): via TOPICAL
  Filled 2015-10-19: qty 15

## 2015-10-19 MED ORDER — TRAZODONE HCL 50 MG PO TABS
25.0000 mg | ORAL_TABLET | Freq: Every evening | ORAL | Status: DC | PRN
  Administered 2015-10-20 – 2015-10-21 (×2): 25 mg via ORAL
  Filled 2015-10-19 (×2): qty 1

## 2015-10-19 NOTE — Progress Notes (Signed)
Occupational Therapy Session Note  Patient Details  Name: Roger [name] MRN: [id number] Date of Birth: May 31, 1931  Today's Date: 10/19/2015 OT Individual Time: 0930-1030 OT Individual Time Calculation (min): 60 min   Short Term Goals: Week 2:  OT Short Term Goal 1 (Week 2): Pt will demo ability to completed self-feeding with only setup to open containers OT Short Term Goal 2 (Week 2): Pt will don pull over shirt sitting supported with min assist to manage left UE OT Short Term Goal 3 (Week 2): Pt will complete sit<>stand with mod assist in prep for lower body dressing OT Short Term Goal 4 (Week 2): Pt will lace right pant leg with steadying assist to maintain sitting balance OT Short Term Goal 5 (Week 2): Pt will complete 4 of 5 grooming tasks with setup and supervision  Skilled Therapeutic Interventions/Progress Updates: ADL-retraining at shower level with focus on stand-pivot transfer, static and dynamic sitting balance, improved alertness, attention, awareness and problem-solving, family ed on progression in treatment to inhibit daughter's well-intentioned excessive cuing during treatment.  Pt completed transfer from bed to w/c, w/c to/from shower chair, bathed and dressed with overall max assistance d/t left hemiplegia, hypertonicity at left lower and upper extremity, impaired self-monitoring, impaired dynamic sitting balance and impaired initiation and problem-solving.  Pt is compliant with directions and cues provided but demo's limited carry-over from day-to-day requiring explicit directions to advance through session.  Pt's static sitting balance is improving, particularly while sitting in w/c; no evidence of lateral lean noted with use of half-lap tray.   Pt left with daughter at end of session at sink assisting pt with grooming.     Therapy Documentation Precautions:  Precautions Precautions: Fall Restrictions Weight Bearing Restrictions: No   Pain: No/denies pain     ADL: ADL ADL Comments: see Functional Assessment Tool   See Function Navigator for Current Functional Status.   Therapy/Group: Individual Therapy  Safa Derner 10/19/2015, 1:08 PM

## 2015-10-19 NOTE — Plan of Care (Signed)
Problem: RH BLADDER ELIMINATION Goal: RH STG MANAGE BLADDER WITH ASSISTANCE STG Manage Bladder With mod Assistance  Outcome: Not Progressing Pt remains incont at night regardless of timed toileting

## 2015-10-19 NOTE — Progress Notes (Signed)
Physical Therapy Session Note  Patient Details  Name: Roger [name] MRN: [id number] Date of Birth: January 27, 1931  Today's Date: 10/19/2015 PT Individual Time: 4098-1191 PT Individual Time Calculation (min): 72 min   Short Term Goals: Week 2:  PT Short Term Goal 1 (Week 2): Pt will be able to transfer supine<>sit with Mod A with use of bed features. PT Short Term Goal 2 (Week 2): Pt will be able to transfer sit<>stand with Min A. PT Short Term Goal 3 (Week 2): Pt will be able to perform stand pivot tranfsfers with Mod A. PT Short Term Goal 4 (Week 2): Pt will be able to tolerate sitting at EOB with Min A and able to correct LOB & posture.  Skilled Therapeutic Interventions/Progress Updates:    Pt received in bed & agreeable to PT. Pt rolled L with cuing & assistance to transfer LE off of bed and push up with RUE to transfer sidelying>sit. Pt participated more in sidelying>sit transfer but requires maximum verbal cuing and has difficulty transitioning completely to upright. Pt able to maintain sitting balance at EOB for 1 minute 40 seconds with close supervision from PT & BLE on floor. Pt completed squat pivot bed>w/c with Min A. In hallway pt ambulated with max A from PT for 30 ft, utilizing R hand rail in hallway. Pt able to participate in advancing LLE while PT assisted with weight shift to R. Pt ambulated 55 ft + 20 ft + 25 ft with hemiwalker in RUE, initiating and advancing LLE with PT assisting with weight shifts. Pt with increased upright posture during gait, but fatigued quickly. Pt's ability to sequence gait is improving, requiring less verbal cuing from PT. At end of session pt reported need to use bathroom; PT assisted pt with sit>stand and stand>pivot transfer from w/c>commode; pt left in care of NT & RN at end of session.   Therapy Documentation Precautions:  Precautions Precautions: Fall Restrictions Weight Bearing Restrictions: No  Pain: Pain Assessment Pain Assessment: No/denies  pain   See Function Navigator for Current Functional Status.   Therapy/Group: Individual Therapy  Sandi Mariscal 10/19/2015, 5:14 PM

## 2015-10-19 NOTE — Progress Notes (Signed)
Pt removed CPAP saying his mouth was too dry.

## 2015-10-19 NOTE — Progress Notes (Signed)
RN and family discussed families concern of patient being woken up q2hour to void, change brief, and be repositioned. RN educated both family and patient. Family expressed that they felt sleep was more important than a wet diaper. RN educated again and suggested trying condom cath again. Family felt misinformed on condom cath use. After RN re educated family, they decided to try the condom cath again, and to allow him to sleep. Family does not want staff to wake up patient through out the night. Family even asked RN to not wake up patient for acyclovir topical cream. Pt LBM 10/16/15 but family says 10/17/15. Pt willing to take medication once he wakes up in the morning. Condom cath placed and will continue with plan of care. Rudie Meyer, RN

## 2015-10-19 NOTE — Patient Care Conference (Signed)
Inpatient RehabilitationTeam Conference and Plan of Care Update Date: 10/19/2015   Time: 11:00 AM    Patient Name: Roger [name]      Medical Record Number: [id number]  Date of Birth: May 23, 1931 Sex: Male         Room/Bed: 4M01C/4M01C-01 Payor Info: Payor: [employer] MEDICARE / Plan: [employer] MEDICARE / Product Type: *No Product type* /    Admitting Diagnosis: R CVA  Admit Date/Time:  10/07/2015  3:04 PM Admission Comments: No comment available   Primary Diagnosis:  <principal problem not specified> Principal Problem: <principal problem not specified>  Patient Active Problem List   Diagnosis Date Noted  . Insomnia   . Muscle spasticity   . AKI (acute kidney injury) (HCC)   . Benign essential HTN   . Dementia   . DM type 2 with diabetic peripheral neuropathy (HCC)   . Hemiplegia affecting non-dominant side, post-stroke (HCC)   . Dysphagia as late effect of cerebrovascular disease   . Basal ganglia infarction (HCC) 10/07/2015  . Acute left hemiparesis (HCC)   . Gait disturbance, post-stroke   . HLD (hyperlipidemia)   . Slurred speech 10/04/2015  . Malignant hypertension 10/04/2015  . Diabetes (HCC) 10/04/2015  . Stroke (HCC) 10/04/2015  . Left-sided weakness   . Stroke-like symptoms   . Physical deconditioning   . Diabetes mellitus with complication (HCC)   . Alzheimer's disease 08/14/2013    Expected Discharge Date: Expected Discharge Date: 10/28/15  Team Members Present: Physician leading conference: Dr. Maryla Morrow Social Worker Present: Dossie Der, LCSW Nurse Present: Chana Bode, RN PT Present: Edman Circle, PT;Other (comment) Reggy Eye) OT Present: Donzetta Kohut, OT;Jennifer Katrinka Blazing, OT SLP Present: Jackalyn Lombard, SLP PPS Coordinator present : Tora Duck, RN, CRRN     Current Status/Progress Goal Weekly Team Focus  Medical   Left side weakness, dysphagia/dysarthira secondary to right CVA with insomnia, BP issues, AKI, DM, spastisity  Improve mobility,  cognition, adjust DM meds, BP meds, adjust spastisity meds, follow labs  see above   Bowel/Bladder   pt continent of bowel. LBM: 10/16/15. incont/ cont of bladder with timed toileting. Family refusing condom cath HS. Pt still incont at times with q1.5-2 hour timed toileting at night.   manage bowel and bladder with mod assist   continue with timed toileting q1.5 hours   Swallow/Nutrition/ Hydration   dys 1, nectar thick liquids; mod assist for use of swallowing precautions   supervision- least restrictive  trials of thin liquids and dys 2 textures    ADL's   Overall Max assist for bathing, dressing, transfers, Min assist for grooming and self-feeding, standby for sitting balance  Downgraded to Mod-Max Assist for BADL, Mod A for transfers, Supervision for sitting balance  Family ed, sitting balance, transfers, static standing, activity tolerance   Mobility   Max a ambulating with R hall rial, Mod A for bed mobility, sit<>stand transfers range Min<>Max, Mod a for squat pivot  Min A sitting balance, Min A bed mobility, Min A transfers, Mod A ambulation, supervision for w/c mobility   strengthening core muscles, strengthening LLE, midline balance, weight shifts, w/c mobility   Communication   mod-max A for increased vocal intensity   mod A  continue to address vocal intensity in phrases   Safety/Cognition/ Behavioral Observations  slow progress, mod assist   min A or basic safety  left inattention, sustained attention   Pain   pt denies any pain          Skin  sacrum reddened but blanchable. q 2 hour turn, PRAFO boot, and sacrum foam placed 10/18/15  free of skin breakdown with min assist   turn q2, and assess skin, keep foam on sacrum       *See Care Plan and progress notes for long and short-term goals.  Barriers to Discharge: AKI, Spasticity, HTN, insomnia    Possible Resolutions to Barriers:  Pt and family edu, adjust DM meds, optimize BP meds, monitor labs, increased spasticity meds     Discharge Planning/Teaching Needs:  Pt may not be at a level to go home upon discharge from rehab due to amount of physical care he will require. Discussing options with wife and daugther      Team Discussion:  Slow progress in his therapies-trials of Dys 2 with Speech. DC condom cath at nights-see if voids. Sitting balance much improved and able to maintain. Tone in leg now, on meds for this due to tone in arm also. May need to downgrade goals by next week if continues to progress this slowly.  Revisions to Treatment Plan:  None   Continued Need for Acute Rehabilitation Level of Care: The patient requires daily medical management by a physician with specialized training in physical medicine and rehabilitation for the following conditions: Daily direction of a multidisciplinary physical rehabilitation program to ensure safe treatment while eliciting the highest outcome that is of practical value to the patient.: Yes Daily medical management of patient stability for increased activity during participation in an intensive rehabilitation regime.: Yes Daily analysis of laboratory values and/or radiology reports with any subsequent need for medication adjustment of medical intervention for : Neurological problems;Diabetes problems;Blood pressure problems;Renal problems  Lucy Chris 10/19/2015, 3:18 PM

## 2015-10-19 NOTE — Progress Notes (Signed)
[place] PHYSICAL MEDICINE & REHABILITATION     PROGRESS NOTE  Subjective/Complaints:  Patient seen sitting in bed this morning. His daughter is at bedside. He states he did not sleep well overnight because he was doing a lot of thinking. He also did not want to take trazodone.   ROS: Denies CP, SOB, n/v/d.   Objective: Vital Signs: Blood pressure 178/81, pulse 88, temperature 98.2 F (36.8 C), temperature source Oral, resp. rate 16, height 5\' 9"  (1.753 m), weight 59.6 kg (131 lb 6.3 oz), SpO2 99 %. No results found. No results for input(s): WBC, HGB, HCT, PLT in the last 72 hours. No results for input(s): NA, K, CL, GLUCOSE, BUN, CREATININE, CALCIUM in the last 72 hours.  Invalid input(s): CO CBG (last 3)   Recent Labs  10/18/15 1638 10/18/15 2106 10/19/15 0644  GLUCAP 105* 217* 100*    Wt Readings from Last 3 Encounters:  10/18/15 59.6 kg (131 lb 6.3 oz)  10/07/15 66.2 kg (145 lb 15.1 oz)  02/20/14 63.957 kg (141 lb)    Physical Exam:  BP 178/81 mmHg  Pulse 88  Temp(Src) 98.2 F (36.8 C) (Oral)  Resp 16  Ht 5\' 9"  (1.753 m)  Wt 59.6 kg (131 lb 6.3 oz)  BMI 19.39 kg/m2  SpO2 99% HENT: Normocephalic, Atraumatic.  Eyes: EOM and Conj are normal.  Cardiovascular: Normal rate and regular rhythm. Grade 3 systolic ejection murmur Respiratory: Effort normal and breath sounds normal. No respiratory distress.  GI: Soft. Bowel sounds are normal. He exhibits no distension.  Neurological:  Patient is alert and oriented x2 with increased time and cues.   Dysarthria.  Left facial droop  Motor: LUE: 1/5 elbow flexion/extension, 0/5 distally mAS  1/4 shoulder abduction, 3/4 elbow flexion, 1+/4 wrist flexion, 1+/4 ankle dorsiflexion LLE: hip flexion 3-/5, distally 0/5 RUE: 5/5 proximal to distal LLE: 4+/5 proximal to distal Skin: Skin is warm and dry. Grade 1 sacral decubitus, hips and heels without evidence of breakdown  Assessment/Plan: 1. 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 Physiatrist is providing close team supervision and 24 hour management of active medical problems listed below. Physiatrist and rehab team continue to assess barriers to discharge/monitor patient progress toward functional and medical goals.  Function:  Bathing Bathing position   Position: Wheelchair/chair at sink  Bathing parts Body parts bathed by patient: Right arm, Left arm, Chest, Abdomen, Front perineal area Body parts bathed by helper: Back, Buttocks  Bathing assist Assist Level: More than reasonable time, Set up, Supervision or verbal cues, Touching or steadying assistance(Pt > 75%)   Set up : To obtain items  Upper Body Dressing/Undressing Upper body dressing   What is the patient wearing?: Pull over shirt/dress     Pull over shirt/dress - Perfomed by patient: Put head through opening Pull over shirt/dress - Perfomed by helper: Thread/unthread right sleeve, Thread/unthread left sleeve, Pull shirt over trunk        Upper body assist Assist Level: Touching or steadying assistance(Pt > 75%)      Lower Body Dressing/Undressing Lower body dressing   What is the patient wearing?: Pants, Ted Hose, Shoes (brief)   Underwear - Performed by helper: Thread/unthread left underwear leg, Pull underwear up/down, Thread/unthread right underwear leg Pants- Performed by patient: Thread/unthread right pants leg Pants- Performed by helper: Thread/unthread right pants leg, Thread/unthread left pants leg, Pull pants  up/down, Fasten/unfasten pants   Non-skid slipper socks- Performed by helper: Don/doff right sock, Don/doff left sock     Shoes - Performed by patient: Fasten right, Fasten left, Don/doff right shoe Shoes - Performed by helper: Don/doff left shoe       TED Hose - Performed by helper: Don/doff right TED hose,  Don/doff left TED hose  Lower body assist Assist for lower body dressing: 2 Helpers (sit to stand)      Financial trader activity did not occur: No continent bowel/bladder event Toileting steps completed by patient: Adjust clothing prior to toileting Toileting steps completed by helper: Adjust clothing prior to toileting, Performs perineal hygiene, Adjust clothing after toileting Toileting Assistive Devices: Toilet aid  Toileting assist Assist level: Touching or steadying assistance (Pt.75%)   Transfers Chair/bed transfer   Chair/bed transfer method: Squat pivot Chair/bed transfer assist level: Maximal assist (Pt 25 - 49%/lift and lower) Chair/bed transfer assistive device: Armrests     Locomotion Ambulation Ambulation activity did not occur: Safety/medical concerns   Max distance: 30 Assist level: Maximal assist (Pt 25 - 49%)   Wheelchair Wheelchair activity did not occur: Safety/medical concerns Type: Manual Max wheelchair distance: 40 Assist Level: Supervision or verbal cues  Cognition Comprehension Comprehension assist level: Understands basic 75 - 89% of the time/ requires cueing 10 - 24% of the time  Expression Expression assist level: Expresses basic 50 - 74% of the time/requires cueing 25 - 49% of the time. Needs to repeat parts of sentences.  Social Interaction Social Interaction assist level: Interacts appropriately 50 - 74% of the time - May be physically or verbally inappropriate.  Problem Solving Problem solving assist level: Solves basic 25 - 49% of the time - needs direction more than half the time to initiate, plan or complete simple activities  Memory Memory assist level: Recognizes or recalls 25 - 49% of the time/requires cueing 50 - 75% of the time     Medical Problem List and Plan: 1. 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 DVT Prophylaxis/Anticoagulation: Subcutaneous heparin. Monitor platelet count and any signs of bleeding 3. Pain Management: Tylenol as needed 4. Mood/dementia: Discuss cognitive baseline with family. Resumed Namenda 5. Neuropsych: This patient is not capable of making decisions on his own behalf. 6. Skin/Wound Care: Routine skin checks 7. Fluids/Electrolytes/Nutrition: Routine I&O's   Dysphagia 1, Nectar thick 8. Hypertension.   Patient on Cozaar 100 mg daily prior to admission.   Cozaar 50mg  restarted on 3/5, increased to 100 mg on 3/8, one elevated reading overnight, otherwise fairly well controlled  Will add low dose Captopril at night due to elevated readings early in the morning. 9. Diabetes mellitus of peripheral neuropathy. Hemoglobin A1c 8.1. Check blood sugars before meals and at bedtime.   Patient on Glucophage ex RR 500 mg twice daily prior to admission -   Glucophage 500 XR daily started on 3/10 and will increase to home dose if necessary  Amaryl increased to 2mg  on 3/5  Fasting CBG 100 AM  10. Hyperlipidemia. Lipitor 11. BPH. Resumed Proscar 5 mg daily 12. History of gout.Colchicine. Monitor for any gout flareup 13. AKI: Improving  Cr.  1.14 on 3/10 14. Spasticity  Zanaflex increased to 8mg  TID on 3/13  Splints 15. Insomnia  Trazodone 25 when necessary  LOS (Days) 12 A FACE TO FACE EVALUATION WAS PERFORMED  Elantra Caprara Willeen Cass  Selita Staiger 10/19/2015 8:15 AM

## 2015-10-19 NOTE — Progress Notes (Signed)
Social Work Patient ID: Roger [name], male   DOB: 10/01/1930, 82 y.o.   MRN: [id number] Met with pt and daughter to discuss team conference and progress toward his goals-daughter voiced she is here and taking with potential caregivers. They-she and pt's wife want him home and pt wants to go home but he is much care at this point. Daughter is here daily to encourage Dad and provide support.  Target discharge still 3/23 

## 2015-10-19 NOTE — Plan of Care (Signed)
Problem: RH BOWEL ELIMINATION Goal: RH STG MANAGE BOWEL W/MEDICATION W/ASSISTANCE STG Manage Bowel with Medication with Assistance.  Outcome: Not Progressing LBM: 3/11 per chart, and 3/12 per family. Family would like to allow the patient more time before PRN medications are administered. Pt agreed to take medication in the AM if he is unable to go before then.   Problem: RH SKIN INTEGRITY Goal: RH STG SKIN FREE OF INFECTION/BREAKDOWN Min A  Outcome: Progressing No breakdown, but patient and family refuse patient to be turned throughout the night, and patient does not turn himself in the bed.

## 2015-10-19 NOTE — Progress Notes (Signed)
Speech Language Pathology Daily Session Note  Patient Details  Name: Roger [name] MRN: [id number] Date of Birth: [birth year]  Today's Date: 10/19/2015 SLP Individual Time: 1030-1130 SLP Individual Time Calculation (min): 60 min  Short Term Goals: Week 2: SLP Short Term Goal 1 (Week 2): Patient will consume current diet with minimal overt s/s of aspiration with Min A verbal cues for use of swallowing compensatory strategies.  SLP Short Term Goal 2 (Week 2): Patient will consume trials of thin liquids via tsp with minimal overt s/s of aspiration in less than 30% of trials to assess readniess for repeat MBS with Min A verbal cues for use of swallow strategies . SLP Short Term Goal 3 (Week 2): Patient will demonstrate efficient mastication of Dys. 2 textures over 3 consecutive sessions with minimal oral residue and no overt s/s of aspiration with Mod A verbal cues prior to upgrade.  SLP Short Term Goal 4 (Week 2): Patient will utilize an increased vocal intensity and over-articulation at the word and phrase level with Mod A verbal cues to maximize intelligibility to 75%.  SLP Short Term Goal 5 (Week 2): Patient will demonstrate sustained attention to functional tasks for 10 minutes with Min A verbal cues for redirection.  SLP Short Term Goal 6 (Week 2): Patient will attend to left field of enviornment during functional tasks with Min A verbal cues.   Skilled Therapeutic Interventions: Skilled treatment session focused on cognition and dysphagia goals. SLP facilitated session by providing max verbal and tactile cues for alertness. Pt and daughter stated that pt didn't sleep night before secondary to frequent checks for bladder training. Pt frequently closed eyes during session. As a result, he required max verbal and tactile cues to participate and effectively target goals. Pt with trials of dysphagia 2. During consumption, pt with wet voice, secondary to difficulty with swallow initiation. Pt required max  verbal cues to produce throat clear. Pt's attempts at throat clear were weak and ineffective at changing vocal quality. Pt required max verbal and tactile cues for rate and multiple swallows. Pt with increased swallow delay when attempting second swallow. Pt's with effective mastication of dysphagia 2 trials with only mod I cues. Pt with ~25 intelligibility with max verbal cues provided. Pt with difficulty initiating and attending to left and required max verbal and tactile cues (possibly d/t sleepiness). Pt with sustained attention to new basic board game of 2 to 3 minutes given max verbal cues. Pt left in room with daughter and all needs within reach. Continue plan of care.   Function:  Eating Eating   Modified Consistency Diet: Yes Eating Assist Level: More than reasonable amount of time;Set up assist for;Supervision or verbal cues;Helper checks for pocketed food   Eating Set Up Assist For: Opening containers       Cognition Comprehension Comprehension assist level: Understands basic 75 - 89% of the time/ requires cueing 10 - 24% of the time  Expression   Expression assist level: Expresses basic 25 - 49% of the time/requires cueing 50 - 75% of the time. Uses single words/gestures.  Social Interaction Social Interaction assist level: Interacts appropriately 25 - 49% of time - Needs frequent redirection.  Problem Solving Problem solving assist level: Solves basic 25 - 49% of the time - needs direction more than half the time to initiate, plan or complete simple activities  Memory Memory assist level: Recognizes or recalls 25 - 49% of the time/requires cueing 50 - 75% of the time  Pain    Therapy/Group: Individual Therapy  Hetty Linhart 10/19/2015, 12:32 PM

## 2015-10-20 ENCOUNTER — Inpatient Hospital Stay (HOSPITAL_COMMUNITY): Payer: Medicare Other | Admitting: Speech Pathology

## 2015-10-20 ENCOUNTER — Inpatient Hospital Stay (HOSPITAL_COMMUNITY): Payer: Medicare Other | Admitting: Physical Therapy

## 2015-10-20 ENCOUNTER — Inpatient Hospital Stay (HOSPITAL_COMMUNITY): Payer: Medicare Other

## 2015-10-20 LAB — GLUCOSE, CAPILLARY
GLUCOSE-CAPILLARY: 96 mg/dL (ref 65–99)
GLUCOSE-CAPILLARY: 117 mg/dL — AB (ref 65–99)
Glucose-Capillary: 177 mg/dL — ABNORMAL HIGH (ref 65–99)
Glucose-Capillary: 136 mg/dL — ABNORMAL HIGH (ref 65–99)

## 2015-10-20 LAB — BASIC METABOLIC PANEL
Anion gap: 9 (ref 5–15)
BUN: 21 mg/dL — AB (ref 6–20)
CALCIUM: 9.7 mg/dL (ref 8.9–10.3)
CO2: 31 mmol/L (ref 22–32)
CREATININE: 1.1 mg/dL (ref 0.61–1.24)
Chloride: 103 mmol/L (ref 101–111)
GFR calc Af Amer: >60 mL/min (ref >60)
GLUCOSE: 120 mg/dL — AB (ref 65–99)
Potassium: 4 mmol/L (ref 3.5–5.1)
SODIUM: 143 mmol/L (ref 135–145)

## 2015-10-20 LAB — CBC WITH DIFFERENTIAL/PLATELET
Basophils Absolute: 0 10*3/uL (ref 0.0–0.1)
Basophils Relative: 1 %
EOS ABS: 0.2 10*3/uL (ref 0.0–0.7)
EOS PCT: 2 %
HCT: 40.3 % (ref 39.0–52.0)
Hemoglobin: 13.4 g/dL (ref 13.0–17.0)
LYMPHS ABS: 0.9 10*3/uL (ref 0.7–4.0)
LYMPHS PCT: 13 %
MCH: 30.2 pg (ref 26.0–34.0)
MCHC: 33.3 g/dL (ref 30.0–36.0)
MCV: 90.8 fL (ref 78.0–100.0)
MONO ABS: 0.8 10*3/uL (ref 0.1–1.0)
Monocytes Relative: 12 %
Neutro Abs: 5.2 10*3/uL (ref 1.7–7.7)
Neutrophils Relative %: 72 %
PLATELETS: 195 10*3/uL (ref 150–400)
RBC: 4.44 MIL/uL (ref 4.22–5.81)
RDW: 14.8 % (ref 11.5–15.5)
WBC: 7.1 10*3/uL (ref 4.0–10.5)

## 2015-10-20 MED ORDER — LISINOPRIL 20 MG PO TABS
20.0000 mg | ORAL_TABLET | Freq: Two times a day (BID) | ORAL | Status: DC
Start: 2015-10-20 — End: 2015-10-27
  Administered 2015-10-20 – 2015-10-27 (×15): 20 mg via ORAL
  Filled 2015-10-20 (×16): qty 1

## 2015-10-20 NOTE — Progress Notes (Signed)
Placed patient on home CPAP for the night  

## 2015-10-20 NOTE — Progress Notes (Addendum)
[place] PHYSICAL MEDICINE & REHABILITATION     PROGRESS NOTE  Subjective/Complaints:  Patient seen this morning sitting up in bed. He states he was restless and could not sleep last night and as result is tired this morning. He does not recall if he took his sleeping medication.  ROS: Denies CP, SOB, n/v/d.   Objective: Vital Signs: Blood pressure 146/76, pulse 65, temperature 97.7 F (36.5 C), temperature source Oral, resp. rate 18, height  (1.753 m), weight 58.7 kg (129 lb 6.6 oz), SpO2 99 %. No results found.  Recent Labs  10/20/15 0500  WBC 7.1  HGB 13.4  HCT 40.3  PLT 195    Recent Labs  10/20/15 0500  NA 143  K 4.0  CL 103  GLUCOSE 120*  BUN 21*  CREATININE 1.10  CALCIUM 9.7   CBG (last 3)   Recent Labs  10/19/15 1645 10/19/15 2117 10/20/15 0647  GLUCAP 175* 138* 96    Wt Readings from Last 3 Encounters:  10/20/15 58.7 kg (129 lb 6.6 oz)  10/07/15 66.2 kg (145 lb 15.1 oz)  02/20/14 63.957 kg (141 lb)    Physical Exam:  BP 146/76 mmHg  Pulse 65  Temp(Src) 97.7 F (36.5 C) (Oral)  Resp 18  Ht  (1.753 m)  Wt 58.7 kg (129 lb 6.6 oz)  BMI 19.10 kg/m2  SpO2 99% HENT: Normocephalic, Atraumatic.  Eyes: EOM and Conj are normal.  Cardiovascular: Normal rate and regular rhythm. Grade 3 systolic ejection murmur Respiratory: Effort normal and breath sounds normal. No respiratory distress.  GI: Soft. Bowel sounds are normal. He exhibits no distension.  Neurological:  Patient is alert and oriented x2 with increased time and cues.   Dysarthria.  Left facial droop  Motor: LUE: 1/5 elbow flexion/extension, 0/5 distally mAS  1/4 shoulder abduction, 2/4 elbow flexion, 1+/4 wrist flexion, 1+/4 ankle dorsiflexion LLE: hip flexion 3-/5, distally 0/5 RUE: 5/5 proximal to distal LLE: 4+/5 proximal to distal Skin: Skin is warm and dry.   Assessment/Plan: 1. 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 Physiatrist is providing close team supervision and 24 hour management of active medical problems listed below. Physiatrist and rehab team continue to assess barriers to discharge/monitor patient progress toward functional and medical goals.  Function:  Bathing Bathing position   Position: Shower  Bathing parts Body parts bathed by patient: Left arm, Chest, Abdomen, Front perineal area, Right upper leg Body parts bathed by helper: Right arm, Buttocks, Left upper leg, Right lower leg, Left lower leg, Back  Bathing assist Assist Level: Touching or steadying assistance(Pt > 75%)   Set up : To obtain items  Upper Body Dressing/Undressing Upper body dressing   What is the patient wearing?: Pull over shirt/dress     Pull over shirt/dress - Perfomed by patient: Thread/unthread right sleeve Pull over shirt/dress - Perfomed by helper: Thread/unthread left sleeve, Put head through opening, Pull shirt over trunk        Upper body assist Assist Level: Touching or steadying assistance(Pt > 75%)      Lower Body Dressing/Undressing Lower body dressing   What is the patient wearing?: Pants, Ted Hose, Non-skid slipper socks Underwear - Performed by patient: Thread/unthread right underwear leg Underwear - Performed by helper: Thread/unthread left underwear leg, Pull underwear up/down Pants- Performed by patient: Thread/unthread right pants leg Pants- Performed by helper: Thread/unthread  left pants leg, Pull pants up/down, Fasten/unfasten pants   Non-skid slipper socks- Performed by helper: Don/doff right sock, Don/doff left sock     Shoes - Performed by patient: Fasten right, Fasten left, Don/doff right shoe Shoes - Performed by helper: Don/doff left shoe       TED Hose - Performed by helper: Don/doff right TED hose, Don/doff left TED hose  Lower body assist Assist for lower body  dressing: Touching or steadying assistance (Pt > 75%)      Toileting Toileting Toileting activity did not occur: No continent bowel/bladder event Toileting steps completed by patient: Adjust clothing prior to toileting Toileting steps completed by helper: Adjust clothing prior to toileting, Performs perineal hygiene, Adjust clothing after toileting Toileting Assistive Devices: Toilet aid  Toileting assist Assist level: Touching or steadying assistance (Pt.75%)   Transfers Chair/bed transfer   Chair/bed transfer method: Squat pivot Chair/bed transfer assist level: Touching or steadying assistance (Pt > 75%) Chair/bed transfer assistive device: Armrests, Bedrails     Locomotion Ambulation Ambulation activity did not occur: Safety/medical concerns   Max distance: 55 Assist level: Maximal assist (Pt 25 - 49%)   Wheelchair Wheelchair activity did not occur: Safety/medical concerns Type: Manual Max wheelchair distance: 40 Assist Level: Supervision or verbal cues  Cognition Comprehension Comprehension assist level: Understands basic 75 - 89% of the time/ requires cueing 10 - 24% of the time  Expression Expression assist level: Expresses basic 25 - 49% of the time/requires cueing 50 - 75% of the time. Uses single words/gestures.  Social Interaction Social Interaction assist level: Interacts appropriately 25 - 49% of time - Needs frequent redirection.  Problem Solving Problem solving assist level: Solves basic 25 - 49% of the time - needs direction more than half the time to initiate, plan or complete simple activities  Memory Memory assist level: Recognizes or recalls 25 - 49% of the time/requires cueing 50 - 75% of the time     Medical Problem List and Plan: 1. 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 DVT Prophylaxis/Anticoagulation: Subcutaneous  heparin. Monitor platelet count and any signs of bleeding 3. Pain Management: Tylenol as needed 4. Mood/dementia: Discuss cognitive baseline with family. Resumed Namenda 5. Neuropsych: This patient is not capable of making decisions on his own behalf. 6. Skin/Wound Care: Routine skin checks 7. Fluids/Electrolytes/Nutrition: Routine I&O's   Dysphagia 1, Nectar thick 8. Hypertension.   Will change Cozaar and captopril to lisinopril 20 twice a day to address overnight elevated values as well  Will continue to monitor 9. Diabetes mellitus of peripheral neuropathy. Hemoglobin A1c 8.1. Check blood sugars before meals and at bedtime.   Patient on Glucophage ex RR 500 mg twice daily prior to admission -   Glucophage 500 XR daily started on 3/10 and will increase to home dose if necessary  Amaryl increased to 2mg  on 3/5  Fasting CBG 96 this AM  10. Hyperlipidemia. Lipitor 11. BPH. Resumed Proscar 5 mg daily 12. History of gout.Colchicine. Monitor for any gout flareup 13. AKI: Improving  Cr.  1.14 on 3/10  Will order labs for Friday  14. Spasticity  Zanaflex increased to 8mg  TID on 3/13  Splints 15. Insomnia  Trazodone 25 when necessary  Encourage patient to provide medication if patient restless and unable to sleep as patient is not always aware to request.  LOS (Days) 13 A FACE TO FACE  EVALUATION WAS PERFORMED  Ankit Karis Juba 10/20/2015 7:58 AM

## 2015-10-20 NOTE — Progress Notes (Signed)
Pt removed CPAP.

## 2015-10-20 NOTE — Progress Notes (Signed)
Speech Language Pathology Daily Session Note  Patient Details  Name: Roger [name] MRN: [id number] Date of Birth: 1930/10/04  Today's Date: 10/20/2015 SLP Individual Time: 2595-6387 SLP Individual Time Calculation (min): 30 min  Short Term Goals: Week 2: SLP Short Term Goal 1 (Week 2): Patient will consume current diet with minimal overt s/s of aspiration with Min A verbal cues for use of swallowing compensatory strategies.  SLP Short Term Goal 2 (Week 2): Patient will consume trials of thin liquids via tsp with minimal overt s/s of aspiration in less than 30% of trials to assess readniess for repeat MBS with Min A verbal cues for use of swallow strategies . SLP Short Term Goal 3 (Week 2): Patient will demonstrate efficient mastication of Dys. 2 textures over 3 consecutive sessions with minimal oral residue and no overt s/s of aspiration with Mod A verbal cues prior to upgrade.  SLP Short Term Goal 4 (Week 2): Patient will utilize an increased vocal intensity and over-articulation at the word and phrase level with Mod A verbal cues to maximize intelligibility to 75%.  SLP Short Term Goal 5 (Week 2): Patient will demonstrate sustained attention to functional tasks for 10 minutes with Min A verbal cues for redirection.  SLP Short Term Goal 6 (Week 2): Patient will attend to left field of enviornment during functional tasks with Min A verbal cues.   Skilled Therapeutic Interventions: Skilled treatment session focused on addressing dysphagia goals. SLP facilitated session by providing set-up of Dys.2 textures and nectar-thick liquids via teaspoon.  Patient required Mod cues to refrain from talking with PO in his mouth due to wife arriving during session due to cough with first two bites.  Once wife and patient had been educated on importance of focus on advanced PO trials SLP faded cues to Min assist vebral and visual cues to recall use of double swallows with each bite or sip and an intermittent throat  clear.  Patient demonstrated effective mastication with trace oral residue and no further overt s/s of aspiration.  Recommend advanced texture tray trial with SLP.      Function:  Eating Eating Eating activity did not occur: N/A Modified Consistency Diet: Yes Eating Assist Level: More than reasonable amount of time;Set up assist for;Supervision or verbal cues;Helper checks for pocketed food   Eating Set Up Assist For: Opening containers       Cognition Comprehension Comprehension assist level: Understands basic 75 - 89% of the time/ requires cueing 10 - 24% of the time  Expression   Expression assist level: Expresses basic 50 - 74% of the time/requires cueing 25 - 49% of the time. Needs to repeat parts of sentences.  Social Interaction Social Interaction assist level: Interacts appropriately 50 - 74% of the time - May be physically or verbally inappropriate.  Problem Solving Problem solving assist level: Solves basic 25 - 49% of the time - needs direction more than half the time to initiate, plan or complete simple activities  Memory Memory assist level: Recognizes or recalls 25 - 49% of the time/requires cueing 50 - 75% of the time    Pain Pain Assessment Pain Assessment: No/denies pain  Therapy/Group: Individual Therapy  Roger [name]., CCC-SLP [phone]  Roger [name] 10/20/2015, 4:52 PM

## 2015-10-20 NOTE — Progress Notes (Addendum)
Physical Therapy Session Note  Patient Details  Name: Roger [name] MRN: [id number] Date of Birth: 1931/08/01  Today's Date: 10/20/2015 PT Individual Time: 1302-1430 PT Individual Time Calculation (min): 88 min   Short Term Goals: Week 2:  PT Short Term Goal 1 (Week 2): Pt will be able to transfer supine<>sit with Mod A with use of bed features. PT Short Term Goal 2 (Week 2): Pt will be able to transfer sit<>stand with Min A. PT Short Term Goal 3 (Week 2): Pt will be able to perform stand pivot tranfsfers with Mod A. PT Short Term Goal 4 (Week 2): Pt will be able to tolerate sitting at EOB with Min A and able to correct LOB & posture.  Skilled Therapeutic Interventions/Progress Updates:    Pt received in w/c & agreeable to PT. Pt transferred sit>stand from w/c with cuing for hand placement on armrests & Min A. Pt ambulated 15 ft + 10 ft with hemiwalker in RUE and Max A. PT assisted pt with upright posture, weight shifting to R, and pelvic rotation to allow pt to advance LLE easier. Pt with difficulty maintaining upright posture, advancing LLE and sequencing during gait training today. Pt transferred sit>stand in parallel bars and utilized mirror for visual feedback for upright posture & weight shifting to neutral & to R. Pt with difficulty maintaining upright posture as he wanted to look at floor, as well as difficulty shifting weight to R and maintaining it there. Pt transferred squat>pivot w/c>mat table with maximum cuing for anterior weight shift prior to standing, as pt was attempting to stand first. Pt required Mod A for squat pivot, then for positioning in L sidelying. In that position pt completed RLE hip abduction 3 sets of 15 reps with tactile & verbal cuing for proper technique. Attempted to have pt take rest break in prone position but pt c/o neck pain. Pt performed supine bridging 2 sets of 15 reps with ball between knees to focus on adductor muscles. Pt with improving L glute strength  during exercise. Pt assisted with squat>pivot mat table>w/c in same manner as noted above. After multiple inquiries pt informed PT that he was "nearly exhausted"; PT educated pt on importance of communication between pt & PT to allow PT to know how pt is feeling. At end of session pt was assisted w/c>supine in bed in same manner as noted above. Pt left with all needs met & daughter present to supervise.  Therapy Documentation Precautions:  Precautions Precautions: Fall Restrictions Weight Bearing Restrictions: No  Vital Signs: Therapy Vitals BP: 115/61 mmHg Pain: Pain Assessment Pain Assessment: No/denies pain   See Function Navigator for Current Functional Status.   Therapy/Group: Individual Therapy  Waunita Schooner 10/20/2015, 3:33 PM

## 2015-10-20 NOTE — Progress Notes (Signed)
Placed patient on home CPAP unit for the night.  

## 2015-10-20 NOTE — Progress Notes (Signed)
Speech Language Pathology Daily Session Note  Patient Details  Name: Roger [name] MRN: [id number] Date of Birth: [birth year]  Today's Date: 10/20/2015 SLP Individual Time: 5462-7035 SLP Individual Time Calculation (min): 27 min  Short Term Goals: Week 2: SLP Short Term Goal 1 (Week 2): Patient will consume current diet with minimal overt s/s of aspiration with Min A verbal cues for use of swallowing compensatory strategies.  SLP Short Term Goal 2 (Week 2): Patient will consume trials of thin liquids via tsp with minimal overt s/s of aspiration in less than 30% of trials to assess readniess for repeat MBS with Min A verbal cues for use of swallow strategies . SLP Short Term Goal 3 (Week 2): Patient will demonstrate efficient mastication of Dys. 2 textures over 3 consecutive sessions with minimal oral residue and no overt s/s of aspiration with Mod A verbal cues prior to upgrade.  SLP Short Term Goal 4 (Week 2): Patient will utilize an increased vocal intensity and over-articulation at the word and phrase level with Mod A verbal cues to maximize intelligibility to 75%.  SLP Short Term Goal 5 (Week 2): Patient will demonstrate sustained attention to functional tasks for 10 minutes with Min A verbal cues for redirection.  SLP Short Term Goal 6 (Week 2): Patient will attend to left field of enviornment during functional tasks with Min A verbal cues.   Skilled Therapeutic Interventions:  Pt was seen for skilled ST targeting communication goals.  SLP facilitated the session with a structured verbal task targeting intelligibility at the word-phrase level. Pt was able to produce minimally distinct word pairs intelligibly with min assist verbal cues for increased vocal intensity; however, with increased task challenge of use of targeted word at the phrase-sentence level, pt required up to mod assist verbal cues for intelligibility.  With a familiar, functional phrase such as "I love you," or "I need help" pt  was able to achieve adequate vocal intensity to be heard across his room with min assist verbal cues.  SLP introduced a 5 point rating scale to increase self regulation of intelligibility in conversations which pt was able to utilize correctly as evidenced by correlation with SLP's scores of his speech performance in 50% of opportunities with mod assist question cues.  Pt left in bed with call bell within reach and daughter at bedside. Continue per current plan of care.    Function:  Eating Eating                 Cognition Comprehension Comprehension assist level: Understands basic 75 - 89% of the time/ requires cueing 10 - 24% of the time  Expression   Expression assist level: Expresses basic 50 - 74% of the time/requires cueing 25 - 49% of the time. Needs to repeat parts of sentences.  Social Interaction Social Interaction assist level: Interacts appropriately 50 - 74% of the time - May be physically or verbally inappropriate.  Problem Solving Problem solving assist level: Solves basic 25 - 49% of the time - needs direction more than half the time to initiate, plan or complete simple activities  Memory Memory assist level: Recognizes or recalls 25 - 49% of the time/requires cueing 50 - 75% of the time    Pain Pain Assessment Pain Assessment: No/denies pain  Therapy/Group: Individual Therapy  Quiana Cobaugh, Melanee Spry 10/20/2015, 12:54 PM

## 2015-10-20 NOTE — Progress Notes (Signed)
Occupational Therapy Session Note  Patient Details  Name: Roger [name] MRN: [id number] Date of Birth: [birth year]  Today's Date: 10/20/2015 OT Individual Time: 1100-1200 OT Individual Time Calculation (min): 60 min    Short Term Goals: Week 2:  OT Short Term Goal 1 (Week 2): Pt will demo ability to completed self-feeding with only setup to open containers OT Short Term Goal 2 (Week 2): Pt will don pull over shirt sitting supported with min assist to manage left UE OT Short Term Goal 2 - Progress (Week 2): Met OT Short Term Goal 3 (Week 2): Pt will complete sit<>stand with mod assist in prep for lower body dressing OT Short Term Goal 4 (Week 2): Pt will lace right pant leg with steadying assist to maintain sitting balance OT Short Term Goal 5 (Week 2): Pt will complete 4 of 5 grooming tasks with setup and supervision  Skilled Therapeutic Interventions/Progress Updates:    Pt resting in bed upon arrival with daughter present.  Pt stated he had already "washed up" and changed into clean clothing.  This was confirmed by his daughter.  Pt performed supine>sit EOB in preparation for transfer to w/c.  Pt required max A and max verbal cues for sequencing/techniques to complete task.  Pt required max A for squat pivot transfer to w/c.  Pt transitioned to gym and transferred to mat and engaged in static and dynamic sitting tasks while reaching with RUE outside of BOS to remove and place cards on card board.  Pt requires max verbal cues to maintain sitting balance at midline with tendency to push/lean to his left.  Pt can self correct with max verbal cues.  Pt exhibits limited balance reactions when leaning/reaching to his right and left to place/retrieve objects.  Attempted sit<>stand from edge of mat with patient exhibiting overuse of RUE and RLE resulting in patient pushing to his left.  Attempted to engage patient in anterior weight shifts while seated, again resulting in patient pushing to his left.  Pt  exhibited success with reaching to his left and above shoulder height but does not exhibit pelvic weight shifts with task.  Pt transferred to w/c and returned to his room.  Pt remained in his w/c with daughter present and half lap tray in place.  Therapy Documentation Precautions:  Precautions Precautions: Fall Restrictions Weight Bearing Restrictions: No Pain: Pain Assessment Pain Assessment: No/denies pain ADL: ADL ADL Comments: see Functional Assessment Tool  See Function Navigator for Current Functional Status.   Therapy/Group: Individual Therapy  Leroy Libman 10/20/2015, 12:03 PM

## 2015-10-21 ENCOUNTER — Inpatient Hospital Stay (HOSPITAL_COMMUNITY): Payer: Medicare Other | Admitting: Physical Therapy

## 2015-10-21 ENCOUNTER — Inpatient Hospital Stay (HOSPITAL_COMMUNITY): Payer: Medicare Other | Admitting: Occupational Therapy

## 2015-10-21 ENCOUNTER — Inpatient Hospital Stay (HOSPITAL_COMMUNITY): Payer: Medicare Other | Admitting: Speech Pathology

## 2015-10-21 DIAGNOSIS — F05 Delirium due to known physiological condition: Secondary | ICD-10-CM

## 2015-10-21 DIAGNOSIS — R682 Dry mouth, unspecified: Secondary | ICD-10-CM

## 2015-10-21 LAB — GLUCOSE, CAPILLARY
GLUCOSE-CAPILLARY: 115 mg/dL — AB (ref 65–99)
GLUCOSE-CAPILLARY: 130 mg/dL — AB (ref 65–99)
GLUCOSE-CAPILLARY: 169 mg/dL — AB (ref 65–99)
Glucose-Capillary: 178 mg/dL — ABNORMAL HIGH (ref 65–99)

## 2015-10-21 NOTE — Progress Notes (Signed)
Physical Therapy Session Note  Patient Details  Name: Roger [name] MRN: [id number] Date of Birth: February 23, 1931  Today's Date: 10/21/2015 PT Individual Time: 1100-1158 AND 1630-1700 PT Individual Time Calculation (min): 58 min and 30 min   Short Term Goals: Week 1:  PT Short Term Goal 1 (Week 1): Pt will be able to transfer supine<>sit with Min A with use of bed features. PT Short Term Goal 1 - Progress (Week 1): Revised due to lack of progress PT Short Term Goal 2 (Week 1): Pt will be able to transfer sit<>stand with Min A. PT Short Term Goal 2 - Progress (Week 1): Progressing toward goal PT Short Term Goal 3 (Week 1): Pt will be able to perform stand pivot tranfsfers with Mod A. PT Short Term Goal 3 - Progress (Week 1): Progressing toward goal PT Short Term Goal 4 (Week 1): Pt will be able to tolerate sitting at EOB with Min A and able to correct LOB & posture. PT Short Term Goal 4 - Progress (Week 1): Progressing toward goal Week 2:  PT Short Term Goal 1 (Week 2): Pt will be able to transfer supine<>sit with Mod A with use of bed features. PT Short Term Goal 2 (Week 2): Pt will be able to transfer sit<>stand with Min A. PT Short Term Goal 3 (Week 2): Pt will be able to perform stand pivot tranfsfers with Mod A. PT Short Term Goal 4 (Week 2): Pt will be able to tolerate sitting at EOB with Min A and able to correct LOB & posture.  Skilled Therapeutic Interventions/Progress Updates:    Session 1: Patient received sitting EOB in rehab gym with OT. Traade off from OT. Sitting balance x 10 minutes up to 3 minutes with 1 UE support without PT assist to correct LOB. Standing tolerance with improved weight shift over R LE x 5 minutes. Gait training with rail in hall, 30x 2 with Max A for improved posture, L LE swing through and improved weight shift on R LE. Gait training with Hemi walker x 51f with Max A from PT for improved L LE swing through, proper sequencing on hemi walker, improved stability  through LLE and increased weight shift to R LE. Sitting balance and reach to L side outside BOS with min A at trunk for increased weight shift. Throughout treatment session patient performed squat pivot with Mod-Max A.  [facility] daughter present in room.    Session 2:  Patient received supine in bed. Patient performed Bed mobility with Mod A. Stand pivot transfer with Mod A and cues for improved positioning. PT propelled WC to rehab gym for time management. Squat pivot to mat with Mod A. Scooting EOB with min A and constant cueing for proper LE placement and improved weight shifting. Bed mobility for sit<>supine with mod-max A. Roll L and R with pillow between knees, Mod A from PT and cues for proper trunk and pelvic movement and increased use of L LE for roll R. In sidelying, hip flexion with knee extension followed by hip extension and knee flexion with light resistance from PT x 12, cues for improved ROM and increased quality of movement. Stand pivot to WC with mod A from PT. [facility] other needs met.  Therapy Documentation Precautions:  Precautions Precautions: Fall Restrictions Weight Bearing Restrictions: No General:   Vital Signs: Therapy Vitals Temp: 98.7 F (37.1 C)  Temp Source: Oral Pulse Rate: 78 Resp: 18 BP: 125/62 mmHg Patient Position (if appropriate): Lying Oxygen Therapy SpO2: 98 % O2 Device: Not Delivered Pain: Pain Assessment Pain Assessment: No/denies pain  See Function Navigator for Current Functional Status.   Therapy/Group: Individual Therapy  Lorie Phenix 10/21/2015, 5:05 PM

## 2015-10-21 NOTE — Progress Notes (Signed)
Pt sleep with CPAP on until 0400. At 0400 pt removed CPAP and went back to sleep. At 0530 patient set off bed alarm with an attempt OOB. Pt A&Ox 1.  RN and NT reoriented patient, and repositioned. Bed alarm set to middle setting. Will continue to monitor. Rudie Meyer, RN

## 2015-10-21 NOTE — Progress Notes (Signed)
[facility] PHYSICAL MEDICINE & REHABILITATION     PROGRESS NOTE  Subjective/Complaints:  Patient seen sitting up in bed this morning with his daughter present. Daughter and nursing note episode of confusion overnight. He did receive trazodone, but also was woken up every  3 hours.  ROS: + Dry mouth. Denies CP, SOB, n/v/d.   Objective: Vital Signs: Blood pressure 138/73, pulse 76, temperature 97.3 F (36.3 C), temperature source Oral, resp. rate 17, height 5\' 9"  (1.753 m), weight 58.7 kg (129 lb 6.6 oz), SpO2 100 %. No results found.  Recent Labs  10/20/15 0500  WBC 7.1  HGB 13.4  HCT 40.3  PLT 195    Recent Labs  10/20/15 0500  NA 143  K 4.0  CL 103  GLUCOSE 120*  BUN 21*  CREATININE 1.10  CALCIUM 9.7   CBG (last 3)   Recent Labs  10/20/15 1618 10/20/15 2048 10/21/15 0643  GLUCAP 117* 136* 115*    Wt Readings from Last 3 Encounters:  10/20/15 58.7 kg (129 lb 6.6 oz)  10/07/15 66.2 kg (145 lb 15.1 oz)  02/20/14 63.957 kg (141 lb)    Physical Exam:  BP 138/73 mmHg  Pulse 76  Temp(Src) 97.3 F (36.3 C) (Oral)  Resp 17  Ht 5\' 9"  (1.753 m)  Wt 58.7 kg (129 lb 6.6 oz)  BMI 19.10 kg/m2  SpO2 100% HENT: Normocephalic, Atraumatic.  Eyes: EOM and Conj are normal.  Cardiovascular: Normal rate and regular rhythm. Grade 3 systolic ejection murmur Respiratory: Effort normal and breath sounds normal. No respiratory distress.  GI: Soft. Bowel sounds are normal. He exhibits no distension.  Neurological:  Patient is alert and oriented x2 with increased time and cues.   Dysarthria.  Left facial droop  Motor: LUE: 1/5 elbow flexion/extension, 0/5 distally mAS  1/4 shoulder abduction, 2/4 elbow flexion, 1+/4 wrist flexion, 1+/4 ankle dorsiflexion RLE: 4/5 proximal to distal RUE: 5/5 proximal to distal LLE: hip flexion 3/5, Knee extension 2/5, ankle dorsi/plantar flexion 0/5 Skin: Skin is warm and dry.   Assessment/Plan: 1. 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 Physiatrist is providing close team supervision and 24 hour management of active medical problems listed below. Physiatrist and rehab team continue to assess barriers to discharge/monitor patient progress toward functional and medical goals.  Function:  Bathing Bathing position   Position: Shower  Bathing parts Body parts bathed by patient: Left arm, Chest, Abdomen, Front perineal area, Right upper leg Body parts bathed by helper: Right arm, Buttocks, Left upper leg, Right lower leg, Left lower leg, Back  Bathing assist Assist Level: Touching or steadying assistance(Pt > 75%)   Set up : To obtain items  Upper Body Dressing/Undressing Upper body dressing   What is the patient wearing?: Pull over shirt/dress     Pull over shirt/dress - Perfomed by patient: Thread/unthread right sleeve Pull over shirt/dress - Perfomed by helper: Thread/unthread left sleeve, Put head through opening, Pull shirt over trunk        Upper body assist Assist Level: Touching or steadying assistance(Pt > 75%)      Lower Body Dressing/Undressing Lower body dressing   What is the patient wearing?: Pants, Ted Hose, Non-skid slipper socks Underwear - Performed by patient: Thread/unthread right underwear leg Underwear - Performed by helper: Thread/unthread left underwear leg, Pull underwear up/down Pants- Performed by patient: Thread/unthread right pants leg  Pants- Performed by helper: Thread/unthread left pants leg, Pull pants up/down, Fasten/unfasten pants   Non-skid slipper socks- Performed by helper: Don/doff right sock, Don/doff left sock     Shoes - Performed by patient: Fasten right, Fasten left, Don/doff right shoe Shoes - Performed by helper: Don/doff left shoe       TED Hose - Performed by helper: Don/doff right TED hose, Don/doff left TED  hose  Lower body assist Assist for lower body dressing: Touching or steadying assistance (Pt > 75%)      Toileting Toileting Toileting activity did not occur: No continent bowel/bladder event Toileting steps completed by patient: Adjust clothing prior to toileting Toileting steps completed by helper: Adjust clothing prior to toileting, Performs perineal hygiene, Adjust clothing after toileting Toileting Assistive Devices: Toilet aid  Toileting assist Assist level: Touching or steadying assistance (Pt.75%)   Transfers Chair/bed transfer   Chair/bed transfer method: Squat pivot Chair/bed transfer assist level: Moderate assist (Pt 50 - 74%/lift or lower) Chair/bed transfer assistive device: Armrests     Locomotion Ambulation Ambulation activity did not occur: Safety/medical concerns   Max distance: 15 Assist level: Maximal assist (Pt 25 - 49%)   Wheelchair Wheelchair activity did not occur: Safety/medical concerns Type: Manual Max wheelchair distance: 40 Assist Level: Supervision or verbal cues  Cognition Comprehension Comprehension assist level: Understands basic 75 - 89% of the time/ requires cueing 10 - 24% of the time  Expression Expression assist level: Expresses basic 50 - 74% of the time/requires cueing 25 - 49% of the time. Needs to repeat parts of sentences.  Social Interaction Social Interaction assist level: Interacts appropriately 50 - 74% of the time - May be physically or verbally inappropriate.  Problem Solving Problem solving assist level: Solves basic 25 - 49% of the time - needs direction more than half the time to initiate, plan or complete simple activities  Memory Memory assist level: Recognizes or recalls 25 - 49% of the time/requires cueing 50 - 75% of the time     Medical Problem List and Plan: 1. 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 DVT Prophylaxis/Anticoagulation: Subcutaneous heparin. Monitor platelet count and any signs of bleeding 3. Pain Management: Tylenol as needed 4. Mood/dementia: Discuss cognitive baseline with family. Resumed Namenda 5. Neuropsych: This patient is not capable of making decisions on his own behalf. 6. Skin/Wound Care: Routine skin checks 7. Fluids/Electrolytes/Nutrition: Routine I&O's   Dysphagia 1, Nectar thick 8. Hypertension.   Will change Cozaar and captopril to lisinopril 20 twice a day to address overnight elevated values as well  Will continue to monitor 9. Diabetes mellitus of peripheral neuropathy. Hemoglobin A1c 8.1. Check blood sugars before meals and at bedtime.   Patient on Glucophage ex RR 500 mg twice daily prior to admission -   Glucophage 500 XR daily started on 3/10 and will increase to home dose if necessary  Amaryl increased to  on 3/5  Fasting CBG 115 this AM  10. Hyperlipidemia. Lipitor 11. BPH. Resumed Proscar 5 mg daily 12. History of gout.Colchicine. Monitor for any gout flareup 13. AKI: Improving  Cr.  1.14 on 3/10  Will order labs for Friday  14. Spasticity  Zanaflex increased to  TID on 3/13  Splints 15. Insomnia  Trazodone 25 when necessary  Patient with confusion the night of 3/16, unclear medication related or related to repeatedly being woken  up. Will try trazodone again tonight and monitor for side effects. 16. Dry mouth  Will order a humidifier.  LOS (Days) 14 A FACE TO FACE EVALUATION WAS PERFORMED  Ankit Karis Juba 10/21/2015 8:05 AM

## 2015-10-21 NOTE — Progress Notes (Signed)
Speech Language Pathology Daily Session Note  Patient Details  Name: Roger [name] MRN: [id number] Date of Birth: 01/07/1931  Today's Date: 10/21/2015 SLP Individual Time: 1400-1445 SLP Individual Time Calculation (min): 45 min  Short Term Goals: Week 2: SLP Short Term Goal 1 (Week 2): Patient will consume current diet with minimal overt s/s of aspiration with Min A verbal cues for use of swallowing compensatory strategies.  SLP Short Term Goal 2 (Week 2): Patient will consume trials of thin liquids via tsp with minimal overt s/s of aspiration in less than 30% of trials to assess readniess for repeat MBS with Min A verbal cues for use of swallow strategies . SLP Short Term Goal 3 (Week 2): Patient will demonstrate efficient mastication of Dys. 2 textures over 3 consecutive sessions with minimal oral residue and no overt s/s of aspiration with Mod A verbal cues prior to upgrade.  SLP Short Term Goal 4 (Week 2): Patient will utilize an increased vocal intensity and over-articulation at the word and phrase level with Mod A verbal cues to maximize intelligibility to 75%.  SLP Short Term Goal 5 (Week 2): Patient will demonstrate sustained attention to functional tasks for 10 minutes with Min A verbal cues for redirection.  SLP Short Term Goal 6 (Week 2): Patient will attend to left field of enviornment during functional tasks with Min A verbal cues.   Skilled Therapeutic Interventions: Pt seen for speech and dysphagia therapy. Pt required min- mod verbal cueing and modeling to acheive adequate vocal intensity for functional communication. Cueing required for adequate strength throat clear during speech and swallow activities.  Trialed with Dys 2/3  food tiems. Increased time required to achieve oral clearance and occasional verbal cueing for multiple swallows. 1 episode of cough. Will proceed with Dys 2 trial tray in the morning. Discussed POC with pt and his daughter; both parties verbalized agreement.    Function:  Eating Eating   Modified Consistency Diet: Yes Eating Assist Level: More than reasonable amount of time;Set up assist for;Supervision or verbal cues;Helper checks for pocketed food   Eating Set Up Assist For: Opening containers       Cognition Comprehension Comprehension assist level: Understands basic 75 - 89% of the time/ requires cueing 10 - 24% of the time  Expression   Expression assist level: Expresses basic 50 - 74% of the time/requires cueing 25 - 49% of the time. Needs to repeat parts of sentences.  Social Interaction Social Interaction assist level: Interacts appropriately 90% of the time - Needs monitoring or encouragement for participation or interaction.  Problem Solving Problem solving assist level: Solves basic 25 - 49% of the time - needs direction more than half the time to initiate, plan or complete simple activities  Memory Memory assist level: Recognizes or recalls 25 - 49% of the time/requires cueing 50 - 75% of the time    Pain Pain Assessment Pain Assessment: No/denies pain  Therapy/Group: Individual Therapy  Rocky Crafts 10/21/2015, 3:53 PM

## 2015-10-21 NOTE — Progress Notes (Signed)
Occupational Therapy Session Note  Patient Details  Name: Roger [name] MRN: [id number] Date of Birth: 1931/06/03  Today's Date: 10/21/2015 OT Individual Time: 1000-1102 OT Individual Time Calculation (min): 62 min    Short Term Goals: Week 2:  OT Short Term Goal 1 (Week 2): Pt will demo ability to completed self-feeding with only setup to open containers OT Short Term Goal 2 (Week 2): Pt will don pull over shirt sitting supported with min assist to manage left UE OT Short Term Goal 2 - Progress (Week 2): Met OT Short Term Goal 3 (Week 2): Pt will complete sit<>stand with mod assist in prep for lower body dressing OT Short Term Goal 4 (Week 2): Pt will lace right pant leg with steadying assist to maintain sitting balance OT Short Term Goal 5 (Week 2): Pt will complete 4 of 5 grooming tasks with setup and supervision  Skilled Therapeutic Interventions/Progress Updates:    Began session by having pt transition to EOB with max assist in order to donn shoes.  He was able to complete rolling to the left side with min assist but needed max assist to transition to sitting on EOB.  Increased pushing to the left side in sitting requiring overall mod assist to regain his balance.  Worked on donning shoes in sitting EOB, with pt crossing the LLE over the right to place and velcro the left shoe.  He was able to complete with mod assist for balance and max assist for crossing the LLE and maintaining it.  Mod assist needed for balance to complete the right shoe as well.  Transferred squat pivot to the wheelchair with mod assist on the right side.  Rolled pt to the therapy gym where session focused on mobilizations of the trunk, pelvis, and hip.  Pt with limited mobility in all these areas.  Place in supine for mobility and stretching of the hips as well as for working on rolling with initiation of head flexion and activation of abdominals.  Pt is able to roll to the left with supervision and to the right with mod  facilitation.  Min assist to transition back to sitting from left sidelying as well.  Worked in sitting and lateral reaching to the right side to increased trunk mobility and activation on the left as well as to decrease pushing with the RUE and RLE.  Pt with increased difficulty actively reaching to the right beyond BOS secondary to tightness and limited mobility in his rib cage.  Worked on rotation to the left to place clothespins after reaching to grasp them with the RUE as well.  Increased pushing with the RLE also noted with falling to the left as a result.  Max assist needed to keep balance and return to midline.  Pt left with PT on mat for next session.    Therapy Documentation Precautions:  Precautions Precautions: Fall Restrictions Weight Bearing Restrictions: No  Pain: Pain Assessment Pain Assessment: No/denies pain ADL: See Function Navigator for Current Functional Status.   Therapy/Group: Individual Therapy  Logen Fowle OTR/L 10/21/2015, 12:51 PM

## 2015-10-22 ENCOUNTER — Inpatient Hospital Stay (HOSPITAL_COMMUNITY): Payer: Medicare Other

## 2015-10-22 ENCOUNTER — Inpatient Hospital Stay (HOSPITAL_COMMUNITY): Payer: Medicare Other | Admitting: Speech Pathology

## 2015-10-22 ENCOUNTER — Inpatient Hospital Stay (HOSPITAL_COMMUNITY): Payer: Medicare Other | Admitting: Physical Therapy

## 2015-10-22 LAB — BASIC METABOLIC PANEL
Anion gap: 8 (ref 5–15)
BUN: 35 mg/dL — ABNORMAL HIGH (ref 6–20)
CHLORIDE: 105 mmol/L (ref 101–111)
CO2: 30 mmol/L (ref 22–32)
CREATININE: 1.49 mg/dL — AB (ref 0.61–1.24)
Calcium: 9.3 mg/dL (ref 8.9–10.3)
GFR, EST AFRICAN AMERICAN: 49 mL/min — AB (ref >60)
GFR, EST NON AFRICAN AMERICAN: 42 mL/min — AB (ref >60)
Glucose, Bld: 121 mg/dL — ABNORMAL HIGH (ref 65–99)
Potassium: 4.2 mmol/L (ref 3.5–5.1)
SODIUM: 143 mmol/L (ref 135–145)

## 2015-10-22 LAB — CBC WITH DIFFERENTIAL/PLATELET
BASOS PCT: 0 %
Basophils Absolute: 0 10*3/uL (ref 0.0–0.1)
Eosinophils Absolute: 0.2 10*3/uL (ref 0.0–0.7)
Eosinophils Relative: 3 %
HEMATOCRIT: 36.9 % — AB (ref 39.0–52.0)
HEMOGLOBIN: 11.7 g/dL — AB (ref 13.0–17.0)
LYMPHS ABS: 1.2 10*3/uL (ref 0.7–4.0)
LYMPHS PCT: 21 %
MCH: 29.1 pg (ref 26.0–34.0)
MCHC: 31.7 g/dL (ref 30.0–36.0)
MCV: 91.8 fL (ref 78.0–100.0)
MONO ABS: 0.6 10*3/uL (ref 0.1–1.0)
MONOS PCT: 10 %
NEUTROS ABS: 3.8 10*3/uL (ref 1.7–7.7)
NEUTROS PCT: 66 %
Platelets: 186 10*3/uL (ref 150–400)
RBC: 4.02 MIL/uL — ABNORMAL LOW (ref 4.22–5.81)
RDW: 15 % (ref 11.5–15.5)
WBC: 5.8 10*3/uL (ref 4.0–10.5)

## 2015-10-22 LAB — GLUCOSE, CAPILLARY
GLUCOSE-CAPILLARY: 102 mg/dL — AB (ref 65–99)
GLUCOSE-CAPILLARY: 162 mg/dL — AB (ref 65–99)
Glucose-Capillary: 116 mg/dL — ABNORMAL HIGH (ref 65–99)
Glucose-Capillary: 174 mg/dL — ABNORMAL HIGH (ref 65–99)

## 2015-10-22 NOTE — Procedures (Signed)
Pt on home vent with home settings.

## 2015-10-22 NOTE — Progress Notes (Signed)
Physical Therapy Session Note  Patient Details  Name: Roger [name] MRN: [id number] Date of Birth: 03-26-1931  Today's Date: 10/22/2015 PT Individual Time: 5053-9767 PT Individual Time Calculation (min): 72 min   Short Term Goals: Week 1:  PT Short Term Goal 1 (Week 1): Pt will be able to transfer supine<>sit with Min A with use of bed features. PT Short Term Goal 1 - Progress (Week 1): Revised due to lack of progress PT Short Term Goal 2 (Week 1): Pt will be able to transfer sit<>stand with Min A. PT Short Term Goal 2 - Progress (Week 1): Progressing toward goal PT Short Term Goal 3 (Week 1): Pt will be able to perform stand pivot tranfsfers with Mod A. PT Short Term Goal 3 - Progress (Week 1): Progressing toward goal PT Short Term Goal 4 (Week 1): Pt will be able to tolerate sitting at EOB with Min A and able to correct LOB & posture. PT Short Term Goal 4 - Progress (Week 1): Progressing toward goal Week 2:  PT Short Term Goal 1 (Week 2): Pt will be able to transfer supine<>sit with Mod A with use of bed features. PT Short Term Goal 2 (Week 2): Pt will be able to transfer sit<>stand with Min A. PT Short Term Goal 3 (Week 2): Pt will be able to perform stand pivot tranfsfers with Mod A. PT Short Term Goal 4 (Week 2): Pt will be able to tolerate sitting at EOB with Min A and able to correct LOB & posture.  Skilled Therapeutic Interventions/Progress Updates:    Patient received sitting in bed with family present. Patient performed bed mobility to come to sitting EOB with mod A. Mod A for stand pivot to WC from bed. Pt transported to solarium in [facility] for energy conservation. WC mobility in Solarium for 275ft on carpeted and tile floor with supervision A and moderate cues for improved propulsion technique with R UE and R LE. Patient transported to rehab gym in [facility] for time management. Chair<>mat transfer with squat pivot and Mod A from PT. Sit<>supine with mod A for trunk and L LE, as well as cues  for improved use of UE with movement. Scooting to [facility] with 7 attempts using BLE for push; PT anchored feet for improved success.   Supine therex.  BLE bridges x 6  Bridge with L LE and R LE straight x 5  LLE SAQx 6 Clam shell against manual resistance x 8  Adduction against manual resistance x 8   For all supine therex, PT provided min A for improved safety as well as cues for decreased compensation from trunk, increased ROM, and improved quality of movement.   Standing trails with weight shift L and R. PT provided Mod A to prevent buckling of L LE and to improve upright posture. 3 sets of 3 minutes.   Patient instructed in WC propulsion in hall for 75 ft with moderate cues for increased use of LE for steering.  Patient returned to room in [facility] with call bell within reach and daughter present.   Therapy Documentation Precautions:  Precautions Precautions: Fall Restrictions Weight Bearing Restrictions: No General:   Vital Signs:   Pain: Pain Assessment Pain Assessment: No/denies pain Pain Score: 0-No pain   See Function Navigator for Current Functional Status.   Therapy/Group: Individual Therapy  Golden Pop 10/22/2015, 5:23 PM

## 2015-10-22 NOTE — Progress Notes (Signed)
PHYSICAL MEDICINE & REHABILITATION     PROGRESS NOTE  Subjective/Complaints:  Pt sitting up in his chair this AM working with SLP.  His cough appears to be improved.  Will inquire about humidifier due to concerns for spread of infection.  He and his daughter state the pt slept much better last night.    ROS: Denies CP, SOB, n/v/d.   Objective: Vital Signs: Blood pressure 127/74, pulse 67, temperature 98.2 F (36.8 C), temperature source Oral, resp. rate 16, height  (1.753 m), weight 58.7 kg (129 lb 6.6 oz), SpO2 99 %. No results found.  Recent Labs  10/20/15 0500 10/22/15 0530  WBC 7.1 5.8  HGB 13.4 11.7*  HCT 40.3 36.9*  PLT 195 186    Recent Labs  10/20/15 0500 10/22/15 0530  NA 143 143  K 4.0 4.2  CL 103 105  GLUCOSE 120* 121*  BUN 21* 35*  CREATININE 1.10 1.49*  CALCIUM 9.7 9.3   CBG (last 3)   Recent Labs  10/21/15 1717 10/21/15 2047 10/22/15 0638  GLUCAP 130* 169* 102*    Wt Readings from Last 3 Encounters:  10/20/15 58.7 kg (129 lb 6.6 oz)  10/07/15 66.2 kg (145 lb 15.1 oz)  02/20/14 63.957 kg (141 lb)    Physical Exam:  BP 127/74 mmHg  Pulse 67  Temp(Src) 98.2 F (36.8 C) (Oral)  Resp 16  Ht  (1.753 m)  Wt 58.7 kg (129 lb 6.6 oz)  BMI 19.10 kg/m2  SpO2 99% HENT: Normocephalic, Atraumatic.  Eyes: EOM and Conj are normal.  Cardiovascular: Normal rate and regular rhythm. Grade 3 systolic ejection murmur Respiratory: Effort normal and breath sounds normal. No respiratory distress.  GI: Soft. Bowel sounds are normal. He exhibits no distension.  Neurological:  Patient is alert.   Dysarthria.  Left facial droop  Motor: LUE: 1/5 elbow flexion/extension, 0/5 distally mAS  1/4 shoulder abduction, 1+/4 elbow flexion, 1/4 wrist flexion, 1+/4 ankle dorsiflexion RLE: 4/5 proximal to distal RUE: 5/5 proximal to distal LLE: hip flexion 3/5, Knee extension 2/5, ankle dorsi/plantar flexion 0/5 Skin: Skin is warm and dry.    Assessment/Plan: 1. 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 Physiatrist is providing close team supervision and 24 hour management of active medical problems listed below. Physiatrist and rehab team continue to assess barriers to discharge/monitor patient progress toward functional and medical goals.  Function:  Bathing Bathing position   Position: Shower  Bathing parts Body parts bathed by patient: Left arm, Chest, Abdomen, Front perineal area, Right upper leg Body parts bathed by helper: Right arm, Buttocks, Left upper leg, Right lower leg, Left lower leg, Back  Bathing assist Assist Level: Touching or steadying assistance(Pt > 75%)   Set up : To obtain items  Upper Body Dressing/Undressing Upper body dressing   What is the patient wearing?: Pull over shirt/dress     Pull over shirt/dress - Perfomed by patient: Thread/unthread right sleeve Pull over shirt/dress - Perfomed by helper: Thread/unthread left sleeve, Put head through opening, Pull shirt over trunk        Upper body assist Assist Level: Touching or steadying assistance(Pt > 75%)      Lower Body Dressing/Undressing Lower body dressing   What is the patient wearing?: Pants, Ted Hose, Non-skid slipper socks Underwear - Performed by patient: Thread/unthread right underwear leg Underwear - Performed by  helper: Thread/unthread left underwear leg, Pull underwear up/down Pants- Performed by patient: Thread/unthread right pants leg Pants- Performed by helper: Thread/unthread left pants leg, Pull pants up/down, Fasten/unfasten pants   Non-skid slipper socks- Performed by helper: Don/doff right sock, Don/doff left sock     Shoes - Performed by patient: Fasten right, Fasten left, Don/doff right shoe Shoes - Performed by helper: Don/doff right shoe, Don/doff left shoe, Fasten  right, Fasten left       TED Hose - Performed by helper: Don/doff right TED hose, Don/doff left TED hose  Lower body assist Assist for lower body dressing: Touching or steadying assistance (Pt > 75%)      Toileting Toileting Toileting activity did not occur: No continent bowel/bladder event Toileting steps completed by patient: Adjust clothing prior to toileting Toileting steps completed by helper: Adjust clothing prior to toileting, Performs perineal hygiene, Adjust clothing after toileting Toileting Assistive Devices: Toilet aid  Toileting assist Assist level: Touching or steadying assistance (Pt.75%)   Transfers Chair/bed transfer   Chair/bed transfer method: Stand pivot Chair/bed transfer assist level: Moderate assist (Pt 50 - 74%/lift or lower) Chair/bed transfer assistive device: Armrests     Locomotion Ambulation Ambulation activity did not occur: Safety/medical concerns   Max distance: 44ft Assist level: Maximal assist (Pt 25 - 49%)   Wheelchair Wheelchair activity did not occur: Safety/medical concerns Type: Manual Max wheelchair distance: 40 Assist Level: Supervision or verbal cues  Cognition Comprehension Comprehension assist level: Understands basic 75 - 89% of the time/ requires cueing 10 - 24% of the time  Expression Expression assist level: Expresses basic 50 - 74% of the time/requires cueing 25 - 49% of the time. Needs to repeat parts of sentences.  Social Interaction Social Interaction assist level: Interacts appropriately 90% of the time - Needs monitoring or encouragement for participation or interaction.  Problem Solving Problem solving assist level: Solves basic 25 - 49% of the time - needs direction more than half the time to initiate, plan or complete simple activities  Memory Memory assist level: Recognizes or recalls 25 - 49% of the time/requires cueing 50 - 75% of the time     Medical Problem List and Plan: 1. 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 DVT Prophylaxis/Anticoagulation: Subcutaneous heparin. Monitor platelet count and any signs of bleeding 3. Pain Management: Tylenol as needed 4. Mood/dementia: Discuss cognitive baseline with family. Resumed Namenda 5. Neuropsych: This patient is not capable of making decisions on his own behalf. 6. Skin/Wound Care: Routine skin checks 7. Fluids/Electrolytes/Nutrition: Routine I&O's   Dysphagia 2, Nectar thick 8. Hypertension.   Will change Cozaar and captopril to lisinopril 20 twice a day to address overnight elevated values as well  Improved at present 9. Diabetes mellitus of peripheral neuropathy. Hemoglobin A1c 8.1. Check blood sugars before meals and at bedtime.   Patient on Glucophage ex RR 500 mg twice daily prior to admission -   Glucophage 500 XR daily started on 3/10 and will increase to home dose if necessary  Amaryl increased to 2mg  on 3/5  Fasting CBG 102 this AM  10. Hyperlipidemia. Lipitor 11. BPH. Resumed Proscar 5 mg daily 12. History of gout.Colchicine. Monitor for any gout flareup 13. AKI:   Cr.  1.49 on 3/17  Will cont to monitor and encourage PO intake.  Upgrade in diet will hopefully improve intake  Will order labs  for Monday and consider IVF if necessary 14. Spasticity  Zanaflex increased to 8mg  TID on 3/13  Splints 15. Insomnia  Trazodone 25 when necessary 16. Dry mouth  Will follow up regarding humidified.  LOS (Days) 15 A FACE TO FACE EVALUATION WAS PERFORMED  Ankit Karis Juba 10/22/2015 8:42 AM

## 2015-10-22 NOTE — Progress Notes (Signed)
Speech Language Pathology Weekly Progress and Session Note  Patient Details  Name: Roger [name] MRN: [id number] Date of Birth: 09-Jul-1931  Beginning of progress report period: October 15, 2015 End of progress report period: October 22, 2015  Today's Date: 10/22/2015 SLP Individual Time: 0800-0900 SLP Individual Time Calculation (min): 60 min  Short Term Goals: Week 2: SLP Short Term Goal 1 (Week 2): Patient will consume current diet with minimal overt s/s of aspiration with Min A verbal cues for use of swallowing compensatory strategies.  SLP Short Term Goal 1 - Progress (Week 2): Progressing toward goal SLP Short Term Goal 2 (Week 2): Patient will consume trials of thin liquids via tsp with minimal overt s/s of aspiration in less than 30% of trials to assess readniess for repeat MBS with Min A verbal cues for use of swallow strategies . SLP Short Term Goal 2 - Progress (Week 2): Progressing toward goal SLP Short Term Goal 3 (Week 2): Patient will demonstrate efficient mastication of Dys. 2 textures over 3 consecutive sessions with minimal oral residue and no overt s/s of aspiration with Mod A verbal cues prior to upgrade.  SLP Short Term Goal 3 - Progress (Week 2): Met SLP Short Term Goal 4 (Week 2): Patient will utilize an increased vocal intensity and over-articulation at the word and phrase level with Mod A verbal cues to maximize intelligibility to 75%.  SLP Short Term Goal 4 - Progress (Week 2): Met SLP Short Term Goal 5 (Week 2): Patient will demonstrate sustained attention to functional tasks for 10 minutes with Min A verbal cues for redirection.  SLP Short Term Goal 5 - Progress (Week 2): Progressing toward goal SLP Short Term Goal 6 (Week 2): Patient will attend to left field of enviornment during functional tasks with Min A verbal cues.  SLP Short Term Goal 6 - Progress (Week 2): Met    New Short Term Goals: Week 3: SLP Short Term Goal 1 (Week 3): Patient will consume current diet  with minimal overt s/s of aspiration with Min A verbal cues for use of swallowing compensatory strategies.  SLP Short Term Goal 2 (Week 3): Patient will consume trials of thin liquids via tsp with minimal overt s/s of aspiration in less than 30% of trials to assess readniess for repeat MBS with Min A verbal cues for use of swallow strategies . SLP Short Term Goal 3 (Week 3): Patient will utilize an increased vocal intensity and over-articulation at the phrase and sentence level with Mod A verbal cues to maximize intelligibility to 75%.  SLP Short Term Goal 4 (Week 3): Patient will demonstrate sustained attention to functional tasks for 10 minutes with Min A verbal cues for redirection.   Weekly Progress Updates:  Pt has made progress with functional communication at the word to phrase level with emerging self-correction behavior when vocal intensity is inadequate. Diet upgraded to Dys 2 with nectars, but pt continues to require consistent verbal cueing throughout meals to observe swallow precautions. Encouraging strong throat clear.   Intensity: Minumum of 1-2 x/day, 30 to 90 minutes Frequency: 3 to 5 out of 7 days Duration/Length of Stay: 10/28/15 Treatment/Interventions: Cognitive remediation/compensation;Cueing hierarchy;Dysphagia/aspiration precaution training;Functional tasks;Patient/family education;Therapeutic Activities;Internal/external aids;Environmental controls   Daily Session  Skilled Therapeutic Interventions: Pt trialed with Dys 2 breakfast tray. Pt required verbal cueing to observe swallow precautions for greater than 50% of boluses. Pt utilized applesauce to clear any solid residual in mouth. Pt unable to consistently demonstrate appropriate small bolus sip  via cup, so will continue with teaspoon sip of nectars with upgraded diet to Dys 2. Pt demo'd improved vocal intensity at the word to phrase level, but continues to benefit from intermittent modeling to reinforce desired behavior.       Function:   Eating Eating   Modified Consistency Diet: Yes Eating Assist Level: More than reasonable amount of time;Set up assist for;Supervision or verbal cues   Eating Set Up Assist For: Opening containers       Cognition Comprehension Comprehension assist level: Understands basic 75 - 89% of the time/ requires cueing 10 - 24% of the time  Expression   Expression assist level: Expresses basic 50 - 74% of the time/requires cueing 25 - 49% of the time. Needs to repeat parts of sentences.  Social Interaction Social Interaction assist level: Interacts appropriately 90% of the time - Needs monitoring or encouragement for participation or interaction.  Problem Solving Problem solving assist level: Solves basic 25 - 49% of the time - needs direction more than half the time to initiate, plan or complete simple activities  Memory Memory assist level: Recognizes or recalls 25 - 49% of the time/requires cueing 50 - 75% of the time   General    Pain Pain Assessment Pain Assessment: No/denies pain Pain Score: 0-No pain  Therapy/Group: Individual Therapy  Vinetta Bergamo 10/22/2015, 3:51 PM

## 2015-10-22 NOTE — Plan of Care (Signed)
Problem: RH BLADDER ELIMINATION Goal: RH STG MANAGE BLADDER WITH ASSISTANCE STG Manage Bladder With mod Assistance  Outcome: Not Progressing Continues to require and request condom cath at [facility]  Problem: RH SKIN INTEGRITY Goal: RH STG MAINTAIN SKIN INTEGRITY WITH ASSISTANCE STG Maintain Skin Integrity With mod Assistance.  Outcome: Not Progressing Total assist with turning, elevating heels, etc.

## 2015-10-22 NOTE — Progress Notes (Signed)
Occupational Therapy Weekly Progress Note  Patient Details  Name: Roger [name] MRN: [id number] Date of Birth: 1931/01/18  Beginning of progress report period: October 15, 2015 End of progress report period: October 22, 2015  Today's Date: 10/22/2015 OT Individual [id number] 60 Minutes      Patient has met 4 of 5 short term goals.  Pt continues to demonstrate some progress maintaining supported sitting balance, attention and awareness, and sit<>stand, but requires assist with initiation, sequencing and problem-solving necessary to self-monitor, adjust and advance to greater independence with BADL.   Treatment goals acknowledge need for assist with all self-care, however family members are unable to provide assist at patient's current level of functioning.  Patient continues to demonstrate the following deficits: Impaired sitting balance, impaired static standing balance, impaired awareness, problem-solving,& sequencing, impaired transfer skills, impaired mobility and therefore will continue to benefit from skilled OT intervention to enhance overall performance to reduce care partner burden.  Patient progressing toward long term goals..  Continue plan of care.  OT Short Term Goals Week 1:  OT Short Term Goal 1 (Week 1): Pt will complete upper body dressing sitting at EOB with mod assist OT Short Term Goal 1 - Progress (Week 1): Partly met OT Short Term Goal 2 (Week 1): Pt will complete transfer to [facility] with mod assist OT Short Term Goal 2 - Progress (Week 1): Progressing toward goal OT Short Term Goal 3 (Week 1): Pt will maintian supported static standing balance during assisted lower body dressing at sink independently OT Short Term Goal 3 - Progress (Week 1): Partly met OT Short Term Goal 4 (Week 1): Pt will bathe 4 of 10 body parts with supervision after setup at sink OT Short Term Goal 4 - Progress (Week 1): Met OT Short Term Goal 5 (Week 1): Pt will complete 2 of 5 grooming task with min  vc for thoroughness OT Short Term Goal 5 - Progress (Week 1): Met Week 2:  OT Short Term Goal 1 (Week 2): Pt will demo ability to completed self-feeding with only setup to open containers OT Short Term Goal 1 - Progress (Week 2): Met OT Short Term Goal 2 (Week 2): Pt will don pull over shirt sitting supported with min assist to manage left UE OT Short Term Goal 2 - Progress (Week 2): Progressing toward goal OT Short Term Goal 3 (Week 2): Pt will complete sit<>stand with mod assist in prep for lower body dressing OT Short Term Goal 3 - Progress (Week 2): Met OT Short Term Goal 4 (Week 2): Pt will lace right pant leg with steadying assist to maintain sitting balance OT Short Term Goal 4 - Progress (Week 2): Met OT Short Term Goal 5 (Week 2): Pt will complete 4 of 5 grooming tasks with setup and supervision OT Short Term Goal 5 - Progress (Week 2): Met Week 3:  OT Short Term Goal 1 (Week 3): STG=LTG d/t short remaining LOS prior to transfer to SNF for extended rehab  Skilled Therapeutic Interventions/Progress Updates: ADL-retraining with focus on family education (daughter present), discharge planning, re-assessment of grooming/toileting, dressing skills, re-ed on sitting/standing balance deficits and compensations.   Pt received seated in w/c reporting earlier bathing at sink with daughter and receptive for assist with dressing.    OT educated pt and his daughter on status of second week goals with need to reassess pt's ability to manage and direct caregivers.   Pt completed upper body dressing with moderate vc to sequence and problem-solve,  min assist to lace left arm.  Daughter re-educated on benefit of using larger sized shirts to improve performance as pt is unable to overcome restrictions from medium-sized polo shirts w/o assistance.   Daughter provided verbal report on pt's performance with self-feeding and grooming.   Pt then requested toileting assist and completed transfer from w/c to toilet and  back with extra time, manual facilitation to place and block left foot, moderate vc to sequence, visual/tactile cues to reach for grab bar, moderate assist to weight-shift and initiate squat pivot transfer, and moderate steadying assist during pivot.   Pt able to maintain sitting balance with w/c placed to provide support forward, as needed.   Pt completed toileting with overall max assist, following cues as directed with good attention.   Following toileting, pt and his daughter report of awareness of transfer to [facility] rehab facility for continued care prior to returning home.   Daughter maintains a hope that her father will "walk" again; OT offered perspective on w/c use to include hemi- w/c to improve mobility.    Therapy Documentation Precautions:  Precautions Precautions: Fall Restrictions Weight Bearing Restrictions: No  Vital Signs: Therapy Vitals Temp: 98.2 F (36.8 C) Temp Source: Oral Pulse Rate: 67 Resp: 16 BP: 127/74 mmHg Patient Position (if appropriate): Lying Oxygen Therapy SpO2: 99 % O2 Device: Not Delivered   Pain: No/denies pain   ADL: ADL ADL Comments: see Functional Assessment Tool    See Function Navigator for Current Functional Status.   Therapy/Group: Individual Therapy  [place] 10/22/2015, 7:12 AM

## 2015-10-23 ENCOUNTER — Inpatient Hospital Stay (HOSPITAL_COMMUNITY): Payer: Medicare Other | Admitting: Physical Therapy

## 2015-10-23 LAB — GLUCOSE, CAPILLARY
GLUCOSE-CAPILLARY: 85 mg/dL (ref 65–99)
Glucose-Capillary: 231 mg/dL — ABNORMAL HIGH (ref 65–99)
Glucose-Capillary: 54 mg/dL — ABNORMAL LOW (ref 65–99)
Glucose-Capillary: 139 mg/dL — ABNORMAL HIGH (ref 65–99)
Glucose-Capillary: 60 mg/dL — ABNORMAL LOW (ref 65–99)
Glucose-Capillary: 186 mg/dL — ABNORMAL HIGH (ref 65–99)

## 2015-10-23 MED ORDER — TRAZODONE HCL 50 MG PO TABS
25.0000 mg | ORAL_TABLET | Freq: Every day | ORAL | Status: DC
  Administered 2015-10-23 – 2015-10-24 (×2): 25 mg via ORAL
  Filled 2015-10-23 (×2): qty 1

## 2015-10-23 NOTE — Progress Notes (Signed)
Physical Therapy Session Note  Patient Details  Name: Roger [name] MRN: [id number] Date of Birth: September 16, 1930  Today's Date: 10/23/2015 PT Individual Time: 1445-1515 PT Individual Time Calculation (min): 30 min   Short Term Goals: Week 1:  PT Short Term Goal 1 (Week 1): Pt will be able to transfer supine<>sit with Min A with use of bed features. PT Short Term Goal 1 - Progress (Week 1): Revised due to lack of progress PT Short Term Goal 2 (Week 1): Pt will be able to transfer sit<>stand with Min A. PT Short Term Goal 2 - Progress (Week 1): Progressing toward goal PT Short Term Goal 3 (Week 1): Pt will be able to perform stand pivot tranfsfers with Mod A. PT Short Term Goal 3 - Progress (Week 1): Progressing toward goal PT Short Term Goal 4 (Week 1): Pt will be able to tolerate sitting at EOB with Min A and able to correct LOB & posture. PT Short Term Goal 4 - Progress (Week 1): Progressing toward goal Week 2:  PT Short Term Goal 1 (Week 2): Pt will be able to transfer supine<>sit with Mod A with use of bed features. PT Short Term Goal 2 (Week 2): Pt will be able to transfer sit<>stand with Min A. PT Short Term Goal 3 (Week 2): Pt will be able to perform stand pivot tranfsfers with Mod A. PT Short Term Goal 4 (Week 2): Pt will be able to tolerate sitting at EOB with Min A and able to correct LOB & posture.  Skilled Therapeutic Interventions/Progress Updates:    Patient received sitting up in bed with daughter and guests present. Patient performed bed mobility for semirecumbent to sitting EOB with Mob A from PT to the L. Stand pivot transfer with mod A to WC with cues for improved posture. Patient transported to rehab gym for time management. Stand pivot transfer to Mat to the L with mod A. Patient instructed in scooting EOB to the L with use of R UE and LE with min A and cues for LE positioning and increase forward weight shift. Sitting balance with mirror, up to 4 minutes without UE support and  only min cues for improved postural control. Sitting balance without mirror, up to 2 minutes without UE support and moderate cues required from PT to use surrounding to gage upright posture. Transfer training with daughter to R and L with Mod/max A. 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 Cues for improved L knee blocking technique and improved hand hold to increase safety of transfer. Patient returned to room in [facility] and left with call bell within reach.   Therapy Documentation Precautions:  Precautions Precautions: Fall Restrictions Weight Bearing Restrictions: No General:   Vital Signs: Therapy Vitals Temp: 97.4 F (36.3 C) Temp Source: Oral Pulse Rate: 75 Resp: 18 BP: (!) 154/72 mmHg Patient Position (if appropriate): Lying Oxygen Therapy SpO2: 99 % O2 Device: Not Delivered Pain: Pain Assessment Pain Assessment: No/denies pain Pain Score: 0-No pain  See Function Navigator for Current Functional Status.   Therapy/Group: Individual Therapy  Golden Pop 10/23/2015, 5:38 PM

## 2015-10-23 NOTE — Progress Notes (Addendum)
[place] PHYSICAL MEDICINE & REHABILITATION     PROGRESS NOTE  Subjective/Complaints:  Sitting up eating breakfast. Didn't sleep as well as he would have hoped but did sleep. A little restless. Apparently 50mg  dose worked for him but made him drowsy in the AM  ROS: Denies CP, SOB, n/v/d.   Objective: Vital Signs: Blood pressure 164/67, pulse 79, temperature 97.6 F (36.4 C), temperature source Oral, resp. rate 18, height 5\' 9"  (1.753 m), weight 58.7 kg (129 lb 6.6 oz), SpO2 100 %. No results found.  Recent Labs  10/22/15 0530  WBC 5.8  HGB 11.7*  HCT 36.9*  PLT 186    Recent Labs  10/22/15 0530  NA 143  K 4.2  CL 105  GLUCOSE 121*  BUN 35*  CREATININE 1.49*  CALCIUM 9.3   CBG (last 3)   Recent Labs  10/22/15 1632 10/22/15 2044 10/23/15 0636  GLUCAP 116* 174* 85    Wt Readings from Last 3 Encounters:  10/20/15 58.7 kg (129 lb 6.6 oz)  10/07/15 66.2 kg (145 lb 15.1 oz)  02/20/14 63.957 kg (141 lb)    Physical Exam:  BP 164/67 mmHg  Pulse 79  Temp(Src) 97.6 F (36.4 C) (Oral)  Resp 18  Ht 5\' 9"  (1.753 m)  Wt 58.7 kg (129 lb 6.6 oz)  BMI 19.10 kg/m2  SpO2 100% HENT: Normocephalic, Atraumatic.  Eyes: EOM and Conj are normal.  Cardiovascular: Normal rate and regular rhythm. Grade 3 systolic ejection murmur Respiratory: Effort normal and breath sounds normal. No respiratory distress.  GI: Soft. Bowel sounds are normal. He exhibits no distension.  Neurological:  Patient is alert.   Dysarthric.   Left facial droop. Decreased oro-motor control  Motor: LUE: 1/5 elbow flexion/extension, 0/5 distally mAS  1/4 shoulder abduction, 1+/4 elbow flexion, 1/4 wrist flexion, 1+/4 ankle dorsiflexion RLE: 4/5 proximal to distal RUE: 5/5 proximal to distal LLE: hip flexion 3/5, Knee extension 2/5, ankle dorsi/plantar flexion 0/5 Skin: Skin is warm and dry.   Assessment/Plan: 1. 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 Physiatrist is providing close team supervision and 24 hour management of active medical problems listed below. Physiatrist and rehab team continue to assess barriers to discharge/monitor patient progress toward functional and medical goals.  Function:  Bathing Bathing position   Position: Wheelchair/chair at sink  Bathing parts Body parts bathed by patient: Left arm, Chest, Abdomen, Left upper leg, Front perineal area Body parts bathed by helper: Right arm, Buttocks, Left upper leg, Left lower leg, Back  Bathing assist Assist Level: Touching or steadying assistance(Pt > 75%)   Set up : To obtain items, To adjust water temperature, To open containers  Upper Body Dressing/Undressing Upper body dressing   What is the patient wearing?: Pull over shirt/dress     Pull over shirt/dress - Perfomed by patient: Thread/unthread right sleeve, Put head through opening, Pull shirt over trunk Pull over shirt/dress - Perfomed by helper: Thread/unthread left sleeve        Upper body assist Assist Level: Touching or steadying assistance(Pt > 75%)      Lower Body Dressing/Undressing Lower body dressing   What is the patient wearing?: Pants, Ted Hose, Shoes Underwear - Performed by patient: Thread/unthread right underwear leg Underwear - Performed by helper: Thread/unthread right underwear leg, Thread/unthread left underwear leg, Pull underwear up/down Pants- Performed by patient: Thread/unthread right pants leg  Pants- Performed by helper: Thread/unthread right pants leg, Thread/unthread left pants leg, Pull pants up/down, Fasten/unfasten pants   Non-skid slipper socks- Performed by helper: Don/doff right sock, Don/doff left sock     Shoes - Performed by patient: Fasten right, Fasten left Shoes - Performed by helper: Don/doff right shoe, Don/doff left shoe       TED Hose -  Performed by helper: Don/doff right TED hose, Don/doff left TED hose  Lower body assist Assist for lower body dressing: Touching or steadying assistance (Pt > 75%)      Toileting Toileting Toileting activity did not occur: No continent bowel/bladder event Toileting steps completed by patient: Adjust clothing prior to toileting Toileting steps completed by helper: Adjust clothing prior to toileting, Performs perineal hygiene, Adjust clothing after toileting Toileting Assistive Devices: Toilet aid  Toileting assist Assist level: Touching or steadying assistance (Pt.75%)   Transfers Chair/bed transfer   Chair/bed transfer method: Squat pivot, Stand pivot Chair/bed transfer assist level: Moderate assist (Pt 50 - 74%/lift or lower) Chair/bed transfer assistive device: Armrests     Locomotion Ambulation Ambulation activity did not occur: Safety/medical concerns   Max distance: 36ft Assist level: Maximal assist (Pt 25 - 49%)   Wheelchair Wheelchair activity did not occur: Safety/medical concerns Type: Manual Max wheelchair distance: 231ft Assist Level: Supervision or verbal cues  Cognition Comprehension Comprehension assist level: Understands basic 75 - 89% of the time/ requires cueing 10 - 24% of the time  Expression Expression assist level: Expresses basic 50 - 74% of the time/requires cueing 25 - 49% of the time. Needs to repeat parts of sentences.  Social Interaction Social Interaction assist level: Interacts appropriately 90% of the time - Needs monitoring or encouragement for participation or interaction.  Problem Solving Problem solving assist level: Solves basic 25 - 49% of the time - needs direction more than half the time to initiate, plan or complete simple activities  Memory Memory assist level: Recognizes or recalls 25 - 49% of the time/requires cueing 50 - 75% of the time     Medical Problem List and Plan: 1. Left side weakness, dysphagia/dysarthria secondary to right  lenticular nucleus and corona radiata infarcts on 2/27  Cont CIR  Fluoxetine DC'd due to interaction with Plavix 2. DVT Prophylaxis/Anticoagulation: Subcutaneous heparin. Monitor platelet count and any signs of bleeding 3. Pain Management: Tylenol as needed 4. Mood/dementia: Discuss cognitive baseline with family. Resumed Namenda 5. Neuropsych: This patient is not capable of making decisions on his own behalf. 6. Skin/Wound Care: Routine skin checks 7. Fluids/Electrolytes/Nutrition: Routine I&O's   Dysphagia 2, Nectar thick 8. Hypertension.    lisinopril 20 twice a day to address overnight elevated values as well  Improved control 9. Diabetes mellitus of peripheral neuropathy. Hemoglobin A1c 8.1. Check blood sugars before meals and at bedtime.   Patient on Glucophage ex RR 500 mg twice daily prior to admission -   Glucophage 500 XR daily started on 3/10 and will increase to home dose if necessary  Amaryl increased to  on 3/5  Fasting CBG 85 this AM --good control at present 10. Hyperlipidemia. Lipitor 11. BPH. Resumed Proscar 5 mg daily 12. History of gout.Colchicine. Monitor for any gout flareup 13. AKI:   Cr.  1.49 on 3/17  Will cont to monitor and encourage PO intake.  Upgrade in diet will hopefully improve intake  Pending labs Monday. consider IVF if necessary 14. Spasticity  Zanaflex increased to  TID on 3/13  Splints 15. Insomnia  Trazodone  25mg --will schedule tonight at 2100 16. Dry mouth     LOS (Days) 16 A FACE TO FACE EVALUATION WAS PERFORMED  Khadeeja Elden T 10/23/2015 9:07 AM

## 2015-10-23 NOTE — Progress Notes (Signed)
Hypoglycemic Event  CBG: 56  Treatment: 15 GM carbohydrate snack  Symptoms: None  Follow-up CBG: Time:1545 CBG Result:139  Possible Reasons for Event: Unknown  Comments/MD notified: Dr. Becky Sax, [facility]

## 2015-10-24 ENCOUNTER — Inpatient Hospital Stay (HOSPITAL_COMMUNITY): Payer: Medicare Other

## 2015-10-24 LAB — GLUCOSE, CAPILLARY
GLUCOSE-CAPILLARY: 151 mg/dL — AB (ref 65–99)
GLUCOSE-CAPILLARY: 155 mg/dL — AB (ref 65–99)
Glucose-Capillary: 129 mg/dL — ABNORMAL HIGH (ref 65–99)
Glucose-Capillary: 139 mg/dL — ABNORMAL HIGH (ref 65–99)

## 2015-10-24 MED ORDER — GLIMEPIRIDE 4 MG PO TABS
4.0000 mg | ORAL_TABLET | Freq: Every day | ORAL | Status: DC
  Administered 2015-10-25 – 2015-10-27 (×3): 4 mg via ORAL
  Filled 2015-10-24 (×3): qty 1

## 2015-10-24 NOTE — Plan of Care (Signed)
Problem: RH BLADDER ELIMINATION Goal: RH STG MANAGE BLADDER WITH ASSISTANCE STG Manage Bladder With mod Assistance  Outcome: Not Progressing Condom cath at [facility]

## 2015-10-24 NOTE — Progress Notes (Signed)
[place] PHYSICAL MEDICINE & REHABILITATION     PROGRESS NOTE  Subjective/Complaints:  Slept better. Eating breakfast this morning fairly vigorously. Denies pain  ROS: Denies CP, SOB, n/v/d.   Objective: Vital Signs: Blood pressure 164/73, pulse 68, temperature 97.6 F (36.4 C), temperature source Oral, resp. rate 18, height  (1.753 m), weight 58.7 kg (129 lb 6.6 oz), SpO2 98 %. No results found.  Recent Labs  10/22/15 0530  WBC 5.8  HGB 11.7*  HCT 36.9*  PLT 186    Recent Labs  10/22/15 0530  NA 143  K 4.2  CL 105  GLUCOSE 121*  BUN 35*  CREATININE 1.49*  CALCIUM 9.3   CBG (last 3)   Recent Labs  10/23/15 1715 10/23/15 2048 10/24/15 0651  GLUCAP 139* 186* 129*    Wt Readings from Last 3 Encounters:  10/20/15 58.7 kg (129 lb 6.6 oz)  10/07/15 66.2 kg (145 lb 15.1 oz)  02/20/14 63.957 kg (141 lb)    Physical Exam:  BP 164/73 mmHg  Pulse 68  Temp(Src) 97.6 F (36.4 C) (Oral)  Resp 18  Ht  (1.753 m)  Wt 58.7 kg (129 lb 6.6 oz)  BMI 19.10 kg/m2  SpO2 98% HENT: Normocephalic, Atraumatic.  Eyes: EOM and Conj are normal.  Cardiovascular: Normal rate and regular rhythm. Grade 3 systolic ejection murmur Respiratory: Effort normal and breath sounds normal. No respiratory distress.  GI: Soft. Bowel sounds are normal. He exhibits no distension.  Neurological:  Patient is alert.   Dysarthric.   Left facial droop. Decreased oro-motor control  Motor: LUE: 1/5 elbow flexion/extension, 0/5 distally mAS  1/4 shoulder abduction, 1+/4 elbow flexion, 1/4 wrist flexion, 1+/4 ankle dorsiflexion RLE: 4/5 proximal to distal RUE: 5/5 proximal to distal LLE: hip flexion 3/5, Knee extension 2/5, ankle dorsi/plantar flexion 0/5 Skin: Skin is warm and dry.   Assessment/Plan: 1. 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 Physiatrist is providing close team supervision and 24 hour management of active medical problems listed below. Physiatrist and rehab team continue to assess barriers to discharge/monitor patient progress toward functional and medical goals.  Function:  Bathing Bathing position   Position: Wheelchair/chair at sink  Bathing parts Body parts bathed by patient: Left arm, Chest, Abdomen, Left upper leg, Front perineal area Body parts bathed by helper: Right arm, Buttocks, Left upper leg, Left lower leg, Back  Bathing assist Assist Level: Touching or steadying assistance(Pt > 75%)   Set up : To obtain items, To adjust water temperature, To open containers  Upper Body Dressing/Undressing Upper body dressing   What is the patient wearing?: Pull over shirt/dress     Pull over shirt/dress - Perfomed by patient: Thread/unthread right sleeve, Put head through opening, Pull shirt over trunk Pull over shirt/dress - Perfomed by helper: Thread/unthread left sleeve        Upper body assist Assist Level: Touching or steadying assistance(Pt > 75%)      Lower Body Dressing/Undressing Lower body dressing   What is the patient wearing?: Pants, Ted Hose, Shoes Underwear - Performed by patient: Thread/unthread right underwear leg Underwear - Performed by helper: Thread/unthread right underwear leg, Thread/unthread left underwear leg, Pull underwear up/down Pants- Performed by patient: Thread/unthread right pants leg Pants- Performed by helper: Thread/unthread right pants leg, Thread/unthread left pants leg, Pull pants up/down, Fasten/unfasten pants   Non-skid slipper socks-  Performed by helper: Don/doff right sock, Don/doff left sock     Shoes - Performed by patient: Fasten right, Fasten left Shoes - Performed by helper: Don/doff right shoe, Don/doff left shoe       TED Hose - Performed by helper: Don/doff right TED hose, Don/doff left TED hose  Lower body assist Assist for lower  body dressing: Touching or steadying assistance (Pt > 75%)      Toileting Toileting Toileting activity did not occur: No continent bowel/bladder event Toileting steps completed by patient: Adjust clothing prior to toileting Toileting steps completed by helper: Adjust clothing prior to toileting, Performs perineal hygiene, Adjust clothing after toileting Toileting Assistive Devices: Grab bar or rail  Toileting assist Assist level: Touching or steadying assistance (Pt.75%)   Transfers Chair/bed transfer   Chair/bed transfer method: Stand pivot Chair/bed transfer assist level: Moderate assist (Pt 50 - 74%/lift or lower) Chair/bed transfer assistive device: Armrests     Locomotion Ambulation Ambulation activity did not occur: Safety/medical concerns   Max distance: 23ft Assist level: Maximal assist (Pt 25 - 49%)   Wheelchair Wheelchair activity did not occur: Safety/medical concerns Type: Manual Max wheelchair distance: 255ft Assist Level: Supervision or verbal cues  Cognition Comprehension Comprehension assist level: Understands basic 75 - 89% of the time/ requires cueing 10 - 24% of the time  Expression Expression assist level: Expresses basic 75 - 89% of the time/requires cueing 10 - 24% of the time. Needs helper to occlude trach/needs to repeat words.  Social Interaction Social Interaction assist level: Interacts appropriately 90% of the time - Needs monitoring or encouragement for participation or interaction.  Problem Solving Problem solving assist level: Solves basic 25 - 49% of the time - needs direction more than half the time to initiate, plan or complete simple activities  Memory Memory assist level: Recognizes or recalls 25 - 49% of the time/requires cueing 50 - 75% of the time     Medical Problem List and Plan: 1. 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 DVT Prophylaxis/Anticoagulation: Subcutaneous heparin. Monitor platelet count and any signs of bleeding 3. Pain Management: Tylenol as needed 4. Mood/dementia: Discuss cognitive baseline with family. Resumed Namenda 5. Neuropsych: This patient is not capable of making decisions on his own behalf. 6. Skin/Wound Care: Routine skin checks 7. Fluids/Electrolytes/Nutrition: Routine I&O's   Dysphagia 2, Nectar thick 8. Hypertension.    lisinopril 20 twice a day to address overnight elevated values as well  Improved control 9. Diabetes mellitus of peripheral neuropathy. Hemoglobin A1c 8.1. Check blood sugars before meals and at bedtime.   Patient on Glucophage ex RR 500 mg twice daily prior to admission -   -sugar drop when higher readings are treated with ssi  Glucophage 250mg  bid started on 3/12.     Amaryl increase to 4mg  today  Fasting CBG 129 this AM --good control at present 10. Hyperlipidemia. Lipitor 11. BPH. Resumed Proscar 5 mg daily 12. History of gout.Colchicine. Monitor for any gout flareup 13. AKI:   Cr.  1.49 on 3/17  Will cont to monitor and encourage PO intake.  Upgrade in diet will hopefully improve intake  Pending labs Monday. Ate well yesterday. consider IVF if necessary 14. Spasticity  Zanaflex increased to 8mg  TID on 3/13  Splints 15. Insomnia  Trazodone 25mg -- scheduled 2100 dose effective 16. Dry mouth     LOS (Days)  17 A FACE TO FACE EVALUATION WAS PERFORMED  SWARTZ,ZACHARY T 10/24/2015 8:24 AM

## 2015-10-24 NOTE — Progress Notes (Signed)
Occupational Therapy Note  Patient Details  Name: Roger [name] MRN: [id number] Date of Birth: November 09, 1930  Today's Date: 10/24/2015 OT Individual Time: 1415-1445 OT Individual Time Calculation (min): 30 min   Pt denied pain Individual Therapy  Pt engaged in static and dynamic sitting balance tasks while seated unsupported on mat.  Tasks included leaning forward and to pt's right without UE support and then returning to upright position in midline.  Pt required mod verbal cues and min>mod tactile input/facilitation to complete tasks.  Pt also practiced sit<>stand and squat pivot transfers with focus on forward weight shifts and slow controlled movements.   Lavone Neri [facility] 10/24/2015, 4:04 PM

## 2015-10-25 ENCOUNTER — Inpatient Hospital Stay (HOSPITAL_COMMUNITY): Payer: Medicare Other | Admitting: Physical Therapy

## 2015-10-25 ENCOUNTER — Inpatient Hospital Stay (HOSPITAL_COMMUNITY): Payer: Medicare Other | Admitting: Speech Pathology

## 2015-10-25 ENCOUNTER — Inpatient Hospital Stay (HOSPITAL_COMMUNITY): Payer: Medicare Other

## 2015-10-25 LAB — BASIC METABOLIC PANEL
Anion gap: 10 (ref 5–15)
BUN: 22 mg/dL — AB (ref 6–20)
CHLORIDE: 103 mmol/L (ref 101–111)
CO2: 31 mmol/L (ref 22–32)
Calcium: 9.6 mg/dL (ref 8.9–10.3)
Creatinine, Ser: 1.34 mg/dL — ABNORMAL HIGH (ref 0.61–1.24)
GFR calc Af Amer: 55 mL/min — ABNORMAL LOW (ref >60)
GFR, EST NON AFRICAN AMERICAN: 48 mL/min — AB (ref >60)
GLUCOSE: 130 mg/dL — AB (ref 65–99)
POTASSIUM: 4.7 mmol/L (ref 3.5–5.1)
Sodium: 144 mmol/L (ref 135–145)

## 2015-10-25 LAB — CBC WITH DIFFERENTIAL/PLATELET
Basophils Absolute: 0 10*3/uL (ref 0.0–0.1)
Basophils Relative: 1 %
EOS PCT: 3 %
Eosinophils Absolute: 0.2 10*3/uL (ref 0.0–0.7)
HEMATOCRIT: 37.1 % — AB (ref 39.0–52.0)
Hemoglobin: 12.1 g/dL — ABNORMAL LOW (ref 13.0–17.0)
LYMPHS ABS: 1 10*3/uL (ref 0.7–4.0)
LYMPHS PCT: 18 %
MCH: 30 pg (ref 26.0–34.0)
MCHC: 32.6 g/dL (ref 30.0–36.0)
MCV: 91.8 fL (ref 78.0–100.0)
Monocytes Absolute: 0.6 10*3/uL (ref 0.1–1.0)
Monocytes Relative: 11 %
NEUTROS ABS: 3.6 10*3/uL (ref 1.7–7.7)
Neutrophils Relative %: 67 %
PLATELETS: 176 10*3/uL (ref 150–400)
RBC: 4.04 MIL/uL — AB (ref 4.22–5.81)
RDW: 14.7 % (ref 11.5–15.5)
WBC: 5.3 10*3/uL (ref 4.0–10.5)

## 2015-10-25 LAB — GLUCOSE, CAPILLARY
GLUCOSE-CAPILLARY: 112 mg/dL — AB (ref 65–99)
GLUCOSE-CAPILLARY: 156 mg/dL — AB (ref 65–99)
Glucose-Capillary: 107 mg/dL — ABNORMAL HIGH (ref 65–99)
Glucose-Capillary: 118 mg/dL — ABNORMAL HIGH (ref 65–99)

## 2015-10-25 NOTE — Procedures (Signed)
Pt placed on home cpap with home settings. 

## 2015-10-25 NOTE — NC FL2 (Signed)
[place] MEDICAID FL2 LEVEL OF CARE SCREENING TOOL     IDENTIFICATION  Patient Name: Roger [name] Birthdate: 08-23-1930 Sex: male Admission Date (Current Location): 10/07/2015  [facility] [place] and [place] Number:  Producer, television/film/video and Address:  [facility] [place], [address] [place], [place] [address]      Provider Number: [id number]  Attending Physician Name and Address:  Ankit Karis Juba, MD  Relative Name and Phone Number:  Corky Mull    Current Level of Care: Other (Comment) (rehab) Recommended Level of Care: Skilled Nursing Facility Prior Approval Number:    Date Approved/Denied:   PASRR Number: [id number]  Discharge Plan: SNF    Current Diagnoses: Patient Active Problem List   Diagnosis Date Noted  . Acute confusional state   . Dry mouth   . Insomnia   . Muscle spasticity   . AKI (acute kidney injury) (HCC)   . Benign essential HTN   . Dementia   . DM type 2 with diabetic peripheral neuropathy (HCC)   . Hemiplegia affecting non-dominant side, post-stroke (HCC)   . Dysphagia as late effect of cerebrovascular disease   . Basal ganglia infarction (HCC) 10/07/2015  . Acute left hemiparesis (HCC)   . Gait disturbance, post-stroke   . HLD (hyperlipidemia)   . Slurred speech 10/04/2015  . Malignant hypertension 10/04/2015  . Diabetes (HCC) 10/04/2015  . Stroke (HCC) 10/04/2015  . Left-sided weakness   . Stroke-like symptoms   . Physical deconditioning   . Diabetes mellitus with complication (HCC)   . Alzheimer's disease 08/14/2013    Orientation RESPIRATION BLADDER Height & Weight     Self, Place, Situation  Normal Incontinent Weight: 129 lb 6.6 oz (58.7 kg) Height:  5\' 9"  (175.3 cm)  BEHAVIORAL SYMPTOMS/MOOD NEUROLOGICAL BOWEL NUTRITION STATUS      Incontinent Diet (Dys 2 nectar thick liquids)  AMBULATORY STATUS COMMUNICATION OF NEEDS Skin   Extensive Assist Verbally Normal                       Personal Care  Assistance Level of Assistance  Bathing, Dressing Bathing Assistance: Limited assistance   Dressing Assistance: Limited assistance     Functional Limitations Info  Speech     Speech Info: Impaired    SPECIAL CARE FACTORS FREQUENCY  PT (By licensed PT), OT (By licensed OT), Speech therapy     PT Frequency: 5x week OT Frequency: 5 x week     Speech Therapy Frequency: 5 x week      Contractures Contractures Info: Not present    Additional Factors Info  Code Status Code Status Info: Full Code             Current Medications (10/25/2015):  This is the current hospital active medication list Current Facility-Administered Medications  Medication Dose Route Frequency Provider Last Rate Last Dose  . acetaminophen (TYLENOL) tablet 325-650 mg  325-650 mg Oral Q4H PRN Mcarthur Rossetti Angiulli, PA-C   650 mg at 10/08/15 0210  . acyclovir ointment (ZOVIRAX) 5 %   Topical Q3H Jones Bales, NP      . antiseptic oral rinse (CPC / CETYLPYRIDINIUM CHLORIDE 0.05%) solution 7 mL  7 mL Mouth Rinse q12n4p Ankit Karis Juba, MD   7 mL at 10/24/15 1547  . atorvastatin (LIPITOR) tablet 20 mg  20 mg Oral q1800 Mcarthur Rossetti Angiulli, PA-C   20 mg at 10/24/15 1812  . chlorhexidine (PERIDEX) 0.12 % solution 15 mL  15 mL Mouth Rinse BID Ankit Karis Juba, MD   15 mL at 10/25/15 0803  . clopidogrel (PLAVIX) tablet 75 mg  75 mg Oral Daily Mcarthur Rossetti Angiulli, PA-C   75 mg at 10/25/15 1610  . colchicine tablet 0.6 mg  0.6 mg Oral Daily Mcarthur Rossetti Angiulli, PA-C   0.6 mg at 10/25/15 9604  . docusate (COLACE) 50 MG/5ML liquid 200 mg  200 mg Oral QHS Ankit Karis Juba, MD   200 mg at 10/24/15 2111  . finasteride (PROSCAR) tablet 5 mg  5 mg Oral Daily Mcarthur Rossetti Angiulli, PA-C   5 mg at 10/25/15 5409  . food thickener (THICK IT) powder   Oral PRN Erick Colace, MD      . food thickener (THICK IT) powder   Oral PRN Ankit Karis Juba, MD      . glimepiride (AMARYL) tablet 4 mg  4 mg Oral Q breakfast Ranelle Oyster, MD    4 mg at 10/25/15 0803  . heparin injection 5,000 Units  5,000 Units Subcutaneous 3 times per day Charlton Amor, PA-C   5,000 Units at 10/25/15 0540  . insulin aspart (novoLOG) injection 0-9 Units  0-9 Units Subcutaneous TID WC Mcarthur Rossetti Angiulli, PA-C   1 Units at 10/24/15 1813  . lisinopril (PRINIVIL,ZESTRIL) tablet 20 mg  20 mg Oral BID Ankit Karis Juba, MD   20 mg at 10/25/15 0804  . metFORMIN (GLUCOPHAGE) tablet 250 mg  250 mg Oral BID WC Scarlett Presto, RPH   250 mg at 10/25/15 8119  . ondansetron (ZOFRAN) tablet 4 mg  4 mg Oral Q6H PRN Mcarthur Rossetti Angiulli, PA-C   4 mg at 10/08/15 0123   Or  . ondansetron (ZOFRAN) injection 4 mg  4 mg Intravenous Q6H PRN Mcarthur Rossetti Angiulli, PA-C      . polyethylene glycol (MIRALAX / GLYCOLAX) packet 17 g  17 g Oral Daily PRN Mcarthur Rossetti Angiulli, PA-C   17 g at 10/08/15 2105  . sorbitol 70 % solution 30 mL  30 mL Oral Daily PRN Mcarthur Rossetti Angiulli, PA-C   30 mL at 10/11/15 0640  . tiZANidine (ZANAFLEX) tablet 8 mg  8 mg Oral TID Ankit Karis Juba, MD   8 mg at 10/25/15 0804  . traZODone (DESYREL) tablet 25 mg  25 mg Oral QHS Ranelle Oyster, MD   25 mg at 10/24/15 2111     Discharge Medications: Please see discharge summary for a list of discharge medications.  Relevant Imaging Results:  Relevant Lab Results:   Additional Information    Henriette Hesser, [name], LCSW

## 2015-10-25 NOTE — Progress Notes (Signed)
Physical Therapy Weekly Progress Note  Patient Details  Name: Roger [name] MRN: [id number] Date of Birth: 07/04/1931  Beginning of progress report period: October 15, 2015 End of progress report period: October 25, 2015 (late entry for October 23, 2015)  Today's Date: 10/25/2015 PT Individual Time: 1000-1100 PT Individual Time Calculation (min): 60 min   Patient has met 1 of 4 short term goals.  Pt is making slow progress with bed mobility, transfers, and ambulation. Pt continues to fatigue quickly with activity and requires frequent cuing for posture & balance control in sitting and standing. Pt also appears to be limited by decreased cognition. Pt continues to require Mod A for transfers bed<>w/c and mod/max A for bed mobility, as well as max A for ambulation with hemiwalker.   Patient continues to demonstrate the following deficits: impaired sitting and standing balance, impaired standing posture with forward flexed trunk, decreased LUE & LLE strength, decreased coordination, L inattention and therefore will continue to benefit from skilled PT intervention to enhance overall performance with activity tolerance, balance, postural control, ability to compensate for deficits, functional use of  left upper extremity and left lower extremity, attention, awareness and coordination.  Patient progressing toward long term goals. Pt's goals modified based on patient's progress.  Continue plan of care.  PT Short Term Goals Week 2:  PT Short Term Goal 1 (Week 2): Pt will be able to transfer supine<>sit with Mod A with use of bed features. PT Short Term Goal 1 - Progress (Week 2): Progressing toward goal (Pt able to transfer supine>sit with mod A, but requires Max for sit>supine) PT Short Term Goal 2 (Week 2): Pt will be able to transfer sit<>stand with Min A. PT Short Term Goal 2 - Progress (Week 2): Progressing toward goal (pt's performance is inconsistent; pt able to complete occasional sit>stand with Min A  but requires Mod A for squat pivot transfers) PT Short Term Goal 3 (Week 2): Pt will be able to perform stand pivot tranfsfers with Mod A. PT Short Term Goal 3 - Progress (Week 2): Progressing toward goal (mod/max A; requires heavy cuing) PT Short Term Goal 4 (Week 2): Pt will be able to tolerate sitting at EOB with Min A and able to correct LOB & posture. - MET Week 3:  PT Short Term Goal 1 (Week 3): STG = LTG due to short stay & d/c date.  Skilled Therapeutic Interventions/Progress Updates:    Pt received in w/c & agreeable to PT, denying c/o pain. Pt ambulated 5 ft + 12 ft in hallway with hemi-walker and PT supporting L side of body with Max A. Pt with great difficulty achieving full upright posture even with tactile cuing and assistance. Pt with forward flexed trunk and weight shift to L, hindering ability to advance LLE. Pt provided with visual on his standing posture and on 2nd ambulation trial was able to correct posture slightly. Pt provided with tactile cuing for weight shifts & pt able to carryover and weight shift with min A afterwards; pt with difficulty activating LLE to advance extremity once he completed weight shifts. Pt fatigued quickly & required seated rest break. Pt transferred w/c>mat via squat pivot mod A and significant verbal cuing. PT educated pt to hook LLE with RLE for sit<>supine, but pt required max A sit>supine and Mod A  Supine>sit. While in supine pt performed bridging with BLE & adductor control and single leg bridging with LLE only but unable to clear buttocks from mat. Pt positioned  in prone for extensor stretch & pt tolerated position for 3 minutes. Pt transferred back to w/c in manner noted above, then propelled w/c with LUE & LLE for ~50 ft with MinA/Supervision, requiring cues to attend to L and avoid obstacles. At end of session pt left in w/c in room with daughter present to supervise.   Therapy Documentation Precautions:  Precautions Precautions:  Fall Restrictions Weight Bearing Restrictions: No Pain: Pain Assessment Pain Assessment: No/denies pain   See Function Navigator for Current Functional Status.  Therapy/Group: Individual Therapy  Waunita Schooner 10/25/2015, 12:41 PM

## 2015-10-25 NOTE — Plan of Care (Signed)
Problem: RH Ambulation Goal: LTG Patient will ambulate in controlled environment (PT) LTG: Patient will ambulate in a controlled environment, # of feet with assistance (PT).  With LRAD  Problem: RH Wheelchair Mobility Goal: LTG Patient will propel w/c in community environment (PT) LTG: Patient will propel wheelchair in community environment, # of feet with assist (PT)  Outcome: Not Applicable Date Met:  46/56/81 D/c secondary to slow progress

## 2015-10-25 NOTE — Progress Notes (Signed)
Speech Language Pathology Daily Session Note  Patient Details  Name: Roger [name] MRN: [id number] Date of Birth: [birth year]  Today's Date: 10/25/2015 SLP Individual Time: 1500-1530 SLP Individual Time Calculation (min): 30 min  Short Term Goals: Week 3: SLP Short Term Goal 1 (Week 3): Patient will consume current diet with minimal overt s/s of aspiration with Min A verbal cues for use of swallowing compensatory strategies.  SLP Short Term Goal 2 (Week 3): Patient will consume trials of thin liquids via tsp with minimal overt s/s of aspiration in less than 30% of trials to assess readniess for repeat MBS with Min A verbal cues for use of swallow strategies . SLP Short Term Goal 3 (Week 3): Patient will utilize an increased vocal intensity and over-articulation at the phrase and sentence level with Mod A verbal cues to maximize intelligibility to 75%.  SLP Short Term Goal 4 (Week 3): Patient will demonstrate sustained attention to functional tasks for 10 minutes with Min A verbal cues for redirection.   Skilled Therapeutic Interventions: Pt seen for dysphagia therapy. Oral care completed with setup assist. Pt trialed with thin water via teaspoon x 10. Pt demonstrated prompt cough with 1/10 trials. Intermittent throat clear. Encouraged strong throat clear. Vocal quality remained clear. Pt able to demonstrate speech with 100% intelligibility with mod verbal/visual cueing to increase vocal intensity to adequate level.    Function:  Eating Eating   Modified Consistency Diet: Yes Eating Assist Level: Supervision or verbal cues;Set up assist for   Eating Set Up Assist For: Opening containers       Cognition Comprehension Comprehension assist level: Understands basic 75 - 89% of the time/ requires cueing 10 - 24% of the time  Expression   Expression assist level: Expresses basic 75 - 89% of the time/requires cueing 10 - 24% of the time. Needs helper to occlude trach/needs to repeat words.   Social Interaction Social Interaction assist level: Interacts appropriately 90% of the time - Needs monitoring or encouragement for participation or interaction.  Problem Solving Problem solving assist level: Solves basic 25 - 49% of the time - needs direction more than half the time to initiate, plan or complete simple activities  Memory Memory assist level: Recognizes or recalls 25 - 49% of the time/requires cueing 50 - 75% of the time    Pain Pain Assessment Pain Assessment: No/denies pain  Therapy/Group: Individual Therapy  Rocky Crafts 10/25/2015, 3:38 PM

## 2015-10-25 NOTE — Progress Notes (Signed)
[facility] PHYSICAL MEDICINE & REHABILITATION     PROGRESS NOTE  Subjective/Complaints:  Patient seen this morning sitting in bed eating breakfast. His daughter is at bedside. Both state the patient sleeping better and had a good weekend overall.  ROS: Denies CP, SOB, n/v/d.   Objective: Vital Signs: Blood pressure 137/61, pulse 63, temperature 97.6 F (36.4 C), temperature source Oral, resp. rate 18, height 5\' 9"  (1.753 m), weight 58.7 kg (129 lb 6.6 oz), SpO2 98 %. No results found.  Recent Labs  10/25/15 0549  WBC 5.3  HGB 12.1*  HCT 37.1*  PLT 176    Recent Labs  10/25/15 0549  NA 144  K 4.7  CL 103  GLUCOSE 130*  BUN 22*  CREATININE 1.34*  CALCIUM 9.6   CBG (last 3)   Recent Labs  10/24/15 1658 10/24/15 2036 10/25/15 0641  GLUCAP 139* 155* 107*    Wt Readings from Last 3 Encounters:  10/20/15 58.7 kg (129 lb 6.6 oz)  10/07/15 66.2 kg (145 lb 15.1 oz)  02/20/14 63.957 kg (141 lb)    Physical Exam:  BP 137/61 mmHg  Pulse 63  Temp(Src) 97.6 F (36.4 C) (Oral)  Resp 18  Ht 5\' 9"  (1.753 m)  Wt 58.7 kg (129 lb 6.6 oz)  BMI 19.10 kg/m2  SpO2 98% HENT: Normocephalic, Atraumatic.  Eyes: EOM and Conj are normal.  Cardiovascular: Normal rate and regular rhythm. Grade 3 systolic ejection murmur Respiratory: Effort normal and breath sounds normal. No respiratory distress.  GI: Soft. Bowel sounds are normal. He exhibits no distension.  Neurological:  Patient is alert.   Dysarthric.   Left facial droop. Decreased oro-motor control  Motor: LUE: 1/5 elbow flexion/extension, 0/5 distally mAS  1/4 shoulder abduction, 2/4 elbow flexion, 1/4 wrist flexion, 1/4 ankle dorsiflexion RLE: 4/5 proximal to distal RUE: 5/5 proximal to distal LLE: hip flexion 3/5, Knee extension 2/5, ankle dorsi/plantar flexion 0/5 LLE clonus  Skin: Skin is warm and dry.   Assessment/Plan: 1. 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 Physiatrist is providing close team supervision and 24 hour management of active medical problems listed below. Physiatrist and rehab team continue to assess barriers to discharge/monitor patient progress toward functional and medical goals.  Function:  Bathing Bathing position   Position: Wheelchair/chair at sink  Bathing parts Body parts bathed by patient: Left arm, Chest, Abdomen, Left upper leg, Front perineal area Body parts bathed by helper: Right arm, Buttocks, Left upper leg, Left lower leg, Back  Bathing assist Assist Level: Touching or steadying assistance(Pt > 75%)   Set up : To obtain items, To adjust water temperature, To open containers  Upper Body Dressing/Undressing Upper body dressing   What is the patient wearing?: Pull over shirt/dress     Pull over shirt/dress - Perfomed by patient: Thread/unthread right sleeve, Put head through opening, Pull shirt over trunk Pull over shirt/dress - Perfomed by helper: Thread/unthread left sleeve        Upper body assist Assist Level: Touching or steadying assistance(Pt > 75%)      Lower Body Dressing/Undressing Lower body dressing   What is the patient wearing?: Pants, Ted Hose, Shoes Underwear - Performed by patient: Thread/unthread right underwear leg Underwear - Performed by helper: Thread/unthread right underwear leg, Thread/unthread left underwear leg, Pull underwear up/down Pants- Performed by patient: Thread/unthread right pants leg Pants- Performed by  helper: Thread/unthread right pants leg, Thread/unthread left pants leg, Pull pants up/down, Fasten/unfasten pants   Non-skid slipper socks- Performed by helper: Don/doff right sock, Don/doff left sock     Shoes - Performed by patient: Fasten right, Fasten left Shoes - Performed by helper: Don/doff right shoe, Don/doff left shoe       TED Hose -  Performed by helper: Don/doff right TED hose, Don/doff left TED hose  Lower body assist Assist for lower body dressing: Touching or steadying assistance (Pt > 75%)      Toileting Toileting Toileting activity did not occur: No continent bowel/bladder event Toileting steps completed by patient: Adjust clothing prior to toileting Toileting steps completed by helper: Adjust clothing prior to toileting, Performs perineal hygiene, Adjust clothing after toileting Toileting Assistive Devices: Grab bar or rail  Toileting assist Assist level: Touching or steadying assistance (Pt.75%)   Transfers Chair/bed transfer   Chair/bed transfer method: Stand pivot Chair/bed transfer assist level: Moderate assist (Pt 50 - 74%/lift or lower) Chair/bed transfer assistive device: Armrests     Locomotion Ambulation Ambulation activity did not occur: Safety/medical concerns   Max distance: 63ft Assist level: Maximal assist (Pt 25 - 49%)   Wheelchair Wheelchair activity did not occur: Safety/medical concerns Type: Manual Max wheelchair distance: 277ft Assist Level: Supervision or verbal cues  Cognition Comprehension Comprehension assist level: Understands basic 75 - 89% of the time/ requires cueing 10 - 24% of the time  Expression Expression assist level: Expresses basic 50 - 74% of the time/requires cueing 25 - 49% of the time. Needs to repeat parts of sentences.  Social Interaction Social Interaction assist level: Interacts appropriately 50 - 74% of the time - May be physically or verbally inappropriate.  Problem Solving Problem solving assist level: Solves basic 25 - 49% of the time - needs direction more than half the time to initiate, plan or complete simple activities  Memory Memory assist level: Recognizes or recalls 25 - 49% of the time/requires cueing 50 - 75% of the time     Medical Problem List and Plan: 1. Left side weakness, dysphagia/dysarthria secondary to right lenticular nucleus and  corona radiata infarcts on 2/27  Cont CIR  Fluoxetine DC'd due to interaction with Plavix 2. DVT Prophylaxis/Anticoagulation: Subcutaneous heparin. Monitor platelet count and any signs of bleeding 3. Pain Management: Tylenol as needed 4. Mood/dementia: Discuss cognitive baseline with family. Resumed Namenda 5. Neuropsych: This patient is not capable of making decisions on his own behalf. 6. Skin/Wound Care: Routine skin checks 7. Fluids/Electrolytes/Nutrition: Routine I&O's   Dysphagia 2, Nectar thick 8. Hypertension.   Lisinopril 20 twice a day to address overnight elevated values as well  Improved control 9. Diabetes mellitus of peripheral neuropathy. Hemoglobin A1c 8.1. Check blood sugars before meals and at bedtime.   Patient on Glucophage ex RR 500 mg twice daily prior to admission -   -sugar drop when higher readings are treated with ssi  Glucophage  bid started on 3/12.     Amaryl increase to  today  Fasting CBG  107 this AM  10. Hyperlipidemia. Lipitor 11. BPH. Resumed Proscar 5 mg daily 12. History of gout.Colchicine. Monitor for any gout flareup 13. AKI:   Cr.  1.34 on 3/20  Will cont to monitor and encourage PO intake.   14. Spasticity  Zanaflex increased to  TID on 3/13  Splints 15. Insomnia  Trazodone  scheduled  LOS (Days) 18 A FACE TO FACE EVALUATION WAS PERFORMED  Eldwin Volkov Karis Juba  10/25/2015 8:12 AM

## 2015-10-25 NOTE — Progress Notes (Signed)
Speech Language Pathology Daily Session Note  Patient Details  Name: Roger [name] MRN: [id number] Date of Birth: 30-May-1931  Today's Date: 10/25/2015 SLP Individual Time: 1300-1400 SLP Individual Time Calculation (min): 60 min  Short Term Goals: Week 3: SLP Short Term Goal 1 (Week 3): Patient will consume current diet with minimal overt s/s of aspiration with Min A verbal cues for use of swallowing compensatory strategies.  SLP Short Term Goal 2 (Week 3): Patient will consume trials of thin liquids via tsp with minimal overt s/s of aspiration in less than 30% of trials to assess readniess for repeat MBS with Min A verbal cues for use of swallow strategies . SLP Short Term Goal 3 (Week 3): Patient will utilize an increased vocal intensity and over-articulation at the phrase and sentence level with Mod A verbal cues to maximize intelligibility to 75%.  SLP Short Term Goal 4 (Week 3): Patient will demonstrate sustained attention to functional tasks for 10 minutes with Min A verbal cues for redirection.   Skilled Therapeutic Interventions:  Pt was seen for skilled ST targeting goals for dysphagia and cognition.  Pt consumed dys 2 textures and nectar thick liquids via teaspoon sized cup sips with min-mod assist verbal cues for use of swallowing precautions.  Wet vocal quality and weak, delayed cough were noted following larger boluses; however, smaller boluses were tolerated well without overt s/s of aspiration.  SLP facilitated the session with a novel card game targeting attention to task and basic problem solving.  Pt required max faded to min assist verbal and visual cues for recall of task rules and procedures.  Pt was able to sustain his attention to task for >5 minute intervals with min verbal cues for redirection.  Pt was returned to room and left in wheelchair with call bell within reach.  Continue per current plan of care.    Function:  Eating Eating   Modified Consistency Diet: Yes Eating  Assist Level: Supervision or verbal cues;Set up assist for   Eating Set Up Assist For: Opening containers       Cognition Comprehension Comprehension assist level: Understands basic 75 - 89% of the time/ requires cueing 10 - 24% of the time  Expression   Expression assist level: Expresses basic 75 - 89% of the time/requires cueing 10 - 24% of the time. Needs helper to occlude trach/needs to repeat words.  Social Interaction Social Interaction assist level: Interacts appropriately 50 - 74% of the time - May be physically or verbally inappropriate.  Problem Solving Problem solving assist level: Solves basic 25 - 49% of the time - needs direction more than half the time to initiate, plan or complete simple activities  Memory Memory assist level: Recognizes or recalls 25 - 49% of the time/requires cueing 50 - 75% of the time    Pain Pain Assessment Pain Assessment: No/denies pain  Therapy/Group: Individual Therapy  Homer Miller, Melanee Spry 10/25/2015, 3:26 PM

## 2015-10-25 NOTE — Progress Notes (Signed)
Occupational Therapy Session Note  Patient Details  Name: Roger [name] MRN: [id number] Date of Birth: September 06, 1930  Today's Date: 10/25/2015 OT Individual Time: 7829-5621 OT Individual Time Calculation (min): 45 min   Short Term Goals: Week 3:  OT Short Term Goal 1 (Week 3): STG=LTG d/t short remaining LOS prior to transfer to SNF for extended rehab  Skilled Therapeutic Interventions/Progress Updates: Therapeutic activity with focus on improved awareness, static and dynamic sitting balance, transfers, and weight-shifting from edge of mat to right side-lying.   Pt received seated in w/c with daughter present.   Pt presented as more lethargic than usual during prep for activity and he required moderate multimodal cues to attend to discussion relating to observed deficits and priorities with treatment.   Pt able to appreciate discussion and then participated in NMR using raised mat in ortho gym to focus on sitting balance ,weight-shifting, simulated bed mobility and transfers to/from w/c with daughter observing.   With use of mirror for visual feedback, patient was able to self-correct to maintain static sitting balance for up to 1 min with min assist.   Pt completed transfers with overall moderate assist, sit<>stand with max assist and manual facilitation to weight-bear through left lower extremity.     Pt returned to w/c and his room at end of session with daughter assisting.     Therapy Documentation Precautions:  Precautions Precautions: Fall Restrictions Weight Bearing Restrictions: No  Pain: No/denies pain   ADL: ADL ADL Comments: see Functional Assessment Tool  See Function Navigator for Current Functional Status.   Therapy/Group: Individual Therapy  [name] 10/25/2015, 12:23 PM

## 2015-10-25 NOTE — Progress Notes (Signed)
Social Work Patient ID: Roger [name], male   DOB: [birth year], 82 y.o.   MRN: [id number] Met with pt and daughter who have discussed the plans with wife and family members and feel thet option is to go to a NH for more theraps then hopefully home. Pt requires too much care for wife and daughter and each have back issues and are not able to provide physical care. Prefer to look at [facility] since they have a family member who  Works there. Will begin FL2 and fax over to [facility].

## 2015-10-26 ENCOUNTER — Inpatient Hospital Stay (HOSPITAL_COMMUNITY): Payer: Medicare Other | Admitting: Speech Pathology

## 2015-10-26 ENCOUNTER — Inpatient Hospital Stay (HOSPITAL_COMMUNITY): Payer: Medicare Other | Admitting: Physical Therapy

## 2015-10-26 ENCOUNTER — Inpatient Hospital Stay (HOSPITAL_COMMUNITY): Payer: Medicare Other

## 2015-10-26 LAB — GLUCOSE, CAPILLARY
GLUCOSE-CAPILLARY: 149 mg/dL — AB (ref 65–99)
GLUCOSE-CAPILLARY: 134 mg/dL — AB (ref 65–99)
Glucose-Capillary: 85 mg/dL (ref 65–99)
Glucose-Capillary: 86 mg/dL (ref 65–99)

## 2015-10-26 MED ORDER — TRAZODONE HCL 50 MG PO TABS: 25.0000 mg | ORAL_TABLET | Freq: Every evening | ORAL | Status: DC | PRN

## 2015-10-26 NOTE — Progress Notes (Signed)
Roger PHYSICAL MEDICINE & REHABILITATION     PROGRESS NOTE  Subjective/Complaints:  Pt seen sitting up in his chair getting cleaned.  He slept better last night and is more alert this AM per daughter.    ROS: Denies [name], Roger [name], n/v/d.   Objective: Vital Signs: Blood pressure 150/79, pulse 68, temperature 97.4 F (36.3 C), temperature source Oral, resp. rate 18, height  (1.753 m), weight 58.7 kg (129 lb 6.6 oz), SpO2 100 %. No results found.  Recent Labs  10/25/15 0549  WBC 5.3  HGB 12.1*  HCT 37.1*  PLT 176    Recent Labs  10/25/15 0549  NA 144  K 4.7  CL 103  GLUCOSE 130*  BUN 22*  CREATININE 1.34*  CALCIUM 9.6   CBG (last 3)   Recent Labs  10/25/15 1639 10/25/15 2045 10/26/15 0702  GLUCAP 112* 156* 85    Wt Readings from Last 3 Encounters:  10/20/15 58.7 kg (129 lb 6.6 oz)  10/07/15 66.2 kg (145 lb 15.1 oz)  02/20/14 63.957 kg (141 lb)    Physical Exam:  BP 150/79 mmHg  Pulse 68  Temp(Src) 97.4 F (36.3 C) (Oral)  Resp 18  Ht  (1.753 m)  Wt 58.7 kg (129 lb 6.6 oz)  BMI 19.10 kg/m2  SpO2 100% HENT: Normocephalic, Atraumatic.  Eyes: EOM and Conj are normal.  Cardiovascular: Normal rate and regular rhythm. Grade 3 systolic ejection murmur Respiratory: Effort normal and breath sounds normal. No respiratory distress.  GI: Soft. Bowel sounds are normal. He exhibits no distension.  Neurological:  Patient is alert.   Dysarthric.   Left facial droop. Decreased oro-motor control  Motor: LUE: 2-/5 elbow flexion/extension, 0/5 distally mAS  3/4 shoulder abduction, 1+/4 elbow flexion, 1/4 wrist flexion, 1/4 ankle dorsiflexion RLE: 4/5 proximal to distal RUE: 5/5 proximal to distal LLE: hip flexion 3/5, Knee extension 2/5, ankle dorsi/plantar flexion 0/5 LLE clonus  Skin: Skin is warm and dry.   Assessment/Plan: 1. 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 Physiatrist is providing close team supervision and 24 hour management of active medical problems listed below. Physiatrist and rehab team continue to assess barriers to discharge/monitor patient progress toward functional and medical goals.  Function:  Bathing Bathing position   Position: Wheelchair/chair at sink  Bathing parts Body parts bathed by patient: Left arm, Chest, Abdomen, Left upper leg, Front perineal area Body parts bathed by helper: Right arm, Buttocks, Left upper leg, Left lower leg, Back  Bathing assist Assist Level: Touching or steadying assistance(Pt > 75%)   Set up : To obtain items, To adjust water temperature, To open containers  Upper Body Dressing/Undressing Upper body dressing   What is the patient wearing?: Pull over shirt/dress     Pull over shirt/dress - Perfomed by patient: Thread/unthread right sleeve, Put head through opening, Pull shirt over trunk Pull over shirt/dress - Perfomed by helper: Thread/unthread left sleeve        Upper body assist Assist Level: Touching or steadying assistance(Pt > 75%)      Lower Body Dressing/Undressing Lower body dressing   What is the patient wearing?: Pants, Ted Hose, Shoes Underwear - Performed by patient: Thread/unthread right underwear leg Underwear - Performed by helper: Thread/unthread right underwear leg, Thread/unthread left underwear leg, Pull underwear up/down Pants- Performed by patient: Thread/unthread right pants leg Pants- Performed by helper:  Thread/unthread right pants leg, Thread/unthread left pants leg, Pull pants up/down, Fasten/unfasten pants   Non-skid slipper socks- Performed by helper: Don/doff right sock, Don/doff left sock     Shoes - Performed by patient: Fasten right, Fasten left Shoes - Performed by helper: Don/doff right shoe, Don/doff left shoe       TED Hose - Performed by helper: Don/doff right TED hose,  Don/doff left TED hose  Lower body assist Assist for lower body dressing: Touching or steadying assistance (Pt > 75%)      Toileting Toileting Toileting activity did not occur: No continent bowel/bladder event Toileting steps completed by patient: Adjust clothing prior to toileting Toileting steps completed by helper: Adjust clothing prior to toileting, Performs perineal hygiene, Adjust clothing after toileting Toileting Assistive Devices: Grab bar or rail  Toileting assist Assist level: Touching or steadying assistance (Pt.75%)   Transfers Chair/bed transfer   Chair/bed transfer method: Squat pivot Chair/bed transfer assist level: Moderate assist (Pt 50 - 74%/lift or lower) Chair/bed transfer assistive device: Armrests     Locomotion Ambulation Ambulation activity did not occur: Safety/medical concerns   Max distance: 12 Assist level: Maximal assist (Pt 25 - 49%)   Wheelchair Wheelchair activity did not occur: Safety/medical concerns Type: Manual Max wheelchair distance: 50 Assist Level: Touching or steadying assistance (Pt > 75%)  Cognition Comprehension Comprehension assist level: Understands basic 75 - 89% of the time/ requires cueing 10 - 24% of the time  Expression Expression assist level: Expresses basic 75 - 89% of the time/requires cueing 10 - 24% of the time. Needs helper to occlude trach/needs to repeat words.  Social Interaction Social Interaction assist level: Interacts appropriately 90% of the time - Needs monitoring or encouragement for participation or interaction.  Problem Solving Problem solving assist level: Solves basic 25 - 49% of the time - needs direction more than half the time to initiate, plan or complete simple activities  Memory Memory assist level: Recognizes or recalls 25 - 49% of the time/requires cueing 50 - 75% of the time     Medical Problem List and Plan: 1. Left side weakness, dysphagia/dysarthria secondary to right lenticular nucleus and  corona radiata infarcts on 2/27  Cont CIR  Fluoxetine DC'd due to interaction with Plavix 2. DVT Prophylaxis/Anticoagulation: Subcutaneous heparin. Monitor platelet count and any signs of bleeding 3. Pain Management: Tylenol as needed 4. Mood/dementia: Discuss cognitive baseline with family. Resumed Namenda 5. Neuropsych: This patient is not capable of making decisions on his own behalf. 6. Skin/Wound Care: Routine skin checks 7. Fluids/Electrolytes/Nutrition: Routine I&O's   Dysphagia 2, Nectar thick 8. Hypertension.   Lisinopril 20 twice a day to address overnight elevated values as well  Improved control 9. Diabetes mellitus of peripheral neuropathy. Hemoglobin A1c 8.1. Check blood sugars before meals and at bedtime.   Patient on Glucophage ex RR 500 mg twice daily prior to admission -   -sugar drop when higher readings are treated with ssi  Glucophage 250mg  bid started on 3/12.     Amaryl increase to 4mg  today  Fasting CBG 85 this AM  10. Hyperlipidemia. Lipitor 11. BPH. Resumed Proscar 5 mg daily 12. History of gout.Colchicine. Monitor for any gout flareup 13. AKI:   Cr.  1.34 on 3/20  Will cont to monitor and encourage PO intake.   14. Spasticity  Zanaflex increased to 8mg  TID on 3/13  Splints 15. Insomnia  Trazodone 25mg  PRN  LOS (Days) 19 A FACE TO FACE EVALUATION WAS PERFORMED  Edwing Figley Karis Juba 10/26/2015 8:31 AM

## 2015-10-26 NOTE — Progress Notes (Signed)
Social Work Patient ID: Roger [name], male   DOB: [birth year], 82 y.o.   MRN: [id number] Spoke with [facility] who reports they want to make a bed offer but bed will not be available until Friday, unless something comes up. Met with pt and daughter to inform them of this and they are prepared if bed available could be sooner, will contact health tomorrow am to find out if could take Pt sooner.

## 2015-10-26 NOTE — Progress Notes (Signed)
Occupational Therapy Session Note  Patient Details  Name: Roger [name] MRN: [id number] Date of Birth: [birth year]  Today's Date: 10/26/2015 OT Individual Time: 1000-1100 OT Individual Time Calculation (min): 60 min    Short Term Goals: Week 3:  OT Short Term Goal 1 (Week 3): STG=LTG d/t short remaining LOS prior to transfer to SNF for extended rehab  Skilled Therapeutic Interventions/Progress Updates: Therapeutic activities with focus on family education, LUE PROM and AROM, and functional mobility using hemi-w/c.   Pt received seated in w/c, RN dispensing meds and daughter present.  OT educated pt on daughter on benefit of treatment to provoke increased awareness (emergent and anticipatory) while challenging pt to direct assist with transfer to [facility] and provide self-appraisal of his deficits.   Pt was able to accurately define his weakness as requiring assist with transfers to weaker side (left) with need for physical assist to rise to perform stand-pivot transfer rather than attempt squat pivot.   Both pt and daughter report no benefit from use of slide board any longer d/t complexity of transfer.   Pt was then requested to self-propel w/c to [facility] placed in his room but he was unable to manage LLE sufficiently to enable w/c mobility without assist to place leg on leg rest.   OT provided initial education on use of DME (hemi-w/c) and provided w/c for initial instruction on placement of hand on dual rings to advance and navigate using hemi-w/c in hallways.   Pt was able to propel w/c approx 75' with min assist to correct migration toward left side d/t thenar eminence pressing on inner ring while he attempted to transfer hand to just outter ring.   Pt left with daughter continuing w/c training.          Therapy Documentation Precautions:  Precautions Precautions: Fall Restrictions Weight Bearing Restrictions: No  Vital Signs: Therapy Vitals BP: (!) 146/78 mmHg   Pain: Pain Assessment Pain  Assessment: No/denies pain Pain Score: 0-No pain Patients Stated Pain Goal: 2  ADL: ADL ADL Comments: see Functional Assessment Tool   See Function Navigator for Current Functional Status.   Therapy/Group: Individual Therapy  Jola Critzer 10/26/2015, 12:36 PM

## 2015-10-26 NOTE — Discharge Summary (Signed)
NAME:  Roger [name], Roger [name] NO.:  [id number]  MEDICAL RECORD NO.:  [id number]  LOCATION:  4M01C                        FACILITY:  [facility]  PHYSICIAN:  Maryla Morrow, MD        DATE OF BIRTH:  1931-05-10  DATE OF ADMISSION:  10/07/2015 DATE OF DISCHARGE:  10/27/2015                              DISCHARGE SUMMARY   DISCHARGE DIAGNOSES: 1. 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 2. Subcutaneous heparin for deep venous thrombosis prophylaxis. 3. Hypertension. 4. Diabetes mellitus, peripheral neuropathy. 5. Hyperlipidemia. 6. Benign prostatic hypertrophy. 7. History of gout. 8. Acute renal insufficiency.  Creatinine 1.34. 9. Muscle spasticity.  HISTORY OF PRESENT ILLNESS:  This is an 82 year old right-handed male, history of hypertension, diabetes mellitus, and dementia.  He lives with spouse independent with assistive device prior to admission.  One level home 2 steps to entry.  He was driving short distances prior to admission.  Presented on October 04, 2015, with left-sided weakness. MRI of the brain showed moderate-sized acute nonhemorrhagic infarct extending from the posterior right lenticular nucleus extending into the posterior right corona radiata.  Remote small cerebellar infarcts bilaterally, right paracentral pontine infarct.  CTA of head with no acute arterial findings.  Echocardiogram with ejection fraction 50% grade 1 diastolic dysfunction.  No wall motion abnormalities.  The patient did not receive tPA.  Neurology consulted, placed on Plavix for CVA prophylaxis.  Subcutaneous heparin for DVT prophylaxis.  Dysphagia #1 nectar thick liquid diet.  Physical and occupational therapy ongoing. The patient was admitted for comprehensive rehab program.  PAST MEDICAL HISTORY:  See discharge diagnoses.  SOCIAL HISTORY:  Lives with spouse, independent with a single-point cane prior to admission.   Functional status upon admission to rehab services was +2 physical assist, supine to sit; max assist, sit to stand; mod max assist, activities of daily living.  PHYSICAL EXAMINATION:  VITAL SIGNS:  Blood pressure 136/66, pulse 80, temperature 98, respirations 20. GENERAL:  This was an alert male, pleasantly confused, moderate dysarthria, poor historian, follows simple verbal commands. LUNGS:  Clear to auscultation without wheeze. CARDIAC:  Regular rate and rhythm.  No murmur. ABDOMEN:  Soft, nontender.  Good bowel sounds.  REHABILITATION HOSPITAL COURSE:  The patient was admitted to Inpatient Rehab Services with therapies initiated on a 3-hour daily basis, consisting of physical therapy, occupational therapy, speech therapy, and rehabilitation nursing.  The following issues were addressed during patient's rehabilitation stay.  Pertaining to Mr. Todt right lenticular nucleus and corona radiata infarct remained stable, maintained on Plavix therapy.  He would follow up with Neurology Services.  Subcutaneous heparin for DVT prophylaxis.  No bleeding episodes, discontinued at time of discharge.  He was on a dysphagia #2 nectar thick liquid diet followed by Speech Therapy.  Monitor for aspiration.  Blood pressures controlled.  No orthostatic changes on lisinopril.  Diabetes mellitus, peripheral neuropathy.  Hemoglobin A1c of 8.1.  He continued with Glucophage 250 mg twice daily as well as Amaryl increased to 4 mg daily.  Benign prostatic hypertrophy.  He was voiding without difficulty.  He remained on Proscar.  History of gout.  No gout flare-ups noted.  Hyperlipidemia with Lipitor ongoing.  Acute renal insufficiency, baseline creatinine 1.34 with recommendations followup chemistries as advised.  The patient received weekly collaborative interdisciplinary team conferences to discuss estimated length of stay, family teaching, any barriers to his discharge.  The patient with slow progress  with bed mobility transfers, ambulation, continued to fatigue quickly with activity, required frequent cuing for posture and balance control, appeared to be limited by decreased cognition.  Continued to demonstrate impaired sitting and standing balance.  Ambulated 5 feet and 12 feet in the hallway with a hemi- walker.  Required max assist, sit to supine; and moderate assist, supine to sit.  Activities of daily living and homemaking.  Educated family on safety awareness.  The patient was able to accurately define his weakness as requiring assist with transfers to weaker side.  Speech Therapy follow up as noted for dysphagia as well as dysarthria.  He was able to communicate his basic needs.  The patient with slow progressive gains.  Family did not feel they could provide the necessary assistance at home.  Recommendations of skilled nursing facility bed becoming available on October 27, 2015.  DISCHARGE MEDICATIONS: 1. Lipitor 20 mg p.o. daily. 2. Plavix 75 mg p.o. daily. 3. Colchicine 0.6 mg p.o. daily. 4. Colace 200 mg p.o. daily. 5. Proscar 5 mg p.o. daily. 6. Amaryl 4 mg p.o. daily. 7. Lisinopril 20 mg p.o. b.i.d. 8. Glucophage 250 mg p.o. b.i.d. 9. Zanaflex 8 mg p.o. t.i.d.  DIET:  His diet was dysphagia #2 nectar thick liquids.  FOLLOWUP:  He would follow up with [facility] as [facility]; Pearla Dubonnet, MD, medical management.     Mariam Dollar, P.A.   ______________________________ Maryla Morrow, MD    DA/MEDQ  D:  10/26/2015  T:  10/26/2015  Job:  [id number]  cc:   Pearla Dubonnet, M.D. Dr. Arbutus Leas

## 2015-10-26 NOTE — Progress Notes (Signed)
Speech Language Pathology Daily Session Note  Patient Details  Name: Roger [name] MRN: [id number] Date of Birth: 20-Jun-1931  Today's Date: 10/26/2015 SLP Individual Time: 2355-7322 SLP Individual Time Calculation (min): 25 min  Short Term Goals: Week 3: SLP Short Term Goal 1 (Week 3): Patient will consume current diet with minimal overt s/s of aspiration with Min A verbal cues for use of swallowing compensatory strategies.  SLP Short Term Goal 2 (Week 3): Patient will consume trials of thin liquids via tsp with minimal overt s/s of aspiration in less than 30% of trials to assess readniess for repeat MBS with Min A verbal cues for use of swallow strategies . SLP Short Term Goal 3 (Week 3): Patient will utilize an increased vocal intensity and over-articulation at the phrase and sentence level with Mod A verbal cues to maximize intelligibility to 75%.  SLP Short Term Goal 4 (Week 3): Patient will demonstrate sustained attention to functional tasks for 10 minutes with Min A verbal cues for redirection.   Skilled Therapeutic Interventions: Pt was seen for skilled ST targeting communication goals.  Pt required mod-max assist verbal cues for increased vocal intensity to achieve intelligibility during production of familiar phrases.  Pt more lethargic this afternoon and was slow to respond to SLP cues for use of intelligibility strategies.  Pt was left in bed with bed alarm set and family at bedside.  Continue per current plan of care.    Function:  Eating Eating              Cognition Comprehension Comprehension assist level: Understands basic 75 - 89% of the time/ requires cueing 10 - 24% of the time  Expression   Expression assist level: Expresses basic 75 - 89% of the time/requires cueing 10 - 24% of the time. Needs helper to occlude trach/needs to repeat words.  Social Interaction Social Interaction assist level: Interacts appropriately 90% of the time - Needs monitoring or  encouragement for participation or interaction.  Problem Solving Problem solving assist level: Solves basic 25 - 49% of the time - needs direction more than half the time to initiate, plan or complete simple activities  Memory Memory assist level: Recognizes or recalls 25 - 49% of the time/requires cueing 50 - 75% of the time    Pain Pain Assessment Pain Assessment: No/denies pain  Therapy/Group: Individual Therapy  Demitra Danley, Melanee Spry 10/26/2015, 4:07 PM

## 2015-10-26 NOTE — Plan of Care (Signed)
Problem: RH COGNITION-NURSING Goal: RH STG USES MEMORY AIDS/STRATEGIES W/ASSIST TO PROBLEM SOLVE STG Uses Memory Aids/Strategies With min Assistance to Problem Solve.  Outcome: Not Progressing Requires mod to max assist to problem solve more complex problems.

## 2015-10-26 NOTE — Progress Notes (Addendum)
Physical Therapy Session Note  Patient Details  Name: Roger [name] MRN: [id number] Date of Birth: 03-07-1931  Today's Date: 10/26/2015 PT Individual Time: 1116-1200 PT Individual Time Calculation (min): 44 min   Short Term Goals: Week 3:  PT Short Term Goal 1 (Week 3): STG = LTG due to short stay & d/c date.  Skilled Therapeutic Interventions/Progress Updates:    Pt received in w/c & agreeable to PT. OT provided pt with one arm drive hemi-w/c prior to PT session. PT instructed on hand placement on single and both wheels for steering w/c; pt required maximum verbal cuing for use of wheels on w/c. Pt able to propel w/c ~150 ft with supervision ranging to Mod A for negotiating turns. Pt with difficulty problem solving w/c positioning and attending to L side during activity. Pt negotiated 4 steps with R rail and total A. Pt required maximum assistance and verbal cuing for upright posture instead of forward trunk flexion, as all as cuing to activate LLE quads and assistance to progress LLE to negotiate steps. Very little LLE quad activation noted during activity and pt unable to weight shift to allow improved foot clearance & ease of activity. At end of session pt returned to room & left in w/c with daughter present to supervise.  Therapy Documentation Precautions:  Precautions Precautions: Fall Restrictions Weight Bearing Restrictions: No  Pain: Pain Assessment Pain Assessment: No/denies pain   See Function Navigator for Current Functional Status.   Therapy/Group: Individual Therapy  Sandi Mariscal 10/26/2015, 12:49 PM

## 2015-10-26 NOTE — Progress Notes (Signed)
Social Work Patient ID: Roger [name], male   DOB: October 17, 1930, 82 y.o.   MRN: [id number] Spoke with [facility] who will have a privated room tomorrow, pt has accepted and wife is in the room and also agrees. Have contacted Angela-daughter To make aware and she is agreeable also. Team aware plan for am therapies and transfer to [facility] after lunch. Work toward Estate manager/land agent.

## 2015-10-26 NOTE — Progress Notes (Signed)
Speech Language Pathology Daily Session Note  Patient Details  Name: Roger [name] MRN: [id number] Date of Birth: Jul 02, 1931  Today's Date: 10/26/2015 SLP Individual Time: 0800-0900 SLP Individual Time Calculation (min): 60 min  Short Term Goals: Week 3: SLP Short Term Goal 1 (Week 3): Patient will consume current diet with minimal overt s/s of aspiration with Min A verbal cues for use of swallowing compensatory strategies.  SLP Short Term Goal 2 (Week 3): Patient will consume trials of thin liquids via tsp with minimal overt s/s of aspiration in less than 30% of trials to assess readniess for repeat MBS with Min A verbal cues for use of swallow strategies . SLP Short Term Goal 3 (Week 3): Patient will utilize an increased vocal intensity and over-articulation at the phrase and sentence level with Mod A verbal cues to maximize intelligibility to 75%.  SLP Short Term Goal 4 (Week 3): Patient will demonstrate sustained attention to functional tasks for 10 minutes with Min A verbal cues for redirection.   Skilled Therapeutic Interventions: Pt seen for skilled dysphagia therapy with focus on utilization of compensatory strategies to increase safety with PO intake. Pt observed with Dys 2 tray with nectar thick liquid. Trialed nectar thick via cup sip. Required intermittent verbal and visual cueing to achieve small, single cup sips. 1 episode of strong cough when pt interrupted by a visitor while taking a drink. Pt also required intermittent cueing to use applesauce as an oral wash to clear generalized residue. Much improved self-monitoring of bite-size and rate of intake. Intelligibility at 90% acc at sentence level. Reinforced importance of quiet while eating and reduction of distractions. Pt and daughter verbalized understanding.    Function:  Eating Eating   Modified Consistency Diet: Yes Eating Assist Level: Supervision or verbal cues;Set up assist for;More than reasonable amount of time    Eating Set Up Assist For: Opening containers       Cognition Comprehension Comprehension assist level: Understands basic 75 - 89% of the time/ requires cueing 10 - 24% of the time  Expression   Expression assist level: Expresses basic 75 - 89% of the time/requires cueing 10 - 24% of the time. Needs helper to occlude trach/needs to repeat words.  Social Interaction Social Interaction assist level: Interacts appropriately 90% of the time - Needs monitoring or encouragement for participation or interaction.  Problem Solving Problem solving assist level: Solves basic 25 - 49% of the time - needs direction more than half the time to initiate, plan or complete simple activities  Memory Memory assist level: Recognizes or recalls 25 - 49% of the time/requires cueing 50 - 75% of the time    Pain Pain Assessment Pain Assessment: No/denies pain Pain Score: 0-No pain Patients Stated Pain Goal: 2  Therapy/Group: Individual Therapy  Rocky Crafts 10/26/2015, 1:19 PM

## 2015-10-26 NOTE — Discharge Summary (Signed)
Discharge summary job # [id number]

## 2015-10-26 NOTE — Progress Notes (Signed)
Placed patient on home CPAP without complications. RT will continue to monitor as needed.

## 2015-10-27 ENCOUNTER — Inpatient Hospital Stay (HOSPITAL_COMMUNITY): Payer: Medicare Other | Admitting: Physical Therapy

## 2015-10-27 ENCOUNTER — Inpatient Hospital Stay (HOSPITAL_COMMUNITY): Payer: Medicare Other | Admitting: Occupational Therapy

## 2015-10-27 ENCOUNTER — Ambulatory Visit (HOSPITAL_COMMUNITY): Payer: Medicare Other | Admitting: Speech Pathology

## 2015-10-27 LAB — GLUCOSE, CAPILLARY
GLUCOSE-CAPILLARY: 82 mg/dL (ref 65–99)
GLUCOSE-CAPILLARY: 134 mg/dL — AB (ref 65–99)

## 2015-10-27 MED ORDER — RESOURCE THICKENUP CLEAR PO POWD
ORAL | Status: DC | PRN
  Filled 2015-10-27: qty 125

## 2015-10-27 NOTE — Plan of Care (Signed)
Problem: RH Balance Goal: LTG Patient will maintain dynamic sitting balance (PT) LTG: Patient will maintain dynamic sitting balance with assistance during mobility activities (PT)  Outcome: Not Met (add Reason) 2/2 poor trunk control  Problem: RH Bed Mobility Goal: LTG Patient will perform bed mobility with assist (PT) LTG: Patient will perform bed mobility with assistance, with/without cues (PT).  Outcome: Not Met (add Reason) With hospital bed features  -- 2/2 reduced strength  Problem: RH Bed to Chair Transfers Goal: LTG Patient will perform bed/chair transfers w/assist (PT) LTG: Patient will perform bed/chair transfers with assistance, with/without cues (PT).  Outcome: Not Met (add Reason) Decreased sequencing & strength  Problem: RH Ambulation Goal: LTG Patient will ambulate in controlled environment (PT) LTG: Patient will ambulate in a controlled environment, # of feet with assistance (PT).  Outcome: Completed/Met Date Met:  10/27/15 25 ft with hemiwalker  Problem: RH Wheelchair Mobility Goal: LTG Patient will propel w/c in controlled environment (PT) LTG: Patient will propel wheelchair in controlled environment, # of feet with assist (PT)  Outcome: Not Met (add Reason) 2/2 L inattention Goal: LTG Patient will propel w/c in home environment (PT) LTG: Patient will propel wheelchair in home environment, # of feet with assistance (PT).  Outcome: Not Applicable Date Met:  81/85/63 2/2 L inattention

## 2015-10-27 NOTE — Progress Notes (Signed)
Physical Therapy Discharge Summary  Patient Details  Name: Roger [name] MRN: [id number] Date of Birth: [birth year]  Today's Date: 10/27/2015 PT Individual Time: 1100-1200 PT Individual Time Calculation (min): 60 min    Patient has met 1 of 6 long term goals due to improved activity tolerance, improved balance, improved postural control and increased strength.  Patient to discharge at a wheelchair level [place].   Patient's care partner is independent to provide the necessary physical assistance at discharge.  Reasons goals not met: Pt continues to demonstrate poor trunk control, decreased strength overall, poor sitting balance, L inattention, and decreased safety awareness. Pt appears to be limited by decreased cognition.  Recommendation:  Patient will benefit from ongoing skilled PT services in skilled nursing facility setting to continue to advance safe functional mobility, address ongoing impairments in LLE strength, activity tolerance, L inattention, sitting & standing balance, w/c mobility, and ambulation, and minimize fall risk.  Equipment: No equipment provided. Will defer to SNF setting.  Reasons for discharge: lack of progress toward goals  Patient/family agrees with progress made and goals achieved: Yes  Skilled PT treatment: Pt received in w/c, with daughter present, & agreeable to PT. PT positioned w/c for transfer & pt completed squat pivot w/c>bed with mod A, requiring maximum cuing to increase anterior weight shift to allow increased ease of task. Once sitting on EOB pt required cuing to hook LLE with RLE & transfer LE's onto bed; pt required mod A for sit>supine. Pt able to roll L in bed with supervision & roll R with Min A utilizing bed rails. Pt required Mod A for supine>sit, as PT cued pt to push into bed with RUE to bring trunk to upright position but pt unable 2/2 reduced arm & core strength. Pt transferred bed>w/c in same manner as noted above. PT instructed pt &  daughter on car transfer & pt able to complete task with Mod A for squat pivot w/c<>car and also requiring Mod A to transfer LE's in/out of car. Pt able to propel w/c 150 ft with RUE (1 arm drive w/c), but required assistance to negotiate turns/doorways & attend to L. Pt continues to look ahead as w/c moves towards L even with cuing from PT To turn head to left to help reduce inattention. Pt ambulated 25 ft with Max A from PT supporting L side of patients body and hemiwalker. Pt with cuing for sequencing and at times demonstrated incorrect sequence. Pt able to minimally activate quads and required assistance to advance & place LLE. Pt continues to demonstrate poor upright posture and poor weight shifting to R. At end of session pt left in w/c in room with daughter present to supervise.   PT Discharge Precautions/Restrictions Precautions Precautions: Fall Restrictions Weight Bearing Restrictions: No Pain Pain Assessment Pain Assessment: No/denies pain Vision/Perception    L inattention Cognition Overall Cognitive Status: Impaired/Different from baseline Arousal/Alertness: Awake/alert Orientation Level: Oriented to person;Oriented to place;Oriented to situation;Oriented to time Attention: Sustained Memory: Impaired Memory Impairment: Decreased recall of new information;Storage deficit;Retrieval deficit Awareness: Impaired Awareness Impairment: Anticipatory impairment Problem Solving: Impaired Problem Solving Impairment: Verbal basic;Functional basic Organizing: Impaired Safety/Judgment: Impaired Sensation Sensation Light Touch: Appears Intact (BLE) Light Touch Impaired Details: Impaired LLE Stereognosis: Not tested Proprioception: Appears Intact (BLE) Proprioception Impaired Details: Impaired LLE;Impaired LUE Coordination Gross Motor Movements are Fluid and Coordinated: No Fine Motor Movements are Fluid and Coordinated: No Motor  Motor Motor: Hemiplegia (L side) Motor - Skilled  Clinical Observations: (-) clonus BLE  Mobility Bed Mobility Bed Mobility: Rolling Left;Rolling Right;Supine to Sit;Sit to Supine Rolling Right: 4: Min assist Rolling Right Details: Verbal cues for sequencing;Verbal cues for technique Rolling Left: 5: Supervision Supine to Sit: 3: Mod assist (bed rails & HOB elevated) Supine to Sit Details: Tactile cues for sequencing;Verbal cues for technique;Verbal cues for sequencing Sit to Supine: 3: Mod assist (with bed features) Sit to Supine - Details: Tactile cues for sequencing;Verbal cues for technique Transfers Transfers: Yes Sit to Stand: 4: Min assist Sit to Stand Details: Tactile cues for sequencing;Verbal cues for sequencing;Verbal cues for technique Stand to Sit: 3: Mod assist;2: Max assist Stand to Sit Details (indicate cue type and reason): Manual facilitation for weight bearing;Manual facilitation for placement;Manual facilitation for weight shifting;Verbal cues for safe use of DME/AE Squat Pivot Transfers: 3: Mod assist Squat Pivot Transfer Details: Tactile cues for sequencing;Verbal cues for sequencing;Verbal cues for technique Locomotion  Ambulation Ambulation: Yes Ambulation/Gait Assistance: 2: Max assist Ambulation Distance (Feet): 25 Feet Assistive device: Hemi-walker Ambulation/Gait Assistance Details: Tactile cues for sequencing;Tactile cues for initiation;Tactile cues for posture;Tactile cues for weight shifting;Tactile cues for placement;Verbal cues for sequencing;Verbal cues for technique;Verbal cues for gait pattern Ambulation/Gait Assistance Details: with PT supporting L side of body Gait Gait: Yes Gait Pattern: Step-to pattern;Decreased step length - left;Decreased weight shift to right;Decreased dorsiflexion - left;Decreased hip/knee flexion - left Wheelchair Mobility Wheelchair Mobility: Yes Wheelchair Assistance: 4: Min Lexicographer: Right upper extremity (1 arm drive w/c) Wheelchair Parts  Management: Needs assistance Distance: 150 ft  Trunk/Postural Assessment  Cervical Assessment Cervical Assessment: Within Functional Limits Thoracic Assessment Thoracic Assessment: Within Functional Limits Lumbar Assessment Lumbar Assessment: Within Functional Limits Postural Control Postural Control: Deficits on evaluation  Balance Balance Balance Assessed: Yes Static Sitting Balance Static Sitting - Balance Support: Right upper extremity supported;Feet supported Static Sitting - Level of Assistance: 5: Stand by assistance Static Sitting - Comment/# of Minutes: 2 Dynamic Sitting Balance Dynamic Sitting - Balance Support: Feet supported Dynamic Sitting - Level of Assistance: 3: Mod assist Extremity Assessment  RUE Assessment RUE Assessment: Within Functional Limits LUE Assessment LUE Assessment: Exceptions to WFL LUE Tone LUE Tone: Flaccid RLE Assessment RLE Assessment: Within Functional Limits LLE Assessment LLE Assessment: Exceptions to [facility] LLE Strength Left Hip Flexion: 2-/5 Left Knee Flexion: 1/5 Left Knee Extension: 2/5 Left Ankle Dorsiflexion: 0/5   See Function Navigator for Current Functional Status.  Waunita Schooner 10/27/2015, 5:40 PM

## 2015-10-27 NOTE — Progress Notes (Addendum)
[place] PHYSICAL MEDICINE & REHABILITATION     PROGRESS NOTE  Subjective/Complaints:  Patient seen this morning sitting up in his chair shaving with daughter present. He is excited to be discharged today. He is in good spirits.  ROS: Denies CP, SOB, n/v/d.   Objective: Vital Signs: Blood pressure 133/62, pulse 71, temperature 98.3 F (36.8 C), temperature source Oral, resp. rate 18, height 5\' 9"  (1.753 m), weight 58.7 kg (129 lb 6.6 oz), SpO2 100 %. No results found.  Recent Labs  10/25/15 0549  WBC 5.3  HGB 12.1*  HCT 37.1*  PLT 176    Recent Labs  10/25/15 0549  NA 144  K 4.7  CL 103  GLUCOSE 130*  BUN 22*  CREATININE 1.34*  CALCIUM 9.6   CBG (last 3)   Recent Labs  10/26/15 1719 10/26/15 2030 10/27/15 0643  GLUCAP 86 134* 82    Wt Readings from Last 3 Encounters:  10/20/15 58.7 kg (129 lb 6.6 oz)  10/07/15 66.2 kg (145 lb 15.1 oz)  02/20/14 63.957 kg (141 lb)    Physical Exam:  BP 133/62 mmHg  Pulse 71  Temp(Src) 98.3 F (36.8 C) (Oral)  Resp 18  Ht 5\' 9"  (1.753 m)  Wt 58.7 kg (129 lb 6.6 oz)  BMI 19.10 kg/m2  SpO2 100% HENT: Normocephalic, Atraumatic.  Eyes: EOM and Conj are normal.  Cardiovascular: Normal rate and regular rhythm. Grade 3 systolic ejection murmur Respiratory: Effort normal and breath sounds normal. No respiratory distress.  GI: Soft. Bowel sounds are normal. He exhibits no distension.  Neurological:  Patient is alert.   Dysarthric.   Left facial droop. Decreased oro-motor control  Motor: LUE: 2-/5 elbow flexion/extension, 0/5 distally mAS  3/4 shoulder abduction, 3/4 elbow flexion, 1/4 wrist flexion, 1/4 ankle dorsiflexion RLE: 4/5 proximal to distal RUE: 5/5 proximal to distal LLE: hip flexion 3/5, Knee extension 2/5, ankle dorsi/plantar flexion 0/5 LLE clonus  Skin: Skin is warm and dry.   Assessment/Plan: 1. 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 Physiatrist is providing close team supervision and 24 hour management of active medical problems listed below. Physiatrist and rehab team continue to assess barriers to discharge/monitor patient progress toward functional and medical goals.  Function:  Bathing Bathing position   Position: Wheelchair/chair at sink  Bathing parts Body parts bathed by patient: Left arm, Chest, Abdomen, Left upper leg, Front perineal area Body parts bathed by helper: Right arm, Buttocks, Left upper leg, Left lower leg, Back  Bathing assist Assist Level: Touching or steadying assistance(Pt > 75%)   Set up : To obtain items, To adjust water temperature, To open containers  Upper Body Dressing/Undressing Upper body dressing   What is the patient wearing?: Pull over shirt/dress     Pull over shirt/dress - Perfomed by patient: Thread/unthread right sleeve, Put head through opening, Pull shirt over trunk Pull over shirt/dress - Perfomed by helper: Thread/unthread left sleeve        Upper body assist Assist Level: Touching or steadying assistance(Pt > 75%)      Lower Body Dressing/Undressing Lower body dressing   What is the patient wearing?: Pants, Ted Hose, Shoes Underwear - Performed by patient: Thread/unthread right underwear leg Underwear - Performed by helper: Thread/unthread right underwear leg, Thread/unthread left underwear leg, Pull underwear up/down Pants- Performed by patient: Thread/unthread right pants leg Pants- Performed by helper:  Thread/unthread right pants leg, Thread/unthread left pants leg, Pull pants up/down, Fasten/unfasten pants   Non-skid slipper socks- Performed by helper: Don/doff right sock, Don/doff left sock     Shoes - Performed by patient: Fasten right, Fasten left Shoes - Performed by helper: Don/doff right shoe, Don/doff left shoe       TED Hose - Performed by helper: Don/doff  right TED hose, Don/doff left TED hose  Lower body assist Assist for lower body dressing: Touching or steadying assistance (Pt > 75%)      Toileting Toileting Toileting activity did not occur: No continent bowel/bladder event Toileting steps completed by patient: Adjust clothing prior to toileting Toileting steps completed by helper: Adjust clothing prior to toileting, Performs perineal hygiene, Adjust clothing after toileting Toileting Assistive Devices: Grab bar or rail  Toileting assist Assist level: Touching or steadying assistance (Pt.75%)   Transfers Chair/bed transfer   Chair/bed transfer method: Squat pivot Chair/bed transfer assist level: Moderate assist (Pt 50 - 74%/lift or lower) Chair/bed transfer assistive device: Armrests     Locomotion Ambulation Ambulation activity did not occur: Safety/medical concerns   Max distance: 12 Assist level: Maximal assist (Pt 25 - 49%)   Wheelchair Wheelchair activity did not occur: Safety/medical concerns Type: Manual Max wheelchair distance: 150 Assist Level: Touching or steadying assistance (Pt > 75%)  Cognition Comprehension Comprehension assist level: Understands basic 75 - 89% of the time/ requires cueing 10 - 24% of the time  Expression Expression assist level: Expresses basic 75 - 89% of the time/requires cueing 10 - 24% of the time. Needs helper to occlude trach/needs to repeat words.  Social Interaction Social Interaction assist level: Interacts appropriately 90% of the time - Needs monitoring or encouragement for participation or interaction.  Problem Solving Problem solving assist level: Solves basic 25 - 49% of the time - needs direction more than half the time to initiate, plan or complete simple activities  Memory Memory assist level: Recognizes or recalls 25 - 49% of the time/requires cueing 50 - 75% of the time     Medical Problem List and Plan: 1. Left side weakness, dysphagia/dysarthria secondary to right  lenticular nucleus and corona radiata infarcts on 2/27  DC today  Fluoxetine DC'd due to interaction with Plavix  Will see pt for transitional care management in 1-2 weeks 2. DVT Prophylaxis/Anticoagulation: Subcutaneous heparin. Monitor platelet count and any signs of bleeding 3. Pain Management: Tylenol as needed 4. Mood/dementia: Discuss cognitive baseline with family. Resumed Namenda 5. Neuropsych: This patient is not capable of making decisions on his own behalf. 6. Skin/Wound Care: Routine skin checks 7. Fluids/Electrolytes/Nutrition: Routine I&O's   Dysphagia 2, Nectar thick 8. Hypertension.   Lisinopril 20 twice a day to address overnight elevated values as well  Improved overall, consider making further adjustments as outpatient. 9. Diabetes mellitus of peripheral neuropathy. Hemoglobin A1c 8.1. Check blood sugars before meals and at bedtime.   Patient on Glucophage ex RR 500 mg twice daily prior to admission -   -sugar drop when higher readings are treated with ssi  Glucophage  bid started on 3/12.     Amaryl increase to  today  Fasting CBG 82 this AM  10. Hyperlipidemia. Lipitor 11. BPH. Resumed Proscar 5 mg daily 12. History of gout.Colchicine. Monitor for any gout flareup 13. AKI:   Cr.  1.34 on 3/20  Will cont to monitor and encourage PO intake.   14. Spasticity  Zanaflex increased to  TID on 3/13  Splints 15.  Insomnia  Trazodone 25mg  PRN  Patient's daughter states that melatonin works better for patient's, may continue melatonin  LOS (Days) 20 A FACE TO FACE EVALUATION WAS PERFORMED  Ankit Karis Juba 10/27/2015 8:16 AM

## 2015-10-27 NOTE — Progress Notes (Signed)
Occupational Therapy Discharge Summary and OT Intervention  Patient Details  Name: Roger [name] MRN: [id number] Date of Birth: September 15, 1930  Today's Date: 10/27/2015 OT Individual Time: 0915-1000 OT Individual Time Calculation (min): 45 min    Patient has met 5 of 11 long term goals due to improved activity tolerance, improved balance, postural control, ability to compensate for deficits and functional use of  LEFT upper and LEFT lower extremity.  Patient to discharge at [facility] Max Assist level.  Patient discharged to SNF for short term rehab in order to continue to address functional deficits.   Reasons goals not met: Pt requires increased assistance secondary to safety and pt did not met expectations for therapy stay.  Recommendation:  Patient will benefit from ongoing skilled OT services in skilled nursing facility setting to continue to advance functional skills in the area of BADL.  Equipment: Defer to next venue of care  Reasons for discharge: discharge from hospital  Patient/family agrees with progress made and goals achieved: Yes   OT Intervention: Upon entering the room, pt seated in wheelchair and finishing breakfast with daughter providing supervision for meal. Pt engaged in bathing and dressing task from wheelchair at sink side. Pt needing min cues to initiate self care tasks. Pt able to wash portion on lower leg with assist from therapist to cross ankle to opposite knee. Pt standing at sink with max A with therapist blocking L knee and second helper assisting with B clothing management. OT provided min cues for sequencing and hemiplegic dressing techniques for UB dressing. Pt needing assistance to thread shirt onto L UE. Caregiver remained in room to assist to further with grooming tasks as needed. Call bell and all needed items within reach upon exiting the room.   OT Discharge Precautions/Restrictions  Precautions Precautions: Fall Restrictions Weight Bearing  Restrictions: No Pain Pain Assessment Pain Assessment: No/denies pain Vision/Perception  Vision- History Baseline Vision/History: Wears glasses Patient Visual Report: No change from baseline  Cognition Overall Cognitive Status: Impaired/Different from baseline Arousal/Alertness: Awake/alert Orientation Level: Oriented to person;Oriented to place;Oriented to situation;Oriented to time Attention: Sustained Memory: Impaired Memory Impairment: Decreased recall of new information;Storage deficit;Retrieval deficit Awareness: Impaired Awareness Impairment: Anticipatory impairment Problem Solving: Impaired Problem Solving Impairment: Verbal basic;Functional basic Organizing: Impaired Safety/Judgment: Impaired Sensation Sensation Light Touch: Impaired Detail Light Touch Impaired Details: Impaired LLE Stereognosis: Not tested Proprioception: Impaired by gross assessment;Impaired Detail Proprioception Impaired Details: Impaired LLE;Impaired LUE Coordination Gross Motor Movements are Fluid and Coordinated: No Fine Motor Movements are Fluid and Coordinated: No Mobility  Transfers Transfers: Sit to Stand;Stand to Sit Sit to Stand: 2: Max assist;3: Mod assist;With armrests Sit to Stand Details: Manual facilitation for weight bearing;Manual facilitation for placement;Manual facilitation for weight shifting;Verbal cues for technique Stand to Sit: 3: Mod assist;2: Max assist Stand to Sit Details (indicate cue type and reason): Manual facilitation for weight bearing;Manual facilitation for placement;Manual facilitation for weight shifting;Verbal cues for safe use of DME/AE  Trunk/Postural Assessment  Cervical Assessment Cervical Assessment: Within Functional Limits Thoracic Assessment Thoracic Assessment: Within Functional Limits Lumbar Assessment Lumbar Assessment: Within Functional Limits Postural Control Postural Control: Deficits on evaluation  Balance Balance Balance Assessed:  Yes Static Sitting Balance Static Sitting - Balance Support: Bilateral upper extremity supported Static Sitting - Level of Assistance: 4: Min assist Dynamic Sitting Balance Dynamic Sitting - Balance Support: Feet supported Dynamic Sitting - Level of Assistance: 3: Mod assist Extremity/Trunk Assessment RUE Assessment RUE Assessment: Within Functional Limits LUE Assessment LUE Assessment: Exceptions to [facility]  LUE Tone LUE Tone: Flaccid   See Function Navigator for Current Functional Status.  Phineas Semen 10/27/2015, 12:22 PM

## 2015-10-27 NOTE — Progress Notes (Signed)
Speech Language Pathology Discharge Summary  Patient Details  Name: Roger [name] MRN: [id number] Date of Birth: Aug 24, 1930  Today's Date: 10/27/2015 SLP Individual Time: 0800-0900 SLP Individual Time Calculation (min): 60 min   Skilled Therapeutic Interventions: Pt seen for skilled dysphagia therapy with focus on utilization of compensatory strategies to increase safety with PO intake. Pt observed with Dys 2 tray with nectar thick liquid. Trialed nectar thick via cup sip. Required intermittent verbal and visual cueing to achieve small, single cup sips. 1 episode of strong cough when pt interrupted by a visitor while taking a drink. Pt also required intermittent cueing to use applesauce as an oral wash to clear generalized residue. Much improved self-monitoring of bite-size and rate of intake. Intelligibility at 90% acc at sentence level. Reviewed all compensatory strategies for swallowing and communication; pt and daughter verbalized understanding.      Patient has met 6 of 6 long term goals.  Patient to discharge at [facility] level for swallowing and communication.   Clinical Impression/Discharge Summary:  Pt made excellent steady gains during his time at [facility]. Pt is tolerating a Dysphagia 2 diet with nectar thick liquids with min verbal cueing to observe the following swallow precautions: small single cup sips, small bites, no mixed consistencies, use applesauce-like texture to achieve oral clearance of persistent solid residue, encourage strong throat clear when needed. Pt has successfully improved vocal intensity to communicate adequately at the sentence level with occasional verbal cueing. Pt's daughter, [name] has participated with pt's therapy sessions and is proficient in assisting pt with PO intake.  Care Partner:  Caregiver Able to Provide Assistance: Yes  Type of Caregiver Assistance: Cognitive;Physical  Recommendation:  Skilled Nursing facility  Rationale for SLP Follow Up:  Maximize cognitive function and independence;Maximize swallowing safety;Maximize functional communication;Reduce caregiver burden     Reasons for discharge: Discharged from hospital   Patient/Family Agrees with Progress Made and Goals Achieved: Yes   Function:  Eating Eating   Modified Consistency Diet: Yes Eating Assist Level: Supervision or verbal cues;Set up assist for;More than reasonable amount of time   Eating Set Up Assist For: Opening containers       Cognition Comprehension Comprehension assist level: Follows basic conversation/direction with extra time/assistive device  Expression   Expression assist level: Expresses basic 90% of the time/requires cueing < 10% of the time.  Social Interaction Social Interaction assist level: Interacts appropriately 90% of the time - Needs monitoring or encouragement for participation or interaction.  Problem Solving Problem solving assist level: Solves basic 25 - 49% of the time - needs direction more than half the time to initiate, plan or complete simple activities  Memory Memory assist level: Recognizes or recalls 25 - 49% of the time/requires cueing 50 - 75% of the time   Vinetta Bergamo 10/27/2015, 4:32 PM

## 2015-10-27 NOTE — Plan of Care (Signed)
Problem: RH Balance Goal: LTG Patient will maintain dynamic standing with ADLs (OT) LTG: Patient will maintain dynamic standing balance with assist during activities of daily living (OT)  Outcome: Not Met (add Reason) Pt needs increased assistance depending on fatigue level  Problem: RH Dressing Goal: LTG Patient will perform lower body dressing w/assist (OT) LTG: Patient will perform lower body dressing with assist, with/without cues in positioning using equipment (OT)  Outcome: Not Met (add Reason) Second helper needs as therapist stood with pt and second helper assisted with clothing management.  Problem: RH Functional Use of Upper Extremity Goal: LTG Patient will use RT/LT upper extremity as a (OT) LTG: Patient will use right/left upper extremity as a stabilizer/gross assist/diminished/nondominant/dominant level with assist, with/without cues during functional activity (OT)  Outcome: Not Met (add Reason) Stabilizer with assistance  Problem: RH Toilet Transfers Goal: LTG Patient will perform toilet transfers w/assist (OT) LTG: Patient will perform toilet transfers with assist, with/without cues using equipment (OT)  Outcome: Not Met (add Reason) Max A toilet transfer  Problem: RH Tub/Shower Transfers Goal: LTG Patient will perform tub/shower transfers w/assist (OT) LTG: Patient will perform tub/shower transfers with assist, with/without cues using equipment (OT)  Outcome: Not Met (add Reason) Assistance needed for clothing management and hygiene

## 2015-10-27 NOTE — Progress Notes (Signed)
LBM: 10/23/15. PRN miralax given. Active bowel sounds and no constipation symptoms per patient.

## 2015-10-27 NOTE — Progress Notes (Signed)
Social Work  Discharge Note  The overall goal for the admission was met for:   Discharge location: [facility] [place]  Length of Stay: Yes-20 DAYS  Discharge activity level: Yes-MIN/MOD LEVEL  Home/community participation: Yes  Services provided included: MD, RD, PT, OT, SLP, RN, CM, TR, Pharmacy and SW  Financial Services: Private Insurance: [facility]  Follow-up services arranged: Other: SHORT TERM NHP  Comments (or additional information):FAMILY NEEDS PT TO BE AT [place].  Patient/Family verbalized understanding of follow-up arrangements: Yes  Individual responsible for coordination of the follow-up plan: CHRISTINE-WIFE & ANGELA-DAUGHTER  Confirmed correct DME delivered: Elease Hashimoto 10/27/2015    Elease Hashimoto

## 2015-10-27 NOTE — Progress Notes (Signed)
Social Work Patient ID: Roger [name], male   DOB: 19-Nov-1930, 82 y.o.   MRN: [id number] Spoke with [facility] who will contact daughter to set up paperwork for today's transfer. Facility is ready for him after lunch. Will go by non-ambulance after lunch.

## 2015-10-28 ENCOUNTER — Telehealth: Payer: Self-pay

## 2015-10-28 NOTE — Telephone Encounter (Signed)
FYI

## 2015-10-28 NOTE — Telephone Encounter (Signed)
Called patient to schedule a hospital follow up appointment. Spoke with pt's daughter. She states that he was put in [facility] and is seeing his PCP. She states that seeing his PCP is sufficient and would not like to follow up with Dr. Allena Katz.

## 2015-11-05 ENCOUNTER — Inpatient Hospital Stay: Payer: Medicare Other | Admitting: Physical Medicine & Rehabilitation

## 2015-11-11 ENCOUNTER — Ambulatory Visit: Payer: Medicare Other | Admitting: Nurse Practitioner

## 2015-11-12 ENCOUNTER — Encounter: Payer: Self-pay | Admitting: Nurse Practitioner

## 2015-11-16 ENCOUNTER — Other Ambulatory Visit (HOSPITAL_COMMUNITY): Payer: Self-pay | Admitting: Internal Medicine

## 2015-11-16 DIAGNOSIS — I69354 Hemiplegia and hemiparesis following cerebral infarction affecting left non-dominant side: Secondary | ICD-10-CM

## 2015-11-17 ENCOUNTER — Other Ambulatory Visit (HOSPITAL_COMMUNITY): Payer: Self-pay | Admitting: Internal Medicine

## 2015-11-17 DIAGNOSIS — I69354 Hemiplegia and hemiparesis following cerebral infarction affecting left non-dominant side: Secondary | ICD-10-CM

## 2015-11-18 ENCOUNTER — Ambulatory Visit (HOSPITAL_COMMUNITY)
Admission: RE | Admit: 2015-11-18 | Discharge: 2015-11-18 | Disposition: A | Discharge status: Home / Self Care | Payer: Medicare Other | Source: Ambulatory Visit | Attending: Internal Medicine | Admitting: Internal Medicine

## 2015-11-18 ENCOUNTER — Ambulatory Visit (HOSPITAL_COMMUNITY): Payer: Medicare Other

## 2015-11-18 DIAGNOSIS — I739 Peripheral vascular disease, unspecified: Secondary | ICD-10-CM | POA: Diagnosis not present

## 2015-11-18 DIAGNOSIS — Z8673 Personal history of transient ischemic attack (TIA), and cerebral infarction without residual deficits: Secondary | ICD-10-CM | POA: Diagnosis not present

## 2015-11-18 DIAGNOSIS — G35 Multiple sclerosis: Secondary | ICD-10-CM | POA: Insufficient documentation

## 2015-11-18 DIAGNOSIS — I69354 Hemiplegia and hemiparesis following cerebral infarction affecting left non-dominant side: Secondary | ICD-10-CM

## 2015-11-18 MED ORDER — GADOBENATE DIMEGLUMINE 529 MG/ML IV SOLN
13.0000 mL | Freq: Once | INTRAVENOUS | Status: AC | PRN
  Administered 2015-11-18: 6.5 mL via INTRAVENOUS

## 2015-11-24 ENCOUNTER — Encounter: Payer: Self-pay | Admitting: *Deleted

## 2015-11-25 ENCOUNTER — Ambulatory Visit (INDEPENDENT_AMBULATORY_CARE_PROVIDER_SITE_OTHER): Payer: Medicare Other | Admitting: Nurse Practitioner

## 2015-11-25 ENCOUNTER — Encounter: Payer: Self-pay | Admitting: Nurse Practitioner

## 2015-11-25 ENCOUNTER — Ambulatory Visit: Payer: Medicare Other | Admitting: Nurse Practitioner

## 2015-11-25 VITALS — BP 92/55 | HR 87

## 2015-11-25 DIAGNOSIS — I639 Cerebral infarction, unspecified: Secondary | ICD-10-CM | POA: Diagnosis not present

## 2015-11-25 DIAGNOSIS — E785 Hyperlipidemia, unspecified: Secondary | ICD-10-CM

## 2015-11-25 DIAGNOSIS — I69398 Other sequelae of cerebral infarction: Secondary | ICD-10-CM | POA: Diagnosis not present

## 2015-11-25 DIAGNOSIS — I63312 Cerebral infarction due to thrombosis of left middle cerebral artery: Secondary | ICD-10-CM

## 2015-11-25 DIAGNOSIS — R269 Unspecified abnormalities of gait and mobility: Secondary | ICD-10-CM | POA: Diagnosis not present

## 2015-11-25 DIAGNOSIS — I10 Essential (primary) hypertension: Secondary | ICD-10-CM

## 2015-11-25 DIAGNOSIS — I69359 Hemiplegia and hemiparesis following cerebral infarction affecting unspecified side: Secondary | ICD-10-CM

## 2015-11-25 DIAGNOSIS — IMO0002 Reserved for concepts with insufficient information to code with codable children: Secondary | ICD-10-CM

## 2015-11-25 DIAGNOSIS — I6381 Other cerebral infarction due to occlusion or stenosis of small artery: Secondary | ICD-10-CM

## 2015-11-25 NOTE — Progress Notes (Signed)
[facility]  PATIENT: Roger [name] DOB: 1931/01/01   REASON FOR VISIT: Hospital follow-up for stroke HISTORY FROM:patient, daughter  [name] and wife    HISTORY OF PRESENT ILLNESS:Roger [name] is an 82 y.o. male patient who was brought into the emergency room by the EMS for an acute onset of left facial weakness, and left sided limb weakness and slurred speech on 10/04/15.  Patient has dementia, and is a poor historian. He was unable to specify at one time he was last normal. He started saying that 2 days ago when he went to church, he felt normal but was unable to clarify if he woke up with no symptoms this morning. Patient lives with his wife at home. His wife was not available in the emergency room during  evaluation.The EMS who brought the patient, they were told that patient's symptoms started around 11 AM although there was no corroborative history available from anyone else. The initial blood pressure recorded by the EMS was systolic of 260. MRI of the brain Moderate-size acute nonhemorrhagic infarct extends from theposterior right lenticular nucleus extending to posterior right corona radiata.Remote small cerebellar infarcts bilaterally, right paracentral pontine infarct, basal ganglia infarcts, left thalamic infarct and small infarcts centrum semi ovale greater on the right.Moderate chronic microvascular changes.Global moderate atrophy without hydrocephalus.CT of the head and neck with diffuse intracranial atherosclerosis. LDL 125 hemoglobin A1c 8.1. He was on aspirin prior to admission now on Plavix. He spent several weeks in inpatient rehabilitation and is now a skilled facility receiving physical therapy speech therapy and occupational therapy. He continues to have significant weakness on the left side. He has some mild drooling. He is on thickened liquids. He has not had further stroke or TIA symptoms. The goal is to take him back home  at some point.  Prior to this  admission he had been living independently in his on home.    REVIEW OF SYSTEMS: Full 14 system review of systems performed and notable only for those listed, all others are neg:  Constitutional: neg  Cardiovascular: neg Ear/Nose/Throat: neg  Skin: neg Eyes: neg Respiratory: neg Gastroitestinal:  constipation  Hematology/Lymphatic:  easy bruising  Endocrine: neg Musculoskeletal: gait abnormality Allergy/Immunology: neg Neurological: memory loss weakness difficulty swallowing Psychiatric: neg Sleep :  Insomnia at times  ALLERGIES: Allergies  Allergen Reactions  . Aricept [Donepezil Hcl]     Nightmares, Hallucinations  . Namenda [Memantine Hcl]     Nightmares, Hallucinations    HOME MEDICATIONS: Outpatient Prescriptions Prior to Visit  Medication Sig Dispense Refill  . atorvastatin (LIPITOR) 20 MG tablet Take 1 tablet (20 mg total) by mouth daily at 6 PM. 30 tablet 0  . B Complex-C (B-COMPLEX WITH VITAMIN C) tablet Take 1 tablet by mouth daily.    . cholecalciferol (VITAMIN D) 1000 units tablet Take 1,000 Units by mouth daily.    . clopidogrel (PLAVIX) 75 MG tablet Take 1 tablet (75 mg total) by mouth daily. 30 tablet 0  . COLCRYS 0.6 MG tablet Take 0.6 mg by mouth daily. Reported on 10/04/2015    . docusate sodium (COLACE) 100 MG capsule Take 2 capsules (200 mg total) by mouth at bedtime. 10 capsule 0  . finasteride (PROSCAR) 5 MG tablet Take 5 mg by mouth daily at 12 noon.    . fluticasone (FLONASE) 50 MCG/ACT nasal spray Place into both nostrils daily.    Marland Kitchen glimepiride (AMARYL) 4 MG tablet Take 4 mg by mouth daily with breakfast.    .  lisinopril (PRINIVIL,ZESTRIL) 20 MG tablet Take 20 mg by mouth 2 (two) times daily.    . Melatonin 10 MG TABS Take 1 tablet by mouth at bedtime.    . metFORMIN (GLUCOPHAGE-XR) 500 MG 24 hr tablet Take 250 mg by mouth 2 (two) times daily.     . tamsulosin (FLOMAX) 0.4 MG CAPS capsule Take 0.4 mg by mouth at bedtime.    Marland Kitchen tiZANidine  (ZANAFLEX) 4 MG capsule Take 8 mg by mouth 3 (three) times daily.    . vitamin B-12 (CYANOCOBALAMIN) 1000 MCG tablet Take 1,000 mcg by mouth daily.     No facility-administered medications prior to visit.    PAST MEDICAL HISTORY: Past Medical History  Diagnosis Date  . Diabetes (HCC)   . Hypertension   . Gout   . ED (erectile dysfunction)   . Heart murmur   . Kidney stones   . Dysphagia   . Benign prostate hyperplasia   . Hyperlipidemia   . 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 Alzheimer disease     PAST SURGICAL HISTORY: Past Surgical History  Procedure Laterality Date  . Tonsillectomy    . Appendectomy    . Lithotripsy    . Uvulopalatopharyngoplasty      for snoring    FAMILY HISTORY: Family History  Problem Relation Age of Onset  . Ataxia Neg Hx   . Chorea Neg Hx   . Dementia Neg Hx   . Mental retardation Neg Hx   . Migraines Neg Hx   . Multiple sclerosis Neg Hx   . Neurofibromatosis Neg Hx   . Neuropathy Neg Hx   . Parkinsonism Neg Hx   . Seizures Neg Hx   . Stroke Neg Hx   . Atrial fibrillation Mother   . Atrial fibrillation Maternal Aunt     SOCIAL HISTORY: Social History   Social History  . Marital Status: Married    Spouse Name: N/A  . Number of Children: N/A  . Years of Education: N/A   Occupational History  . retired     Management consultant   Social History Main Topics  . Smoking status: Never Smoker   . Smokeless tobacco: Not on file  . Alcohol Use: No  . Drug Use: No  . Sexual Activity: Not on file   Other Topics Concern  . Not on file   Social History Narrative   Residing at [facility].      PHYSICAL EXAM  Filed Vitals:   11/25/15 1251  BP: 92/55  Pulse: 87   There is no weight on file to calculate BMI.  Generalized: Well developed, in no acute distress  Head: normocephalic and atraumatic,. Oropharynx benign  Neck: Supple, no carotid bruits  Cardiac:  Regular rate rhythm,  Soft murmur  Musculoskeletal: No deformity   Neurological examination   Mentation: Alert oriented to time, place, history taking.Follows all commands ,speech  With moderate dysarthria.   Cranial nerve II-XII: Fundoscopic exam  Deferred Pupils were equal round reactive to light extraocular movements were full, visual field were full on confrontational test. Facial sensation  Normal  Mild left facial droop.Hearing was intact to finger rubbing bilaterally. Uvula tongue midline. head turning and shoulder shrug were normal and symmetric.Tongue protrusion into cheek strength was normal. Motor: normal bulk and tone, full strength in the BUE, BLE,  On the right , left upper extremity 0 out of 5 left lower extremity 1 out of 5 Sensory:  normal and symmetric to light touch, pinprick, and  Vibration, Coordination: finger-nose-finger, heel-to-shin bilaterally, on the right Reflexes:  1+ upper lower and symmetric plantar responses were flexor bilaterally. Gait and Station:  Not tested in wheelchair DIAGNOSTIC DATA (LABS, IMAGING, TESTING) - I reviewed patient records, labs, notes, testing and imaging myself where available.  Lab Results  Component Value Date   WBC 5.3 10/25/2015   HGB 12.1* 10/25/2015   HCT 37.1* 10/25/2015   MCV 91.8 10/25/2015   PLT 176 10/25/2015      Component Value Date/Time   NA 144 10/25/2015 0549   K 4.7 10/25/2015 0549   CL 103 10/25/2015 0549   CO2 31 10/25/2015 0549   GLUCOSE 130* 10/25/2015 0549   BUN 22* 10/25/2015 0549   CREATININE 1.34* 10/25/2015 0549   CALCIUM 9.6 10/25/2015 0549   PROT 6.4* 10/07/2015 1530   ALBUMIN 3.5 10/07/2015 1530   AST 27 10/07/2015 1530   ALT 14* 10/07/2015 1530   ALKPHOS 71 10/07/2015 1530   BILITOT 0.5 10/07/2015 1530   GFRNONAA 48* 10/25/2015 0549   GFRAA 55* 10/25/2015 0549   Lab Results  Component Value Date   CHOL 196 10/05/2015   HDL 45 10/05/2015   LDLCALC 125* 10/05/2015   TRIG 130 10/05/2015     CHOLHDL 4.4 10/05/2015   Lab Results  Component Value Date   HGBA1C 8.1* 10/05/2015   Lab Results  Component Value Date   VITAMINB12 574 10/05/2015   Lab Results  Component Value Date   TSH 2.225 10/04/2015      ASSESSMENT AND PLAN  82 y.o. year old male  has a past medical history of Diabetes (HCC); Hypertension; Gout; ED (erectile dysfunction); Heart murmur; Kidney stones; Dysphagia; Benign prostate hyperplasia; Hyperlipidemia; Hemiplegia and hemiparesis following cerebral infarction affecting left nondominant side (HCC); and Alzheimer disease. here   For hospital follow-up. Records reviewed.   PLAN: Management of stroke risk factors  ContinuePlavix for secondary stroke prevention  Continue Lipitor for secondary stroke prevention.  Labs to be followed by primary care Dr. Kevan Ny Continue PT OT and speech therapy  Hemoglobin A1c less than 7,  Admission was 8.1  Keep well hydrated prevent dehydration  additional 15 minutes spent reviewing MRI and plan of treatment with the daughter and wife  follow-up in 3 monthsVst time [address], [facility], [facility], [facility]  [facility] [address] [place], [place] [address] [phone]

## 2015-11-25 NOTE — Patient Instructions (Signed)
Per skilled nursing sheet 

## 2015-11-26 NOTE — Progress Notes (Signed)
I reviewed above note and agree with the assessment and plan. After 3 months follow up, he can be sent back to his neurologist Dr. Arta Bruce at [facility]. Thanks.  Marvel Plan, MD PhD Stroke Neurology 11/26/2015 6:34 PM

## 2016-03-01 ENCOUNTER — Encounter: Payer: Self-pay | Admitting: Nurse Practitioner

## 2016-03-01 ENCOUNTER — Ambulatory Visit (INDEPENDENT_AMBULATORY_CARE_PROVIDER_SITE_OTHER): Payer: Medicare Other | Admitting: Nurse Practitioner

## 2016-03-01 VITALS — BP 122/75 | HR 89 | Ht 69.0 in

## 2016-03-01 DIAGNOSIS — I69359 Hemiplegia and hemiparesis following cerebral infarction affecting unspecified side: Secondary | ICD-10-CM

## 2016-03-01 DIAGNOSIS — I639 Cerebral infarction, unspecified: Secondary | ICD-10-CM | POA: Diagnosis not present

## 2016-03-01 DIAGNOSIS — I10 Essential (primary) hypertension: Secondary | ICD-10-CM

## 2016-03-01 DIAGNOSIS — E118 Type 2 diabetes mellitus with unspecified complications: Secondary | ICD-10-CM | POA: Diagnosis not present

## 2016-03-01 DIAGNOSIS — G8194 Hemiplegia, unspecified affecting left nondominant side: Secondary | ICD-10-CM

## 2016-03-01 DIAGNOSIS — I63312 Cerebral infarction due to thrombosis of left middle cerebral artery: Secondary | ICD-10-CM | POA: Diagnosis not present

## 2016-03-01 DIAGNOSIS — IMO0002 Reserved for concepts with insufficient information to code with codable children: Secondary | ICD-10-CM

## 2016-03-01 DIAGNOSIS — F0390 Unspecified dementia without behavioral disturbance: Secondary | ICD-10-CM | POA: Diagnosis not present

## 2016-03-01 DIAGNOSIS — I6381 Other cerebral infarction due to occlusion or stenosis of small artery: Secondary | ICD-10-CM

## 2016-03-01 NOTE — Patient Instructions (Signed)
Continue Plavix for secondary stroke prevention  Continue Lipitor for secondary stroke prevention.  Labs to be followed by primary care Dr. Kevan Ny Continue HEP   Hemoglobin A1c less than 7,  Admission was 8.1  Keep well hydrated prevent dehydration   follow-up in 6 months

## 2016-03-01 NOTE — Progress Notes (Signed)
[facility]  PATIENT: Roger [name] DOB: 1930/09/01   REASON FOR VISIT: Hospital follow-up for stroke HISTORY FROM:patient, daughter  Roger [name]    HISTORY OF PRESENT ILLNESS:UPDATE 07/26/2017CM Roger [name], 82 year old male returns for follow-up. He had acute nonhemorrhagic infarct extending from the posterior right lenticular nucleus extending to posterior right corona radiata.Remote small cerebellar infarcts bilaterally, right paracentral pontine infarct, basal ganglia infarcts, left thalamic infarct and small infarcts centrum semi ovale greater on the right.Moderate chronic microvascular changes.Global moderate atrophy without hydrocephalus. He is currently on Plavix and Lipitor for secondary stroke prevention. He has labs to be done next week. He was in a skilled nursing facility and has been home for about one week. Daughter who accompanies him today makes sure he does his home exercise program every day. Daughter is concerned that elderly wife is trying to transfer patient by herself and doesn't want help or assistance from her. He continues to have significant weakness on the left side. He has some mild drooling. He is on thickened liquids. Less coughing spells according to daughter, gets chocked rarely. He returns for reevaluation    HISTORY 11/25/15 [name] is an 82 y.o. male patient who was brought into the emergency room by the EMS for an acute onset of left facial weakness, and left sided limb weakness and slurred speech on 10/04/15.  Patient has dementia, and is a poor historian. He was unable to specify at one time he was last normal. He started saying that 2 days ago when he went to church, he felt normal but was unable to clarify if he woke up with no symptoms this morning. Patient lives with his wife at home. His wife was not available in the emergency room during  evaluation.The EMS who brought the patient, they were told that patient's symptoms started around 11  AM although there was no corroborative history available from anyone else. The initial blood pressure recorded by the EMS was systolic of 260. MRI of the brain Moderate-size acute nonhemorrhagic infarct extends from theposterior right lenticular nucleus extending to posterior right corona radiata.Remote small cerebellar infarcts bilaterally, right paracentral pontine infarct, basal ganglia infarcts, left thalamic infarct and small infarcts centrum semi ovale greater on the right.Moderate chronic microvascular changes.Global moderate atrophy without hydrocephalus.CT of the head and neck with diffuse intracranial atherosclerosis. LDL 125 hemoglobin A1c 8.1. He was on aspirin prior to admission now on Plavix. He spent several weeks in inpatient rehabilitation and is now a skilled facility receiving physical therapy speech therapy and occupational therapy. He continues to have significant weakness on the left side. He has some mild drooling. He is on thickened liquids. He has not had further stroke or TIA symptoms. The goal is to take him back home  at some point.  Prior to this admission he had been living independently in his on home.    REVIEW OF SYSTEMS: Full 14 system review of systems performed and notable only for those listed, all others are neg:  Constitutional: neg  Cardiovascular: neg Ear/Nose/Throat: neg  Skin: neg Eyes: neg Respiratory: neg Gastroitestinal:  constipation  Hematology/Lymphatic:  easy bruising  Endocrine: neg Musculoskeletal: gait abnormality Allergy/Immunology: neg Neurological: memory loss weakness difficulty swallowing Psychiatric: neg Sleep :  Insomnia at times  ALLERGIES: Allergies  Allergen Reactions  . Aricept [Donepezil Hcl]     Nightmares, Hallucinations  . Namenda [Memantine Hcl]     Nightmares, Hallucinations    HOME MEDICATIONS: Outpatient Medications Prior to Visit  Medication Sig Dispense  Refill  . atorvastatin (LIPITOR) 20 MG tablet Take 1  tablet (20 mg total) by mouth daily at 6 PM. 30 tablet 0  . B Complex-C (B-COMPLEX WITH VITAMIN C) tablet Take 1 tablet by mouth daily.    . clopidogrel (PLAVIX) 75 MG tablet Take 1 tablet (75 mg total) by mouth daily. 30 tablet 0  . COLCRYS 0.6 MG tablet Take 0.6 mg by mouth daily. Reported on 10/04/2015    . docusate sodium (COLACE) 100 MG capsule Take 2 capsules (200 mg total) by mouth at bedtime. 10 capsule 0  . finasteride (PROSCAR) 5 MG tablet Take 5 mg by mouth daily at 12 noon.    . fluticasone (FLONASE) 50 MCG/ACT nasal spray Place into both nostrils daily.    Marland Kitchen glimepiride (AMARYL) 4 MG tablet Take 4 mg by mouth daily with breakfast.    . lisinopril (PRINIVIL,ZESTRIL) 20 MG tablet Take 20 mg by mouth 2 (two) times daily.    . Melatonin 10 MG TABS Take 1 tablet by mouth at bedtime.    . metFORMIN (GLUCOPHAGE-XR) 500 MG 24 hr tablet Take 250 mg by mouth 2 (two) times daily.     . cholecalciferol (VITAMIN D) 1000 units tablet Take 1,000 Units by mouth daily.    . tamsulosin (FLOMAX) 0.4 MG CAPS capsule Take 0.4 mg by mouth at bedtime.    Marland Kitchen tiZANidine (ZANAFLEX) 4 MG capsule Take 8 mg by mouth 3 (three) times daily.    . vitamin B-12 (CYANOCOBALAMIN) 1000 MCG tablet Take 1,000 mcg by mouth daily.     No facility-administered medications prior to visit.     PAST MEDICAL HISTORY: Past Medical History:  Diagnosis Date  . Alzheimer disease   . Benign prostate hyperplasia   . Diabetes (HCC)   . Dysphagia   . ED (erectile dysfunction)   . Gout   . Heart murmur   . 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 Hyperlipidemia   . Hypertension   . Kidney stones     PAST SURGICAL HISTORY: Past Surgical History:  Procedure Laterality Date  . APPENDECTOMY    . LITHOTRIPSY    . TONSILLECTOMY    . UVULOPALATOPHARYNGOPLASTY     for snoring    FAMILY HISTORY: Family History  Problem Relation Age of Onset  . Atrial fibrillation  Mother   . Atrial fibrillation Maternal Aunt   . Ataxia Neg Hx   . Chorea Neg Hx   . Dementia Neg Hx   . Mental retardation Neg Hx   . Migraines Neg Hx   . Multiple sclerosis Neg Hx   . Neurofibromatosis Neg Hx   . Neuropathy Neg Hx   . Parkinsonism Neg Hx   . Seizures Neg Hx   . Stroke Neg Hx     SOCIAL HISTORY: Social History   Social History  . Marital status: Married    Spouse name: N/A  . Number of children: N/A  . Years of education: N/A   Occupational History  . retired     Management consultant   Social History Main Topics  . Smoking status: Never Smoker  . Smokeless tobacco: Never Used  . Alcohol use No  . Drug use: No  . Sexual activity: Not on file   Other Topics Concern  . Not on file   Social History Narrative   Residing at [facility].      PHYSICAL EXAM  Vitals:   03/01/16 1050  BP: 122/75  Pulse: 89  Height:  (1.753 m)   There is no height or weight on file to calculate BMI.  Generalized: Well developed, in no acute distress  Head: normocephalic and atraumatic,. Oropharynx benign  Neck: Supple, no carotid bruits  Cardiac: Regular rate rhythm,  Soft murmur  Musculoskeletal: No deformity   Neurological examination   Mentation: Alert oriented to time, place, history taking.Follows all commands ,speech  With moderate dysarthria. Soft voice  Cranial nerve II-XII:  Pupils were equal round reactive to light extraocular movements were full, visual field were full on confrontational test. Facial sensation  Normal  Mild left facial droop.Hearing was intact to finger rubbing bilaterally. Uvula tongue midline. head turning and shoulder shrug were normal and symmetric.Tongue protrusion into cheek strength was normal. Motor: normal bulk and tone, full strength in the BUE, BLE,  On the right , left upper extremity 0 out of 5 left lower extremity 1 out of 5 Sensory: normal and symmetric to light touch, pinprick, and  Vibration, Coordination:  finger-nose-finger, heel-to-shin bilaterally, on the right Reflexes:  1+ upper lower and symmetric plantar responses were flexor bilaterally. Gait and Station:  Not tested in wheelchair DIAGNOSTIC DATA (LABS, IMAGING, TESTING) - I reviewed patient records, labs, notes, testing and imaging myself where available.  Lab Results  Component Value Date   WBC 5.3 10/25/2015   HGB 12.1 (L) 10/25/2015   HCT 37.1 (L) 10/25/2015   MCV 91.8 10/25/2015   PLT 176 10/25/2015      Component Value Date/Time   NA 144 10/25/2015 0549   K 4.7 10/25/2015 0549   CL 103 10/25/2015 0549   CO2 31 10/25/2015 0549   GLUCOSE 130 (H) 10/25/2015 0549   BUN 22 (H) 10/25/2015 0549   CREATININE 1.34 (H) 10/25/2015 0549   CALCIUM 9.6 10/25/2015 0549   PROT 6.4 (L) 10/07/2015 1530   ALBUMIN 3.5 10/07/2015 1530   AST 27 10/07/2015 1530   ALT 14 (L) 10/07/2015 1530   ALKPHOS 71 10/07/2015 1530   BILITOT 0.5 10/07/2015 1530   GFRNONAA 48 (L) 10/25/2015 0549   GFRAA 55 (L) 10/25/2015 0549   Lab Results  Component Value Date   CHOL 196 10/05/2015   HDL 45 10/05/2015   LDLCALC 125 (H) 10/05/2015   TRIG 130 10/05/2015   CHOLHDL 4.4 10/05/2015   Lab Results  Component Value Date   HGBA1C 8.1 (H) 10/05/2015   Lab Results  Component Value Date   VITAMINB12 574 10/05/2015   Lab Results  Component Value Date   TSH 2.225 10/04/2015      ASSESSMENT AND PLAN  82 y.o. year old male  has a past medical history of Diabetes (HCC); Hypertension; Gout; ED (erectile dysfunction); Heart murmur; Kidney stones; Dysphagia; Benign prostate hyperplasia; Hyperlipidemia; Hemiplegia and hemiparesis following cerebral infarction affecting left nondominant side (HCC); and Alzheimer disease. here   For follow-up    PLAN: Continue Plavix for secondary stroke prevention  Continue Lipitor for secondary stroke prevention.  Labs to be followed by primary care Dr. Kevan Ny Continue HEP   Hemoglobin A1c less than 7,  Admission was  8.1  Keep well hydrated prevent dehydration  Follow-up in 6 months Nilda Riggs, [facility], [facility], APRN  [facility] [address], [address] [place], [place] [address] [phone]

## 2016-03-01 NOTE — Progress Notes (Signed)
I reviewed above note and agree with the assessment and plan.  Marvel Plan, MD PhD Stroke Neurology 03/01/2016 6:42 PM

## 2016-03-24 ENCOUNTER — Observation Stay (HOSPITAL_COMMUNITY)
Admission: EM | Admit: 2016-03-24 | Discharge: 2016-03-27 | Disposition: A | Discharge status: Home / Self Care | Payer: Medicare Other | Attending: Internal Medicine | Admitting: Internal Medicine

## 2016-03-24 ENCOUNTER — Emergency Department (HOSPITAL_COMMUNITY): Payer: Medicare Other

## 2016-03-24 ENCOUNTER — Encounter (HOSPITAL_COMMUNITY): Payer: Self-pay | Admitting: Emergency Medicine

## 2016-03-24 DIAGNOSIS — Z7984 Long term (current) use of oral hypoglycemic drugs: Secondary | ICD-10-CM | POA: Diagnosis not present

## 2016-03-24 DIAGNOSIS — R4182 Altered mental status, unspecified: Secondary | ICD-10-CM | POA: Diagnosis present

## 2016-03-24 DIAGNOSIS — R531 Weakness: Secondary | ICD-10-CM | POA: Diagnosis not present

## 2016-03-24 DIAGNOSIS — Z8673 Personal history of transient ischemic attack (TIA), and cerebral infarction without residual deficits: Secondary | ICD-10-CM | POA: Diagnosis not present

## 2016-03-24 DIAGNOSIS — D72829 Elevated white blood cell count, unspecified: Secondary | ICD-10-CM

## 2016-03-24 DIAGNOSIS — I10 Essential (primary) hypertension: Secondary | ICD-10-CM | POA: Insufficient documentation

## 2016-03-24 DIAGNOSIS — E1159 Type 2 diabetes mellitus with other circulatory complications: Secondary | ICD-10-CM

## 2016-03-24 DIAGNOSIS — L8990 Pressure ulcer of unspecified site, unspecified stage: Secondary | ICD-10-CM | POA: Insufficient documentation

## 2016-03-24 DIAGNOSIS — R509 Fever, unspecified: Secondary | ICD-10-CM | POA: Diagnosis not present

## 2016-03-24 DIAGNOSIS — Z7902 Long term (current) use of antithrombotics/antiplatelets: Secondary | ICD-10-CM | POA: Diagnosis not present

## 2016-03-24 DIAGNOSIS — M6289 Other specified disorders of muscle: Secondary | ICD-10-CM

## 2016-03-24 DIAGNOSIS — N390 Urinary tract infection, site not specified: Secondary | ICD-10-CM

## 2016-03-24 DIAGNOSIS — E119 Type 2 diabetes mellitus without complications: Secondary | ICD-10-CM | POA: Diagnosis not present

## 2016-03-24 DIAGNOSIS — Z79899 Other long term (current) drug therapy: Secondary | ICD-10-CM | POA: Diagnosis not present

## 2016-03-24 DIAGNOSIS — G309 Alzheimer's disease, unspecified: Secondary | ICD-10-CM | POA: Diagnosis not present

## 2016-03-24 DIAGNOSIS — I63312 Cerebral infarction due to thrombosis of left middle cerebral artery: Secondary | ICD-10-CM

## 2016-03-24 LAB — COMPREHENSIVE METABOLIC PANEL
ALBUMIN: 3.8 g/dL (ref 3.5–5.0)
ALK PHOS: 80 U/L (ref 38–126)
ALT: 15 U/L — ABNORMAL LOW (ref 17–63)
AST: 17 U/L (ref 15–41)
Anion gap: 7 (ref 5–15)
BILIRUBIN TOTAL: 0.6 mg/dL (ref 0.3–1.2)
BUN: 25 mg/dL — AB (ref 6–20)
CALCIUM: 9.7 mg/dL (ref 8.9–10.3)
CO2: 29 mmol/L (ref 22–32)
CREATININE: 1.26 mg/dL — AB (ref 0.61–1.24)
Chloride: 103 mmol/L (ref 101–111)
GFR calc Af Amer: 59 mL/min — ABNORMAL LOW (ref >60)
GFR calc non Af Amer: 51 mL/min — ABNORMAL LOW (ref >60)
GLUCOSE: 141 mg/dL — AB (ref 65–99)
Potassium: 3.9 mmol/L (ref 3.5–5.1)
Sodium: 139 mmol/L (ref 135–145)
TOTAL PROTEIN: 6.7 g/dL (ref 6.5–8.1)

## 2016-03-24 LAB — CBG MONITORING, ED: Glucose-Capillary: 115 mg/dL — ABNORMAL HIGH (ref 65–99)

## 2016-03-24 LAB — CBC WITH DIFFERENTIAL/PLATELET
BASOS ABS: 0 10*3/uL (ref 0.0–0.1)
BASOS PCT: 0 %
EOS PCT: 1 %
Eosinophils Absolute: 0.1 10*3/uL (ref 0.0–0.7)
HCT: 41.1 % (ref 39.0–52.0)
Hemoglobin: 13.3 g/dL (ref 13.0–17.0)
Lymphocytes Relative: 8 %
Lymphs Abs: 0.9 10*3/uL (ref 0.7–4.0)
MCH: 30.2 pg (ref 26.0–34.0)
MCHC: 32.4 g/dL (ref 30.0–36.0)
MCV: 93.4 fL (ref 78.0–100.0)
MONO ABS: 0.8 10*3/uL (ref 0.1–1.0)
Monocytes Relative: 7 %
Neutro Abs: 9.5 10*3/uL — ABNORMAL HIGH (ref 1.7–7.7)
Neutrophils Relative %: 84 %
PLATELETS: 196 10*3/uL (ref 150–400)
RBC: 4.4 MIL/uL (ref 4.22–5.81)
RDW: 13.8 % (ref 11.5–15.5)
WBC: 11.4 10*3/uL — ABNORMAL HIGH (ref 4.0–10.5)

## 2016-03-24 LAB — I-STAT VENOUS BLOOD GAS, ED
ACID-BASE EXCESS: 4 mmol/L — AB (ref 0.0–2.0)
Bicarbonate: 29.1 mEq/L — ABNORMAL HIGH (ref 20.0–24.0)
O2 Saturation: 36 %
TCO2: 30 mmol/L (ref 0–100)
pCO2, Ven: 45.2 mmHg (ref 45.0–50.0)
pH, Ven: 7.417 — ABNORMAL HIGH (ref 7.250–7.300)
pO2, Ven: 21 mmHg — ABNORMAL LOW (ref 31.0–45.0)

## 2016-03-24 LAB — URINALYSIS, ROUTINE W REFLEX MICROSCOPIC
BILIRUBIN URINE: NEGATIVE
Glucose, UA: NEGATIVE mg/dL
KETONES UR: NEGATIVE mg/dL
NITRITE: POSITIVE — AB
PH: 5.5 (ref 5.0–8.0)
Protein, ur: NEGATIVE mg/dL
Specific Gravity, Urine: 1.02 (ref 1.005–1.030)

## 2016-03-24 LAB — URINE MICROSCOPIC-ADD ON

## 2016-03-24 LAB — PROTIME-INR
INR: 1.02
Prothrombin Time: 13.4 seconds (ref 11.4–15.2)

## 2016-03-24 LAB — I-STAT CG4 LACTIC ACID, ED: Lactic Acid, Venous: 0.88 mmol/L (ref 0.5–1.9)

## 2016-03-24 MED ORDER — ACETAMINOPHEN 325 MG PO TABS
650.0000 mg | ORAL_TABLET | Freq: Once | ORAL | Status: AC
  Administered 2016-03-24: 650 mg via ORAL
  Filled 2016-03-24: qty 2

## 2016-03-24 MED ORDER — SODIUM CHLORIDE 0.9 % IV BOLUS (SEPSIS)
30.0000 mL/kg | Freq: Once | INTRAVENOUS | Status: AC
  Administered 2016-03-24: 1770 mL via INTRAVENOUS

## 2016-03-24 MED ORDER — DEXTROSE 5 % IV SOLN
1.0000 g | Freq: Once | INTRAVENOUS | Status: AC
  Administered 2016-03-24: 1 g via INTRAVENOUS
  Filled 2016-03-24: qty 10

## 2016-03-24 MED ORDER — DEXTROSE 5 % IV SOLN: 1.0000 g | Freq: Once | INTRAVENOUS | Status: DC

## 2016-03-24 MED ORDER — SODIUM CHLORIDE 0.9 % IV BOLUS (SEPSIS): 1000.0000 mL | Freq: Once | INTRAVENOUS | Status: DC

## 2016-03-24 NOTE — ED Notes (Signed)
Mesner MD at bedside.  

## 2016-03-24 NOTE — ED Triage Notes (Signed)
In EMS from home, LNW 1200p, per wife pt has hx stroke normally has L sided deficits and aphasia, she noticed that speech has worsened today. Pt outside of window for "Code Stroke" to be called. Wife also states that she has been changing his brief more frequently, pt smells of urine and feels warm to touch.

## 2016-03-24 NOTE — ED Notes (Signed)
Admitting MD at bedside.

## 2016-03-24 NOTE — ED Notes (Signed)
Patient transported to CT 

## 2016-03-24 NOTE — ED Provider Notes (Signed)
I saw and evaluated the patient, reviewed the resident's note and I agree with the findings and plan.  82 yo M here with confusion, difficulty with ADLs worse than normal. Apparently was restless last night as well.  On my exam, appears well, hot to touch. Abdomen benign. Lungs clear and heart rrr no m/r/g.  UA with UTI, rocephin given. Plan for admission 2/2 functional decline along with UTI.    EKG Interpretation  Date/Time:  Friday March 24 2016 21:22:26 EDT Ventricular Rate:  94 PR Interval:    QRS Duration: 106 QT Interval:  357 QTC Calculation: 447 R Axis:   -25 Text Interpretation:  Sinus tachycardia Paired ventricular premature complexes Borderline short PR interval Probable left atrial enlargement Borderline left axis deviation Low voltage, extremity leads Nonspecific repol abnormality, diffuse leads Confirmed by [facility] MD, Libbey Duce [phone]([id number]) on 03/24/2016 9:41:54 PM         Marily MemosJason Glenroy Crossen, MD 03/24/16 2338

## 2016-03-24 NOTE — H&P (Signed)
History and Physical    Roger [name] WUJ:[id number] DOB: Aug 06, 1931 DOA: 03/24/2016  PCP: Pearla Dubonnet, MD   Patient coming from: Home  Chief Complaint: Increased weakness and slurred speech, urinary frequency  HPI: Roger [name] is a 82 y.o. gentleman with a history of CVA which left him with an expressive aphasia and residual left sided weakness, CKD, HTN, HLD, and DM who presents to the ED accompanied by his wife and daughter.  Family reports that he had gross hematuria on July 31st, which prompted presentation to his PCP.  He was not diagnosed with a UTI or put on antibiotics.  However, he was referred to Urology, and has an outpatient appointment next week.  However, last night he seemed to develop worsening weakness and increased slurred speech (different from his baseline since his stroke).  His wife has also noted urinary frequency, but the patient has not indicated dysuria.  He did not sleep well last night.  His wife reports temperature of 99 at home but no sweats of chills.  The patient has not indicated abdominal pain.  No nausea or vomiting.  The rising temperature prompted presentation to the ED today.    ED Course: Temperature in the ED 100.4.  WBC count 11.4.  Head CT negative for acute process.  Lactic acid level normal. U/A is positive for nitrites and many bacteria.  Blood and urine cultures are pending.  Chest xray concerning for perihilar opacities that could represent early pneumonia.  The patient has received IV fluids and IV Rocephin.  Hospitalist asked to admit for further management.  Review of Systems: Spends most of his time in a wheelchair because he requires and at least one person assist for ambulation since his stroke.  He had a fall on Tuesday.  No specific pain complaints since then.  Otherwise, 10 systems reviewed and negative except as stated in the HPI.   Past Medical History:  Diagnosis Date  . Alzheimer disease   . Benign prostate hyperplasia   .  Diabetes (HCC)   . Dysphagia   . ED (erectile dysfunction)   . Gout   . Heart murmur   . 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 Hyperlipidemia   . Hypertension   . Kidney stones   Of note, family DENIES Alzheimer's history.  Past Surgical History:  Procedure Laterality Date  . APPENDECTOMY    . LITHOTRIPSY    . TONSILLECTOMY    . UVULOPALATOPHARYNGOPLASTY     for snoring     reports that he has never smoked. He has never used smokeless tobacco. He reports that he does not drink alcohol or use drugs.  He is married.  He has an adult daughter.  His son is deceased.  Allergies  Allergen Reactions  . Aricept [Donepezil Hcl]     Nightmares, Hallucinations  . Namenda [Memantine Hcl]     Nightmares, Hallucinations    Family History  Problem Relation Age of Onset  . Atrial fibrillation Mother   . Atrial fibrillation Maternal Aunt   . Ataxia Neg Hx   . Chorea Neg Hx   . Dementia Neg Hx   . Mental retardation Neg Hx   . Migraines Neg Hx   . Multiple sclerosis Neg Hx   . Neurofibromatosis Neg Hx   . Neuropathy Neg Hx   . Parkinsonism Neg Hx   . Seizures Neg Hx   . Stroke Neg Hx      Prior  to Admission medications   Medication Sig Start Date End Date Taking? Authorizing Provider  atorvastatin (LIPITOR) 20 MG tablet Take 1 tablet (20 mg total) by mouth daily at 6 PM. 10/07/15   Penny Pia, MD  B Complex-C (B-COMPLEX WITH VITAMIN C) tablet Take 1 tablet by mouth daily.    Historical Provider, MD  clopidogrel (PLAVIX) 75 MG tablet Take 1 tablet (75 mg total) by mouth daily. 10/07/15   Penny Pia, MD  COLCRYS 0.6 MG tablet Take 0.6 mg by mouth daily. Reported on 10/04/2015 07/05/13   Historical Provider, MD  docusate sodium (COLACE) 100 MG capsule Take 2 capsules (200 mg total) by mouth at bedtime. 10/07/15   Penny Pia, MD  finasteride (PROSCAR) 5 MG tablet Take 5 mg by mouth daily at 12 noon. 08/26/15   Historical  Provider, MD  fluticasone (FLONASE) 50 MCG/ACT nasal spray Place into both nostrils daily.    Historical Provider, MD  glimepiride (AMARYL) 4 MG tablet Take 4 mg by mouth daily with breakfast.    Historical Provider, MD  lisinopril (PRINIVIL,ZESTRIL) 20 MG tablet Take 20 mg by mouth 2 (two) times daily.    Historical Provider, MD  Melatonin 10 MG TABS Take 1 tablet by mouth at bedtime.    Historical Provider, MD  metFORMIN (GLUCOPHAGE-XR) 500 MG 24 hr tablet Take 250 mg by mouth 2 (two) times daily.  07/08/13   Historical Provider, MD    Physical Exam: Vitals:   03/24/16 2140 03/24/16 2215 03/24/16 2230 03/24/16 2300  BP:  130/84 116/78 128/76  Pulse:  81 75 73  Resp:  14 13 15   Temp: 100.4 F (38 C)     TempSrc: Rectal     SpO2:  99% 99% 99%  Weight:      Height:          Constitutional: NAD, calm, comfortable, sleeping but arousable Vitals:   03/24/16 2140 03/24/16 2215 03/24/16 2230 03/24/16 2300  BP:  130/84 116/78 128/76  Pulse:  81 75 73  Resp:  14 13 15   Temp: 100.4 F (38 C)     TempSrc: Rectal     SpO2:  99% 99% 99%  Weight:      Height:       Eyes: PERRL, lids and conjunctivae normal ENMT: Mucous membranes are moist. Posterior pharynx clear of any exudate or lesions. Normal dentition.  Neck: normal appearance, supple, no masses Respiratory: clear to auscultation bilaterally, no wheezing, no crackles. Normal respiratory effort. No accessory muscle use.  Cardiovascular: Normal rate, regular rhythm, no murmurs / rubs / gallops. No extremity edema. 2+ pedal pulses. GI: abdomen is soft and compressible.  No distention.  No tenderness.  No masses palpated.  Bowel sounds are present. Musculoskeletal:  No joint deformity in upper and lower extremities. Impaired ROM on the left; developing contracture in LUE.   Normal muscle tone.  Skin: no rashes, warm and dry Neurologic: Known aphasia and left sided weakness/hemiparesis since CVA.  No new deficits. Psychiatric:  Sleeping but arousable.  Follows commands.  Insight impaired.       Labs on Admission: I have personally reviewed following labs and imaging studies  CBC:  Recent Labs Lab 03/24/16 2145  WBC 11.4*  NEUTROABS 9.5*  HGB 13.3  HCT 41.1  MCV 93.4  PLT 196   Basic Metabolic Panel:  Recent Labs Lab 03/24/16 2145  NA 139  K 3.9  CL 103  CO2 29  GLUCOSE 141*  BUN 25*  CREATININE 1.26*  CALCIUM 9.7   GFR: Estimated Creatinine Clearance: 37.1 mL/min (by C-G formula based on SCr of 1.26 mg/dL). Liver Function Tests:  Recent Labs Lab 03/24/16 2145  AST 17  ALT 15*  ALKPHOS 80  BILITOT 0.6  PROT 6.7  ALBUMIN 3.8   Coagulation Profile:  Recent Labs Lab 03/24/16 2145  INR 1.02   CBG:  Recent Labs Lab 03/24/16 2119  GLUCAP 115*   Urine analysis:    Component Value Date/Time   COLORURINE YELLOW 03/24/2016 2133   APPEARANCEUR CLOUDY (A) 03/24/2016 2133   LABSPEC 1.020 03/24/2016 2133   PHURINE 5.5 03/24/2016 2133   GLUCOSEU NEGATIVE 03/24/2016 2133   HGBUR SMALL (A) 03/24/2016 2133   BILIRUBINUR NEGATIVE 03/24/2016 2133   KETONESUR NEGATIVE 03/24/2016 2133   PROTEINUR NEGATIVE 03/24/2016 2133   NITRITE POSITIVE (A) 03/24/2016 2133   LEUKOCYTESUR MODERATE (A) 03/24/2016 2133   Sepsis labs: Lactic acid 0.88  Radiological Exams on Admission: Ct Head Wo Contrast  Result Date: 03/24/2016 CLINICAL DATA:  Increased confusion with disorientation.  Dementia. EXAM: CT HEAD WITHOUT CONTRAST TECHNIQUE: Contiguous axial images were obtained from the base of the skull through the vertex without intravenous contrast. COMPARISON:  10/05/2015 FINDINGS: Brain: No evidence of acute infarction, hemorrhage, hydrocephalus, extra-axial collection or mass lesion/mass effect. Advanced chronic microvascular disease with diffuse ischemic gliosis in the cerebral white matter and pons. Remote perforator infarct affecting the right internal capsule and corona radiata. Generalized  atrophy Vascular: Diffuse atherosclerotic calcification. No hyperdense vessel. Skull: Negative Sinuses/Orbits: Bilateral cataract resection. Senescent globe calcifications. No acute finding. Other: None IMPRESSION: 1. No acute finding. 2. Atrophy and extensive chronic microvascular disease. Electronically Signed   By: Marnee SpringJonathon  Watts M.D.   On: 03/24/2016 22:19   Dg Chest Portable 1 View  Result Date: 03/24/2016 CLINICAL DATA:  Worsening speech today. EXAM: PORTABLE CHEST 1 VIEW COMPARISON:  October 04, 2015 FINDINGS: There is a prominent skin fold over the lateral left chest. There are lung markings on both sides and there is no convincing evidence of pneumothorax on today's study. The heart, hila, and mediastinum are stable. Increased opacities are seen centrally in the infrahilar regions, right greater than left. No other interval changes or acute abnormalities. IMPRESSION: Increased opacity in the perihilar regions may represent developing infiltrate. The appearance is more coarsened than typical for edema. No other acute abnormalities. Recommend follow-up to resolution. Electronically Signed   By: Gerome Samavid  Williams III M.D   On: 03/24/2016 21:43    EKG: Independently reviewed. 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  Abnormal chest xray --Add IV azithromycin for empiric coverage for CAP --Urine streptococcal antigen --Pneumonia order set used  HTN --Continue home medications  DM --Hold metformin --SSI TID AC  Recent CVA --PT/OT/Speech evals   DVT prophylaxis: SCDs   Code Status: FULL Family Communication: Wife and  daughter at bedside in the ED at time of admission Disposition Plan: Expect he will go home when ready for discharge Consults called: NONE Admission status: Observation, telemetry   TIME SPENT: 70 minutes   Jerene Bearsarter,Borden Thune Harrison MD [facility] Pager [phone]  If 7PM-7AM, please contact night-coverage [email or web address] Password [facility]  03/24/2016, 11:23 PM

## 2016-03-24 NOTE — ED Provider Notes (Signed)
[facility] DEPT Provider Note   CSN: [id number] Arrival date & time: 03/24/16  2115     History   Chief Complaint Chief Complaint  Patient presents with  . Altered Mental Status    HPI Roger [name] is a 82 y.o. male.  HPI  Patient is a history of stroke with chronic left-sided deficits, presents for altered mental status and worsening left-sided weakness. Last known normal as approximate 8 hours prior to arrival. Family states that he had increasing left-sided weakness, with slurred speech. He also noticed increased urination. They note did not any fevers, shortness of breath. Upon arrival here he is at his mental baseline, but still weaker on the left normal.    Past Medical History:  Diagnosis Date  . Alzheimer disease   . Benign prostate hyperplasia   . Diabetes (HCC)   . Dysphagia   . ED (erectile dysfunction)   . Gout   . Heart murmur   . 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 Hyperlipidemia   . Hypertension   . Kidney stones     Patient Active Problem List   Diagnosis Date Noted  . Acute confusional state   . Dry mouth   . Insomnia   . Muscle spasticity   . AKI (acute kidney injury) (HCC)   . Benign essential HTN   . Dementia   . DM type 2 with diabetic peripheral neuropathy (HCC)   . Hemiplegia affecting non-dominant side, post-stroke (HCC)   . Dysphagia as late effect of cerebrovascular disease   . Basal ganglia infarction (HCC) 10/07/2015  . Acute left hemiparesis (HCC)   . Gait disturbance, post-stroke   . HLD (hyperlipidemia)   . Slurred speech 10/04/2015  . Malignant hypertension 10/04/2015  . Diabetes (HCC) 10/04/2015  . Stroke (HCC) 10/04/2015  . Left-sided weakness   . Stroke-like symptoms   . Physical deconditioning   . Diabetes mellitus with complication (HCC)   . Alzheimer's disease 08/14/2013    Past Surgical History:  Procedure Laterality Date  . APPENDECTOMY    .  LITHOTRIPSY    . TONSILLECTOMY    . UVULOPALATOPHARYNGOPLASTY     for snoring       Home Medications    Prior to Admission medications   Medication Sig Start Date End Date Taking? Authorizing Provider  atorvastatin (LIPITOR) 20 MG tablet Take 1 tablet (20 mg total) by mouth daily at 6 PM. 10/07/15   Penny Pia, MD  B Complex-C (B-COMPLEX WITH VITAMIN C) tablet Take 1 tablet by mouth daily.    Historical Provider, MD  clopidogrel (PLAVIX) 75 MG tablet Take 1 tablet (75 mg total) by mouth daily. 10/07/15   Penny Pia, MD  COLCRYS 0.6 MG tablet Take 0.6 mg by mouth daily. Reported on 10/04/2015 07/05/13   Historical Provider, MD  docusate sodium (COLACE) 100 MG capsule Take 2 capsules (200 mg total) by mouth at bedtime. 10/07/15   Penny Pia, MD  finasteride (PROSCAR) 5 MG tablet Take 5 mg by mouth daily at 12 noon. 08/26/15   Historical Provider, MD  fluticasone (FLONASE) 50 MCG/ACT nasal spray Place into both nostrils daily.    Historical Provider, MD  glimepiride (AMARYL) 4 MG tablet Take 4 mg by mouth daily with breakfast.    Historical Provider, MD  lisinopril (PRINIVIL,ZESTRIL) 20 MG tablet Take 20 mg by mouth 2 (two) times daily.    Historical Provider, MD  Melatonin 10 MG TABS Take 1 tablet  by mouth at bedtime.    Historical Provider, MD  metFORMIN (GLUCOPHAGE-XR) 500 MG 24 hr tablet Take 250 mg by mouth 2 (two) times daily.  07/08/13   Historical Provider, MD    Family History Family History  Problem Relation Age of Onset  . Atrial fibrillation Mother   . Atrial fibrillation Maternal Aunt   . Ataxia Neg Hx   . Chorea Neg Hx   . Dementia Neg Hx   . Mental retardation Neg Hx   . Migraines Neg Hx   . Multiple sclerosis Neg Hx   . Neurofibromatosis Neg Hx   . Neuropathy Neg Hx   . Parkinsonism Neg Hx   . Seizures Neg Hx   . Stroke Neg Hx     Social History Social History  Substance Use Topics  . Smoking status: Never Smoker  . Smokeless tobacco: Never Used  . Alcohol  use No     Allergies   Aricept [donepezil hcl] and Namenda [memantine hcl]   Review of Systems Review of Systems  Constitutional: Negative for chills and fever.  HENT: Negative for ear pain and sore throat.   Eyes: Negative for pain and visual disturbance.  Respiratory: Negative for cough and shortness of breath.   Cardiovascular: Negative for chest pain and palpitations.  Gastrointestinal: Negative for abdominal pain and vomiting.  Endocrine: Positive for polyuria.  Genitourinary: Negative for dysuria and hematuria.  Musculoskeletal: Negative for arthralgias and back pain.  Skin: Negative for color change and rash.  Neurological: Positive for speech difficulty and weakness. Negative for seizures and syncope.  All other systems reviewed and are negative.    Physical Exam Updated Vital Signs There were no vitals taken for this visit.  Physical Exam  Constitutional: He appears well-developed and well-nourished. He has a sickly appearance.  HENT:  Head: Normocephalic and atraumatic.  Eyes: Conjunctivae are normal.  Neck: Neck supple.  Cardiovascular: Normal rate and regular rhythm.   No murmur heard. Pulmonary/Chest: Effort normal and breath sounds normal. No respiratory distress.  Abdominal: Soft. There is no tenderness.  Musculoskeletal: He exhibits no edema.  Neurological: He is alert.  Left sided weakness, no slurred speech  Skin: Skin is warm and dry.  Psychiatric: He has a normal mood and affect.  Nursing note and vitals reviewed.    ED Treatments / Results  Labs (all labs ordered are listed, but only abnormal results are displayed) Labs Reviewed  CBG MONITORING, ED - Abnormal; Notable for the following:       Result Value   Glucose-Capillary 115 (*)    All other components within normal limits    EKG  EKG Interpretation None       Radiology No results found.  Procedures Procedures (including critical care time)  Medications Ordered in  ED Medications - No data to display   Initial Impression / Assessment and Plan / ED Course  I have reviewed the triage vital signs and the nursing notes.  Pertinent labs & imaging results that were available during my care of the patient were reviewed by me and considered in my medical decision making (see chart for details).  Clinical Course    Patient presented with functional decline. Less than normal as was 8 hours ago, code stroke was not called. Patient was stable on my initial assessment.  Labs and imaging ordered.  CT head was NAICA.  Labs showed leukocytosis, patient was febrile, and UA showed UTI, so abx started.  CXR also concerning for RLL  infiltrate.    Patient admitted to hospitalist for further care.  Final Clinical Impressions(s) / ED Diagnoses   Final diagnoses:  None    New Prescriptions New Prescriptions   No medications on file     Marcelina Morel, MD 03/24/16 2316    Marily Memos, MD 03/24/16 [phone]

## 2016-03-25 DIAGNOSIS — E119 Type 2 diabetes mellitus without complications: Secondary | ICD-10-CM | POA: Diagnosis not present

## 2016-03-25 DIAGNOSIS — R531 Weakness: Secondary | ICD-10-CM | POA: Diagnosis not present

## 2016-03-25 DIAGNOSIS — N390 Urinary tract infection, site not specified: Secondary | ICD-10-CM | POA: Diagnosis not present

## 2016-03-25 DIAGNOSIS — G309 Alzheimer's disease, unspecified: Secondary | ICD-10-CM | POA: Diagnosis not present

## 2016-03-25 LAB — VITAMIN B12: Vitamin B-12: 957 pg/mL — ABNORMAL HIGH (ref 180–914)

## 2016-03-25 LAB — CBC
HCT: 34.9 % — ABNORMAL LOW (ref 39.0–52.0)
Hemoglobin: 11 g/dL — ABNORMAL LOW (ref 13.0–17.0)
MCH: 29.6 pg (ref 26.0–34.0)
MCHC: 31.5 g/dL (ref 30.0–36.0)
MCV: 93.8 fL (ref 78.0–100.0)
PLATELETS: 167 10*3/uL (ref 150–400)
RBC: 3.72 MIL/uL — AB (ref 4.22–5.81)
RDW: 13.9 % (ref 11.5–15.5)
WBC: 7.4 10*3/uL (ref 4.0–10.5)

## 2016-03-25 LAB — BASIC METABOLIC PANEL
Anion gap: 9 (ref 5–15)
BUN: 21 mg/dL — AB (ref 6–20)
CO2: 25 mmol/L (ref 22–32)
CREATININE: 1.04 mg/dL (ref 0.61–1.24)
Calcium: 8.8 mg/dL — ABNORMAL LOW (ref 8.9–10.3)
Chloride: 108 mmol/L (ref 101–111)
Glucose, Bld: 153 mg/dL — ABNORMAL HIGH (ref 65–99)
Potassium: 3.6 mmol/L (ref 3.5–5.1)
SODIUM: 142 mmol/L (ref 135–145)

## 2016-03-25 LAB — STREP PNEUMONIAE URINARY ANTIGEN: Strep Pneumo Urinary Antigen: NEGATIVE

## 2016-03-25 LAB — GLUCOSE, CAPILLARY
GLUCOSE-CAPILLARY: 76 mg/dL (ref 65–99)
GLUCOSE-CAPILLARY: 80 mg/dL (ref 65–99)
GLUCOSE-CAPILLARY: 39 mg/dL — AB (ref 65–99)
GLUCOSE-CAPILLARY: 203 mg/dL — AB (ref 65–99)
Glucose-Capillary: 66 mg/dL (ref 65–99)
Glucose-Capillary: 125 mg/dL — ABNORMAL HIGH (ref 65–99)
Glucose-Capillary: 49 mg/dL — ABNORMAL LOW (ref 65–99)
Glucose-Capillary: 154 mg/dL — ABNORMAL HIGH (ref 65–99)

## 2016-03-25 MED ORDER — INSULIN ASPART 100 UNIT/ML ~~LOC~~ SOLN
0.0000 [IU] | Freq: Three times a day (TID) | SUBCUTANEOUS | Status: DC
  Administered 2016-03-25: 1 [IU] via SUBCUTANEOUS

## 2016-03-25 MED ORDER — DOCUSATE SODIUM 100 MG PO CAPS
200.0000 mg | ORAL_CAPSULE | Freq: Every day | ORAL | Status: DC
  Administered 2016-03-25 – 2016-03-26 (×3): 200 mg via ORAL
  Filled 2016-03-25 (×3): qty 2

## 2016-03-25 MED ORDER — ATORVASTATIN CALCIUM 20 MG PO TABS
20.0000 mg | ORAL_TABLET | Freq: Every day | ORAL | Status: DC
  Administered 2016-03-25 – 2016-03-27 (×3): 20 mg via ORAL
  Filled 2016-03-25 (×4): qty 1

## 2016-03-25 MED ORDER — CLOPIDOGREL BISULFATE 75 MG PO TABS
75.0000 mg | ORAL_TABLET | Freq: Every day | ORAL | Status: DC
  Administered 2016-03-25 – 2016-03-27 (×3): 75 mg via ORAL
  Filled 2016-03-25 (×3): qty 1

## 2016-03-25 MED ORDER — SODIUM CHLORIDE 0.9 % IV SOLN
INTRAVENOUS | Status: AC
  Administered 2016-03-25: 03:00:00 via INTRAVENOUS

## 2016-03-25 MED ORDER — GLUCOSE 40 % PO GEL
ORAL | Status: AC
  Administered 2016-03-25: 12:00:00
  Filled 2016-03-25: qty 1

## 2016-03-25 MED ORDER — ONDANSETRON HCL 4 MG PO TABS: 4.0000 mg | ORAL_TABLET | Freq: Four times a day (QID) | ORAL | Status: DC | PRN

## 2016-03-25 MED ORDER — DEXTROSE 5 % IV SOLN
500.0000 mg | Freq: Every day | INTRAVENOUS | Status: DC
  Administered 2016-03-25 (×2): 500 mg via INTRAVENOUS
  Filled 2016-03-25 (×3): qty 500

## 2016-03-25 MED ORDER — FLUTICASONE PROPIONATE 50 MCG/ACT NA SUSP
1.0000 | Freq: Every day | NASAL | Status: DC
  Administered 2016-03-25 – 2016-03-27 (×3): 1 via NASAL
  Filled 2016-03-25: qty 16

## 2016-03-25 MED ORDER — SODIUM CHLORIDE 0.9% FLUSH
3.0000 mL | Freq: Two times a day (BID) | INTRAVENOUS | Status: DC
  Administered 2016-03-25 – 2016-03-27 (×5): 3 mL via INTRAVENOUS

## 2016-03-25 MED ORDER — ACETAMINOPHEN 325 MG PO TABS
650.0000 mg | ORAL_TABLET | Freq: Four times a day (QID) | ORAL | Status: DC | PRN
  Administered 2016-03-26: 650 mg via ORAL
  Filled 2016-03-25: qty 2

## 2016-03-25 MED ORDER — MELATONIN 3 MG PO TABS
9.0000 mg | ORAL_TABLET | Freq: Every day | ORAL | Status: DC
  Administered 2016-03-25 – 2016-03-26 (×3): 9 mg via ORAL
  Filled 2016-03-25 (×4): qty 3

## 2016-03-25 MED ORDER — ACETAMINOPHEN 650 MG RE SUPP: 650.0000 mg | Freq: Four times a day (QID) | RECTAL | Status: DC | PRN

## 2016-03-25 MED ORDER — DEXTROSE 50 % IV SOLN
INTRAVENOUS | Status: AC
  Administered 2016-03-25: 50 mL
  Filled 2016-03-25: qty 50

## 2016-03-25 MED ORDER — ONDANSETRON HCL 4 MG/2ML IJ SOLN: 4.0000 mg | Freq: Four times a day (QID) | INTRAMUSCULAR | Status: DC | PRN

## 2016-03-25 MED ORDER — FINASTERIDE 5 MG PO TABS
5.0000 mg | ORAL_TABLET | Freq: Every day | ORAL | Status: DC
  Administered 2016-03-25 – 2016-03-27 (×3): 5 mg via ORAL
  Filled 2016-03-25 (×3): qty 1

## 2016-03-25 MED ORDER — COLCHICINE 0.6 MG PO TABS
0.6000 mg | ORAL_TABLET | Freq: Every day | ORAL | Status: DC
  Administered 2016-03-25 – 2016-03-27 (×3): 0.6 mg via ORAL
  Filled 2016-03-25 (×3): qty 1

## 2016-03-25 MED ORDER — LISINOPRIL 20 MG PO TABS
20.0000 mg | ORAL_TABLET | Freq: Two times a day (BID) | ORAL | Status: DC
  Administered 2016-03-25 – 2016-03-27 (×5): 20 mg via ORAL
  Filled 2016-03-25 (×5): qty 1

## 2016-03-25 NOTE — ED Notes (Signed)
Attempted to call report x2

## 2016-03-25 NOTE — Progress Notes (Signed)
New Admission Note: [facility]  Arrival Method: via stretcher Mental Orientation: alert and oriented Telemetry: initiated per order Assessment: Completed Skin: Intact IV: R arm x 2 Pain: Denies Tubes: None Safety Measures: Safety Fall Prevention Plan has been discussed  Admission: To be completed [place] Orientation: Patient has been orientated to the room, unit and staff.  Family: Daughter at bedside  Orders to be reviewed and implemented. Will continue to monitor the patient. Call light has been placed within reach and bed alarm has been activated.   Burley SaverKami Pacen Watford, BSN, RN-BC Phone: [phone]

## 2016-03-25 NOTE — Evaluation (Signed)
Clinical/Bedside Swallow Evaluation Patient Details  Name: Nichalous Theil MRN: [id number] Date of Birth: 1931-04-10  Today's Date: 03/25/2016 Time: SLP Start Time (ACUTE ONLY): 1055 SLP Stop Time (ACUTE ONLY): 1117 SLP Time Calculation (min) (ACUTE ONLY): 22 min  Past Medical History:  Past Medical History:  Diagnosis Date  . Alzheimer disease   . Benign prostate hyperplasia   . Diabetes (HCC)   . Dysphagia   . ED (erectile dysfunction)   . Gout   . Heart murmur   . 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 Hyperlipidemia   . Hypertension   . Kidney stones    Past Surgical History:  Past Surgical History:  Procedure Laterality Date  . APPENDECTOMY    . LITHOTRIPSY    . TONSILLECTOMY    . UVULOPALATOPHARYNGOPLASTY     for snoring   HPI:   Jachai Kenner is a 82 y.o. gentleman with a history of CVA which left him with an expressive aphasia and residual left sided weakness, CKD, HTN, HLD, and DM who presents to the ED accompanied by his wife and daughter.  Family reports that he had gross hematuria on July 31st, which prompted presentation to his PCP.  He was not diagnosed with a UTI or put on antibiotics.  However, he was referred to Urology, and has an outpatient appointment next week.  However, last night he seemed to develop worsening weakness and increased slurred speech (different from his baseline since his stroke).  His wife has also noted urinary frequency, but the patient has not indicated dysuria.  He did not sleep well last night.  His wife reports temperature of 99 at home but no sweats of chills.  The patient has not indicated abdominal pain.  No nausea or vomiting.  The rising temperature prompted presentation to the ED today.     Assessment / Plan / Recommendation Clinical Impression  Pt has recent history of stroke (09/2015). Pt had moderate oral/pharyngeal dysphagia resulting from stroke, but with therapy, daughter  reports patient's swallowing had returned to normal. Pt was tolerating a regular diet with thin liquid prior to this hospitalization. Pt presents today with a functional swallow. He takes additional time orally to form bolus, but appears to have a timely swallow with no s/s of aspiration. Pt is recommended to continue a regular diet with thin liquids, with medication given in applesauce (large pills to be crushed). RN notified of diet recommendations.     Aspiration Risk  Mild aspiration risk    Diet Recommendation Regular;Thin liquid   Liquid Administration via: Cup;Straw Medication Administration: Whole meds with puree Supervision: Staff to assist with self feeding Compensations: Slow rate;Small sips/bites Postural Changes: Seated upright at 90 degrees    Other  Recommendations Oral Care Recommendations: Oral care BID   Follow up Recommendations  None    Frequency and Duration min 1 x/week  1 week       Prognosis Prognosis for Safe Diet Advancement: Good      Swallow Study   General Date of Onset: 03/24/16 HPI:  Josua Manrriquez is a 82 y.o. gentleman with a history of CVA which left him with an expressive aphasia and residual left sided weakness, CKD, HTN, HLD, and DM who presents to the ED accompanied by his wife and daughter.  Family reports that he had gross hematuria on July 31st, which prompted presentation to his PCP.  He was not diagnosed with a UTI or put on antibiotics.  However, he was referred to Urology, and has an outpatient appointment next week.  However, last night he seemed to develop worsening weakness and increased slurred speech (different from his baseline since his stroke).  His wife has also noted urinary frequency, but the patient has not indicated dysuria.  He did not sleep well last night.  His wife reports temperature of 99 at home but no sweats of chills.  The patient has not indicated abdominal pain.  No nausea or vomiting.  The rising temperature prompted  presentation to the ED today.   Type of Study: Bedside Swallow Evaluation Previous Swallow Assessment: 10/27/15 Diet Prior to this Study: Regular;Thin liquids Temperature Spikes Noted: Yes Respiratory Status: Room air History of Recent Intubation: No Oral Cavity Assessment: Within Functional Limits Oral Care Completed by SLP: No Oral Cavity - Dentition: Adequate natural dentition Vision: Functional for self-feeding Self-Feeding Abilities: Able to feed self with adaptive devices (Pt has left sided weakness and unable to use left hand. ) Patient Positioning: Upright in chair Baseline Vocal Quality: Normal Volitional Cough: Strong Volitional Swallow: Able to elicit    Oral/Motor/Sensory Function Overall Oral Motor/Sensory Function: Within functional limits   Ice Chips Ice chips: Within functional limits Presentation: Cup;Spoon   Thin Liquid Thin Liquid: Within functional limits Presentation: Cup    Nectar Thick Nectar Thick Liquid: Not tested   Honey Thick Honey Thick Liquid: Not tested   Puree Puree: Within functional limits Presentation: Spoon   Solid   GO   Solid: Within functional limits Presentation: Self Fed    Functional Limitations: Swallowing Swallow Current Status (W0981(G8996): At least 1 percent but less than 20 percent impaired, limited or restricted Swallow Goal Status (X9147(G8997): 0 percent impaired, limited or restricted   Lindalou HoseSarah J. Azariel Banik, MA, CCC-SLP 03/25/2016 11:22 AM

## 2016-03-25 NOTE — ED Notes (Signed)
Attempted to call report

## 2016-03-25 NOTE — Progress Notes (Signed)
PROGRESS NOTE                                                                                                                                                                                                             Patient Demographics:    Roger [name], is a 82 y.o. male, DOB - 1930/12/20, ZOX:[id number]  Admit date - 03/24/2016   Admitting Physician Michael Litter, MD  Outpatient Primary MD for the patient is Pearla Dubonnet, MD  LOS - 0  Chief Complaint  Patient presents with  . Altered Mental Status       Brief Narrative   82 y.o. gentleman with a history of CVA which left him with an expressive aphasia and residual left sided weakness, CKD, HTN, HLD, and DM who presents to the ED With complaints of weakness, urinary frequency and lethargy, workup significant for UTI, and questionable early pneumonia   Subjective:    Roger [name] today has, No headache, No chest pain, No abdominal pain - No Nausea,No Cough - SOB.    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  Abnormal chest xray/CAP - Tinged with IV azithromycin and Rocephinfor empiric coverage for CAP - follow on Urine streptococcal antigen - follow on Pneumonia order set used  HTN --Continue home medications  DM - Hold metformin - Check A1c  - We'll hold insulin sliding scale during low CBGs  Recent CVA - Continue with Plavix - PT/OT/Speech evals  Hypertension - Continue with lisinopril    Code Status : Full  Family Communication  : Daughter at bedside  Disposition Plan  : Pending PT  evaluation.  Consults  :  None  Procedures  : None  DVT Prophylaxis  :  Lovenox   Lab Results  Component Value Date   PLT 167 03/25/2016    Antibiotics  :    Anti-infectives    Start     Dose/Rate Route Frequency  Ordered Stop   03/25/16 0245  azithromycin (ZITHROMAX) 500 mg in dextrose 5 % 250 mL IVPB     500 mg 250 mL/hr over 60 Minutes Intravenous Daily at bedtime 03/25/16 0206     03/24/16 2230  cefTRIAXone (ROCEPHIN) 1 g in dextrose 5 % 50 mL IVPB  Status:  Discontinued     1 g 100 mL/hr over 30 Minutes Intravenous  Once 03/24/16 2227 03/24/16 2229   03/24/16 2230  cefTRIAXone (ROCEPHIN) 1 g in dextrose 5 % 50 mL IVPB     1 g 100 mL/hr over 30 Minutes Intravenous  Once 03/24/16 2229 03/25/16 0002        Objective:   Vitals:   03/25/16 0100 03/25/16 0205 03/25/16 0518 03/25/16 0756  BP: 141/62 137/71 110/62 120/71  Pulse: 70 69 72 74  Resp: 17 18 16 16   Temp:  97.5 F (36.4 C) 97.5 F (36.4 C) 97.9 F (36.6 C)  TempSrc:  Oral Oral Oral  SpO2: 96% 98% 96% 98%  Weight:  56.7 kg (125 lb)    Height:        Wt Readings from Last 3 Encounters:  03/25/16 56.7 kg (125 lb)  10/20/15 58.7 kg (129 lb 6.6 oz)  10/07/15 66.2 kg (145 lb 15.1 oz)     Intake/Output Summary (Last 24 hours) at 03/25/16 1311 Last data filed at 03/25/16 0930  Gross per 24 hour  Intake            652.5 ml  Output              175 ml  Net            477.5 ml     Physical Exam  Awake Alert,  Supple Neck,No JVD,   Symmetrical Chest wall movement, Good air movement bilaterally, CTAB RRR,No Gallops,Rubs or new Murmurs, No Parasternal Heave +ve B.Sounds, Abd Soft, No tenderness,  No Cyanosis, Clubbing or edema, No new Rash or bruise      Data Review:    CBC  Recent Labs Lab 03/24/16 2145 03/25/16 0405  WBC 11.4* 7.4  HGB 13.3 11.0*  HCT 41.1 34.9*  PLT 196 167  MCV 93.4 93.8  MCH 30.2 29.6  MCHC 32.4 31.5  RDW 13.8 13.9  LYMPHSABS 0.9  --   MONOABS 0.8  --   EOSABS  0.1  --   BASOSABS 0.0  --     Chemistries   Recent Labs Lab 03/24/16 2145 03/25/16 0405  NA 139 142  K 3.9 3.6  CL 103 108  CO2 29 25  GLUCOSE 141* 153*  BUN 25* 21*  CREATININE 1.26* 1.04  CALCIUM 9.7 8.8*  AST 17  --   ALT 15*  --   ALKPHOS 80  --   BILITOT 0.6  --    ------------------------------------------------------------------------------------------------------------------ No results for input(s): CHOL, HDL, LDLCALC, TRIG, CHOLHDL, LDLDIRECT in the last 72 hours.  Lab Results  Component Value Date   HGBA1C 8.1 (H) 10/05/2015   ------------------------------------------------------------------------------------------------------------------ No results for input(s): TSH, T4TOTAL, T3FREE, THYROIDAB in the last 72 hours.  Invalid input(s): FREET3 ------------------------------------------------------------------------------------------------------------------  Recent Labs  03/25/16 1058  VITAMINB12 957*    Coagulation profile  Recent Labs Lab 03/24/16 2145  INR 1.02    No results for input(s): DDIMER in the last 72 hours.  Cardiac Enzymes No results for input(s): CKMB, TROPONINI, MYOGLOBIN in the last 168 hours.  Invalid input(s): CK ------------------------------------------------------------------------------------------------------------------ No results found for: BNP  Inpatient  Medications  Scheduled Meds: . atorvastatin  20 mg Oral q1800  . azithromycin  500 mg Intravenous QHS  . clopidogrel  75 mg Oral Daily  . colchicine  0.6 mg Oral Daily  . docusate sodium  200 mg Oral QHS  . finasteride  5 mg Oral Q1200  . fluticasone  1 spray Each Nare Daily  . insulin aspart  0-9 Units Subcutaneous TID WC  . lisinopril  20 mg Oral BID  . Melatonin  9 mg Oral QHS  . sodium chloride flush  3 mL Intravenous Q12H   Continuous Infusions:  PRN Meds:.acetaminophen **OR** acetaminophen, ondansetron **OR** ondansetron (ZOFRAN) IV  Micro  Results No results found for this or any previous visit (from the past 240 hour(s)).  Radiology Reports Ct Head Wo Contrast  Result Date: 03/24/2016 CLINICAL DATA:  Increased confusion with disorientation.  Dementia. EXAM: CT HEAD WITHOUT CONTRAST TECHNIQUE: Contiguous axial images were obtained from the base of the skull through the vertex without intravenous contrast. COMPARISON:  10/05/2015 FINDINGS: Brain: No evidence of acute infarction, hemorrhage, hydrocephalus, extra-axial collection or mass lesion/mass effect. Advanced chronic microvascular disease with diffuse ischemic gliosis in the cerebral white matter and pons. Remote perforator infarct affecting the right internal capsule and corona radiata. Generalized atrophy Vascular: Diffuse atherosclerotic calcification. No hyperdense vessel. Skull: Negative Sinuses/Orbits: Bilateral cataract resection. Senescent globe calcifications. No acute finding. Other: None IMPRESSION: 1. No acute finding. 2. Atrophy and extensive chronic microvascular disease. Electronically Signed   By: Marnee SpringJonathon  Watts M.D.   On: 03/24/2016 22:19   Dg Chest Portable 1 View  Result Date: 03/24/2016 CLINICAL DATA:  Worsening speech today. EXAM: PORTABLE CHEST 1 VIEW COMPARISON:  October 04, 2015 FINDINGS: There is a prominent skin fold over the lateral left chest. There are lung markings on both sides and there is no convincing evidence of pneumothorax on today's study. The heart, hila, and mediastinum are stable. Increased opacities are seen centrally in the infrahilar regions, right greater than left. No other interval changes or acute abnormalities. IMPRESSION: Increased opacity in the perihilar regions may represent developing infiltrate. The appearance is more coarsened than typical for edema. No other acute abnormalities. Recommend follow-up to resolution. Electronically Signed   By: Gerome Samavid  Williams III M.D   On: 03/24/2016 21:43     Randol KernELGERGAWY, Moo Gravley M.D on 03/25/2016  at 1:11 PM  Between 7am to 7pm - Pager - [phone]  After 7pm go to [email or web address] - password [email or web address]  [facility] -  Office  [phone]

## 2016-03-25 NOTE — Evaluation (Signed)
Physical Therapy Evaluation Patient Details Name: Roger BurtonLloyd Reichart MRN: [id number] DOB: [birth year] Today's Date: 03/25/2016   History of Present Illness  82 yo male with CVA from earlier in the year, now with UTI and leukocytosis and PMHx:  Alz, DM, gout, emotional lability, chronic brain changes, expressive aphasia,   Clinical Impression  Pt is up to chair with PT assisting him with dense help, very much a two person task.  Instruction given to CNA for 2 people to use drop arm of chair and slide pt back to bed so lifting injury does not occur.  Family in to observe and discussed his needs for therapy being for more intensive rehab now as he is so much weaker than prior to his hospitalization.  Follow acutely for transfers and strengthening as tolerated.    Follow Up Recommendations SNF    Equipment Recommendations  None recommended by PT    Recommendations for Other Services OT consult;Rehab consult     Precautions / Restrictions Precautions Precautions: Fall (telemetry) Restrictions Weight Bearing Restrictions: No      Mobility  Bed Mobility Overal bed mobility: Needs Assistance Bed Mobility: Supine to Sit     Supine to sit: Min assist;Mod assist;HOB elevated (using bed pad for support)     General bed mobility comments: pt is able to assist with RUE to scoot up and needs assist under trunk to sit up  Transfers Overall transfer level: Needs assistance Equipment used: 1 person hand held assist Transfers: Sit to/from Starwood HotelsStand;Squat Pivot Transfers Sit to Stand: Max assist;From elevated surface (partial stand)   Squat pivot transfers: From elevated surface;Max assist        Ambulation/Gait             General Gait Details: unable to take steps  Stairs            Wheelchair Mobility    Modified Rankin (Stroke Patients Only) Modified Rankin (Stroke Patients Only) Pre-Morbid Rankin Score: Moderate disability Modified Rankin: Severe disability     Balance  Overall balance assessment: Needs assistance Sitting-balance support: Feet supported Sitting balance-Leahy Scale: Fair     Standing balance support: Bilateral upper extremity supported Standing balance-Leahy Scale: Zero                               Pertinent Vitals/Pain Pain Assessment: Faces Faces Pain Scale: Hurts even more Pain Location: L arm with touching to move it Pain Intervention(s): Limited activity within patient's tolerance;Repositioned;Monitored during session    Home Living Family/patient expects to be discharged to:: Skilled nursing facility Living Arrangements: Spouse/significant other Available Help at Discharge: Family Type of Home: House                Prior Function Level of Independence: Needs assistance   Gait / Transfers Assistance Needed: daughter assisted with hemiwalker (wife struggles with transfers per her report)  ADL's / Homemaking Assistance Needed: assisted with family support and wife reports a struggle to assist transfers        Hand Dominance   Dominant Hand: Right    Extremity/Trunk Assessment   Upper Extremity Assessment: LUE deficits/detail       LUE Deficits / Details: L arm in flexed posture in his side   Lower Extremity Assessment: Generalized weakness;LLE deficits/detail   LLE Deficits / Details: L leg is very weak, assisted to move at all  Cervical / Trunk Assessment: Kyphotic  Communication   Communication: Expressive  difficulties  Cognition Arousal/Alertness: Awake/alert Behavior During Therapy: Flat affect Overall Cognitive Status: History of cognitive impairments - at baseline       Memory: Decreased short-term memory              General Comments General comments (skin integrity, edema, etc.): Pt is demonstrating poor midline control with strong lean to R side, and resistant to supporting himself with L elbow on pillow to reshift posture.      Exercises General Exercises - Lower  Extremity Ankle Circles/Pumps: AAROM;Both;10 reps Long Arc Quad: PROM;AAROM;Left;10 reps Heel Slides: AROM;10 reps;Left Hip Flexion/Marching: AAROM;PROM;Left;10 reps      Assessment/Plan    PT Assessment Patient needs continued PT services  PT Diagnosis Generalized weakness;Hemiplegia non-dominant side   PT Problem List Decreased strength;Decreased range of motion;Decreased activity tolerance;Decreased balance;Decreased mobility;Decreased coordination;Decreased cognition;Decreased knowledge of use of DME;Decreased safety awareness;Decreased knowledge of precautions;Cardiopulmonary status limiting activity  PT Treatment Interventions     PT Goals (Current goals can be found in the Care Plan section) Acute Rehab PT Goals Patient Stated Goal: none stated PT Goal Formulation: With family Time For Goal Achievement: 04/08/16 Potential to Achieve Goals: Good    Frequency Min 2X/week   Barriers to discharge Decreased caregiver support (wife reports that she cannot assist him) family is struggling to assist him    Co-evaluation               End of Session Equipment Utilized During Treatment: Oxygen Activity Tolerance: Patient tolerated treatment well;Patient limited by fatigue;Other (comment) (L side increased weakness and lack of midline awarenress) Patient left: in chair;with call bell/phone within reach;with chair alarm set;with family/visitor present Nurse Communication: Mobility status    Functional Assessment Tool Used: clinical judgment Functional Limitation: Mobility: Walking and moving around Mobility: Walking and Moving Around Current Status (Z3086): At least 60 percent but less than 80 percent impaired, limited or restricted Mobility: Walking and Moving Around Goal Status [phone]): At least 20 percent but less than 40 percent impaired, limited or restricted    Time: 1128-1156 PT Time Calculation (min) (ACUTE ONLY): 28 min   Charges:   PT Evaluation $PT Eval  Moderate Complexity: 1 Procedure PT Treatments $Therapeutic Activity: 8-22 mins   PT G Codes:   PT G-Codes **NOT FOR INPATIENT CLASS** Functional Assessment Tool Used: clinical judgment Functional Limitation: Mobility: Walking and moving around Mobility: Walking and Moving Around Current Status (N6295): At least 60 percent but less than 80 percent impaired, limited or restricted Mobility: Walking and Moving Around Goal Status [phone]): At least 20 percent but less than 40 percent impaired, limited or restricted    Ivar Drape 03/25/2016, 1:31 PM    Samul Dada, PT MS Acute Rehab Dept. Number: [facility] [id number] and [facility] [phone]

## 2016-03-25 NOTE — Evaluation (Signed)
Speech Language Pathology Evaluation Patient Details Name: [name] MRN: [id number] DOB: August 16, 1930 Today's Date: 03/25/2016 Time: 7903-8333 SLP Time Calculation (min) (ACUTE ONLY): 22 min  Problem List:  Patient Active Problem List   Diagnosis Date Noted  . UTI (lower urinary tract infection) 03/24/2016  . Fever 03/24/2016  . Leukocytosis 03/24/2016  . Acute confusional state   . Dry mouth   . Insomnia   . Muscle spasticity   . AKI (acute kidney injury) (HCC)   . Benign essential HTN   . Dementia   . DM type 2 with diabetic peripheral neuropathy (HCC)   . Hemiplegia affecting non-dominant side, post-stroke (HCC)   . Dysphagia as late effect of cerebrovascular disease   . Basal ganglia infarction (HCC) 10/07/2015  . Acute left hemiparesis (HCC)   . Gait disturbance, post-stroke   . HLD (hyperlipidemia)   . Slurred speech 10/04/2015  . Malignant hypertension 10/04/2015  . Diabetes (HCC) 10/04/2015  . Stroke (HCC) 10/04/2015  . Left-sided weakness   . Stroke-like symptoms   . Physical deconditioning   . Diabetes mellitus with complication (HCC)   . Alzheimer's disease 08/14/2013   Past Medical History:  Past Medical History:  Diagnosis Date  . Alzheimer disease   . Benign prostate hyperplasia   . Diabetes (HCC)   . Dysphagia   . ED (erectile dysfunction)   . Gout   . Heart murmur   . 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 Hyperlipidemia   . Hypertension   . Kidney stones    Past Surgical History:  Past Surgical History:  Procedure Laterality Date  . APPENDECTOMY    . LITHOTRIPSY    . TONSILLECTOMY    . UVULOPALATOPHARYNGOPLASTY     for snoring   HPI:   Roger [name] is a 82 y.o. gentleman with a history of CVA which left him with an expressive aphasia and residual left sided weakness, CKD, HTN, HLD, and DM who presents to the ED accompanied by his wife and daughter.  Family reports that he had  gross hematuria on July 31st, which prompted presentation to his PCP.  He was not diagnosed with a UTI or put on antibiotics.  However, he was referred to Urology, and has an outpatient appointment next week.  However, last night he seemed to develop worsening weakness and increased slurred speech (different from his baseline since his stroke).  His wife has also noted urinary frequency, but the patient has not indicated dysuria.  He did not sleep well last night.  His wife reports temperature of 99 at home but no sweats of chills.  The patient has not indicated abdominal pain.  No nausea or vomiting.  The rising temperature prompted presentation to the ED today.     Assessment / Plan / Recommendation Clinical Impression  Pt has recent history of stroke (09/2015). Daughter was at bedside for evaluation and reports his speech was slurred yesterday and he was less responsive verbally. Imaging showed no acute process and his speech has cleared today. It is felt his change in mental status and speech were related to a UTI. His speech and language have returned to baseline and no speech/language therapy are recommended at this time.     SLP Assessment       Follow Up Recommendations  None    Frequency and Duration min 1 x/week         SLP Evaluation Prior Functioning  Cognitive/Linguistic Baseline: Within  functional limits Type of Home: House  Lives With: Spouse Available Help at Discharge: Family Vocation: Retired   IT consultantCognition  Overall Cognitive Status: Within Functional Limits for tasks assessed Arousal/Alertness: Awake/alert Orientation Level: Oriented X4 Memory: Appears intact Awareness: Appears intact Problem Solving: Appears intact Safety/Judgment: Appears intact    Comprehension  Auditory Comprehension Overall Auditory Comprehension: Appears within functional limits for tasks assessed Yes/No Questions: Within Functional Limits Commands: Within Functional Limits Conversation:  Simple EffectiveTechniques: Extra processing time Visual Recognition/Discrimination Discrimination: Within Function Limits Reading Comprehension Reading Status: Within funtional limits    Expression Expression Primary Mode of Expression: Verbal Verbal Expression Overall Verbal Expression: Appears within functional limits for tasks assessed Initiation: No impairment Repetition: No impairment Naming: No impairment Pragmatics: No impairment Written Expression Dominant Hand: Right   Oral / Motor  Oral Motor/Sensory Function Overall Oral Motor/Sensory Function: Within functional limits Motor Speech Overall Motor Speech: Appears within functional limits for tasks assessed Respiration: Within functional limits Phonation: Normal Resonance: Within functional limits Articulation: Within functional limitis Intelligibility: Intelligible Motor Planning: Witnin functional limits   GO          Functional Limitations: Swallowing Swallow Current Status (Z6109(G8996): At least 1 percent but less than 20 percent impaired, limited or restricted Swallow Goal Status (U0454(G8997): 0 percent impaired, limited or restricted        .  [name], MA, CCC-SLP 03/25/2016 11:32 AM

## 2016-03-26 DIAGNOSIS — N390 Urinary tract infection, site not specified: Secondary | ICD-10-CM | POA: Diagnosis not present

## 2016-03-26 LAB — HEMOGLOBIN A1C
HEMOGLOBIN A1C: 5.7 % — AB (ref 4.8–5.6)
Mean Plasma Glucose: 117 mg/dL

## 2016-03-26 LAB — GLUCOSE, CAPILLARY
GLUCOSE-CAPILLARY: 60 mg/dL — AB (ref 65–99)
GLUCOSE-CAPILLARY: 135 mg/dL — AB (ref 65–99)
GLUCOSE-CAPILLARY: 193 mg/dL — AB (ref 65–99)
GLUCOSE-CAPILLARY: 149 mg/dL — AB (ref 65–99)
Glucose-Capillary: 208 mg/dL — ABNORMAL HIGH (ref 65–99)

## 2016-03-26 MED ORDER — AZITHROMYCIN 500 MG PO TABS
500.0000 mg | ORAL_TABLET | Freq: Every day | ORAL | Status: DC
Start: 2016-03-26 — End: 2016-03-27
  Administered 2016-03-26 – 2016-03-27 (×2): 500 mg via ORAL
  Filled 2016-03-26 (×2): qty 1

## 2016-03-26 MED ORDER — ENSURE ENLIVE PO LIQD
237.0000 mL | Freq: Three times a day (TID) | ORAL | Status: DC
  Administered 2016-03-26 – 2016-03-27 (×3): 237 mL via ORAL

## 2016-03-26 MED ORDER — DEXTROSE 5 % IV SOLN
1.0000 g | INTRAVENOUS | Status: DC
  Administered 2016-03-26 – 2016-03-27 (×2): 1 g via INTRAVENOUS
  Filled 2016-03-26 (×2): qty 10

## 2016-03-26 NOTE — Evaluation (Signed)
Occupational Therapy Evaluation Patient Details Name: Roger [name] MRN: [id number] DOB: Oct 05, 1930 Today's Date: 03/26/2016    History of Present Illness 82 yo male with CVA from earlier in the year, now with UTI and leukocytosis and PMHx:  Alz, DM, gout, emotional lability, chronic brain changes, expressive aphasia,    Clinical Impression   PTA, pt required max-total assist with all ADLs, IADLs, and basic transfers however pt was able to eat by himself with setup assist provided. Pt currently presents with generalized weakness, L hemiparesis (UE >LE), cognitive deficits, and balance deficits. Pt required max assist for bed mobility and then requested time to finish breakfast before mobilizing with therapist. Pt's daughter would like pt to be discharged to her house after either hospital stay or subsequent SNF stay. Pt will benefit from continued acute OT to increase independence and safety with ADLs and mobility to allow for safe discharge to the next venue of care. Recommend SNF for post-acute rehab to maximize pt's capabilities and to decrease caregiver burden.     Follow Up Recommendations  SNF;Supervision/Assistance - 24 hour    Equipment Recommendations  Other (comment) (hoyer lift when pt returns to house with wife)    Recommendations for Other Services       Precautions / Restrictions Precautions Precautions: Fall Precaution Comments: L hemipareisis (UE>LE) Required Braces or Orthoses: Other Brace/Splint Other Brace/Splint: has nighttimes resting hand splint, but not in hospital Restrictions Weight Bearing Restrictions: No      Mobility Bed Mobility Overal bed mobility: Needs Assistance Bed Mobility: Rolling Rolling: Max assist   Supine to sit: Max assist     General bed mobility comments: Max assist to achieve full side-lying position. On examination, pt noted to have large red spot on sacral region - RN notified and placed sacral pad with therapist assisting.    Transfers                 General transfer comment: Not attempted at this time as pt requested time to finish breakfast.    Balance                                            ADL Overall ADL's : Needs assistance/impaired Eating/Feeding: Set up;Sitting   Grooming: Wash/dry hands;Set up;Sitting   Upper Body Bathing: Maximal assistance;With caregiver independent assisting;Bed level   Lower Body Bathing: Maximal assistance;+2 for physical assistance;Bed level;With caregiver independent assisting   Upper Body Dressing : Maximal assistance;Bed level   Lower Body Dressing: Maximal assistance;+2 for physical assistance;Bed level                 General ADL Comments: Daughter assisted with all ADLs. Afterwards, pt requested time to finish breakfast.     Vision Vision Assessment?: No apparent visual deficits   Perception     Praxis      Pertinent Vitals/Pain Pain Assessment: No/denies pain     Hand Dominance Right   Extremity/Trunk Assessment Upper Extremity Assessment Upper Extremity Assessment: LUE deficits/detail;Generalized weakness LUE Deficits / Details: Increased tone, trace movement in fingers/hand, able to achieve neutral wrist position with increased time, trace bicep strength and slightly increased tricep strength, no shoulder AROM or strength LUE Coordination: decreased gross motor;decreased fine motor   Lower Extremity Assessment Lower Extremity Assessment: LLE deficits/detail;Generalized weakness LLE Deficits / Details: Increased tone, 2/5 hip and knee extensors strength, 2-/5 hip and  knee flexors, trace ankle and toe movement LLE Coordination: decreased fine motor;decreased gross motor   Cervical / Trunk Assessment Cervical / Trunk Assessment: Kyphotic   Communication Communication Communication: Expressive difficulties (speaks softly)   Cognition Arousal/Alertness: Awake/alert Behavior During Therapy: WFL for tasks  assessed/performed Overall Cognitive Status: History of cognitive impairments - at baseline                     General Comments       Exercises       Shoulder Instructions      Home Living Family/patient expects to be discharged to:: Private residence Living Arrangements: Children (daughter) Available Help at Discharge: Family;Available 24 hours/day Type of Home: House Home Access: Stairs to enter Entergy CorporationEntrance Stairs-Number of Steps: 2 Entrance Stairs-Rails: None Home Layout: One level     Bathroom Shower/Tub: Chief Strategy OfficerTub/shower unit   Bathroom Toilet: Standard     Home Equipment: Wheelchair - manual;Grab bars - tub/shower;Grab bars - toilet;Hand held shower head;Tub bench;Shower seat;Bedside commode;Walker - 2 wheels;Walker - 4 wheels;Cane - single point   Additional Comments: Information above reflects pt/wife's residence. Pt's daughter would like for pt to come to her house at discharge.   Lives With: Spouse    Prior Functioning/Environment Level of Independence: Needs assistance  Gait / Transfers Assistance Needed: daughter assisted with hemiwalker (wife struggles with transfers per pt report) ADL's / Homemaking Assistance Needed: max-total assist for ADLs, can eat by himself with setup        OT Diagnosis: Generalized weakness;Cognitive deficits;Hemiplegia non-dominant side   OT Problem List: Decreased strength;Decreased range of motion;Decreased activity tolerance;Impaired balance (sitting and/or standing);Decreased coordination;Decreased cognition;Impaired tone;Impaired UE functional use   OT Treatment/Interventions: Self-care/ADL training;Therapeutic exercise;Neuromuscular education;DME and/or AE instruction;Therapeutic activities;Patient/family education;Balance training    OT Goals(Current goals can be found in the care plan section) Acute Rehab OT Goals Patient Stated Goal: to get stronger and do more therapy OT Goal Formulation: With patient Time For Goal  Achievement: 04/09/16 Potential to Achieve Goals: Fair ADL Goals Pt Will Perform Grooming: with set-up;sitting Pt Will Perform Upper Body Bathing: with min assist;with caregiver independent in assisting;sitting Pt Will Perform Lower Body Bathing: with max assist;with caregiver independent in assisting;sit to/from stand Pt Will Transfer to Toilet: with mod assist;with +2 assist;stand pivot transfer;bedside commode Pt Will Perform Toileting - Clothing Manipulation and hygiene: with mod assist;with 2+ total assist;sit to/from stand Pt/caregiver will Perform Home Exercise Program: Increased ROM;Both right and left upper extremity;With minimal assist;With written HEP provided;Increased strength  OT Frequency: Min 3X/week   Barriers to D/C: Decreased caregiver support  Pt's daughter feels that pt's wife cannot provide necessary level of physical assistance. Pt/wife have had some falls while attempting transfers.       Co-evaluation              End of Session Nurse Communication: Mobility status;Other (comment) (Pt with large red are on skin of sacral region)  Activity Tolerance: Patient tolerated treatment well Patient left: in bed;with call bell/phone within reach;with bed alarm set;with family/visitor present   Time: [id number] OT Time Calculation (min): 9 min Charges:  OT General Charges $OT Visit: 1 Procedure OT Evaluation $OT Eval High Complexity: 1 Procedure G-Codes:    Nils PyleJulia Marco Adelson, OTR/L Pager: [phone]: [phone] 03/26/2016, 2:14 PM

## 2016-03-26 NOTE — Progress Notes (Signed)
Occupational Therapy Treatment Patient Details Name: Roger [name] MRN: [id number] DOB: 02-19-1931 Today's Date: 03/26/2016    History of present illness 82 yo male with CVA from earlier in the year, now with UTI and leukocytosis and PMHx:  Alz, DM, gout, emotional lability, chronic brain changes, expressive aphasia,    OT comments  Returned for second session as pt's daughter requested for assistance to help pt sit up in chair. Began session with AROM for all joints in RUE and RLE and provided PROM with some AAROM stretching for LUE and LLE. Pt required max +2 assist for sit-stand and stand-pivot transfer to the chair and pt's daughter independent in assisting therapist with transfer as she typically completes these with pt at home. Current POC and discharge disposition remains appropriate. Will continue to follow acutely to address OT needs and goals.   Follow Up Recommendations  SNF;Supervision/Assistance - 24 hour    Equipment Recommendations  Other (comment) (hoyer lift)    Recommendations for Other Services      Precautions / Restrictions Precautions Precautions: Fall Precaution Comments: L hemipareisis (UE>LE) Required Braces or Orthoses: Other Brace/Splint Other Brace/Splint: has nighttime resting hand splint, but not in hospital Restrictions Weight Bearing Restrictions: No       Mobility Bed Mobility Overal bed mobility: Needs Assistance Bed Mobility: Rolling;Sidelying to Sit Rolling: Max assist Sidelying to sit: Max assist Supine to sit: Max assist     General bed mobility comments: Max assist to move BLE off bed, to pivot and scoot hips to EOB with bed pad, and for trunk support to come to sitting position. Pt able to use RUE on bedrail to assist in pulling himself up to full sitting position. Pt able to sit EOB with RUE supporting himself with min assist initially and progressed to min guard assist.  Transfers Overall transfer level: Needs assistance Equipment  used: 2 person hand held assist Transfers: Sit to/from [employer] Transfers Sit to Stand: Max assist;+2 physical assistance Stand pivot transfers: Max assist;+2 physical assistance       General transfer comment: Max +2 assist with pt's daughter providing assistance with therapist as she typically completes these transfers with pt at home. Therapist blocked L knee during transfer to facilitate functinoal BoS and for pivotal steps to chair.    Balance Overall balance assessment: Needs assistance Sitting-balance support: Single extremity supported;Feet supported Sitting balance-Leahy Scale: Fair Sitting balance - Comments: Able to progress to min guard assist sitting EOB with RUE supported   Standing balance support: Single extremity supported;During functional activity Standing balance-Leahy Scale: Zero Standing balance comment: Requires max +2 assist to maintain safe upright position                   ADL Overall ADL's : Needs assistance/impaired Eating/Feeding: Set up;Sitting   Grooming: Wash/dry hands;Set up;Sitting   Upper Body Bathing: Maximal assistance;With caregiver independent assisting;Bed level   Lower Body Bathing: Maximal assistance;+2 for physical assistance;Bed level;With caregiver independent assisting   Upper Body Dressing : Maximal assistance;Bed level   Lower Body Dressing: Maximal assistance;+2 for physical assistance;Bed level   Toilet Transfer: Maximal assistance;+2 for physical assistance;Cueing for safety;With caregiver independent assisting Toilet Transfer Details (indicate cue type and reason): simulated from bed-chair with daughter assisting therapist Toileting- Clothing Manipulation and Hygiene: Total assistance;+2 for physical assistance;Sit to/from stand       Functional mobility during ADLs: Maximal assistance;+2 for physical assistance General ADL Comments: Daughter assisted with all ADLs. Afterwards, pt requested time to finish  breakfast.      Vision                     Perception     Praxis      Cognition   Behavior During Therapy: WFL for tasks assessed/performed Overall Cognitive Status: History of cognitive impairments - at baseline       Memory: Decreased short-term memory               Extremity/Trunk Assessment  Upper Extremity Assessment Upper Extremity Assessment: LUE deficits/detail;Generalized weakness LUE Deficits / Details: Increased tone, trace movement in fingers/hand, able to achieve neutral wrist position with increased time, trace bicep strength and slightly increased tricep strength, no shoulder AROM or strength LUE Coordination: decreased gross motor;decreased fine motor   Lower Extremity Assessment Lower Extremity Assessment: LLE deficits/detail;Generalized weakness LLE Deficits / Details: Increased tone, 2/5 hip and knee extensors strength, 2-/5 hip and knee flexors, trace ankle and toe movement LLE Coordination: decreased fine motor;decreased gross motor   Cervical / Trunk Assessment Cervical / Trunk Assessment: Kyphotic    Exercises General Exercises - Upper Extremity Shoulder Flexion: AAROM;Right;10 reps;Supine Shoulder ABduction: AAROM;Right;10 reps;Supine Elbow Flexion: AROM;Right;10 reps;Supine Elbow Extension: AROM;Right;10 reps;Supine Wrist Flexion: AROM;Right;10 reps;Supine Wrist Extension: AROM;Right;10 reps;Supine Digit Composite Flexion: AROM;Right;10 reps;Supine Composite Extension: AROM;Right;10 reps;Supine General Exercises - Lower Extremity Ankle Circles/Pumps: AROM;Right;10 reps;Supine Heel Slides: AROM;Right;10 reps;Supine Hip Flexion/Marching: AROM;Right;10 reps;Supine Toe Raises: AROM;Right;10 reps;Supine Other Exercises Other Exercises: AAROM/PROM for L toes, ankle dorsi and plantar flexion, knee flexion/extension, hip flexion/extension Other Exercises: AAROM/PROM for flexion/extension of fingers, wrist, elbow, and shoulder    Shoulder Instructions       General Comments      Pertinent Vitals/ Pain       Pain Assessment: No/denies pain  Home Living Family/patient expects to be discharged to:: Private residence Living Arrangements: Children (daughter) Available Help at Discharge: Family;Available 24 hours/day Type of Home: House Home Access: Stairs to enter Enter[employer] CorporationEntrance Stairs-Number of Steps: 2 Entrance Stairs-Rails: None Home Layout: One level     Bathroom Shower/Tub: Chief Strategy OfficerTub/shower unit   Bathroom Toilet: Standard     Home Equipment: Wheelchair - manual;Grab bars - tub/shower;Grab bars - toilet;Hand held shower head;Tub bench;Shower seat;Bedside commode;Walker - 2 wheels;Walker - 4 wheels;Cane - single point   Additional Comments: Information above reflects pt/wife's residence. Pt's daughter would like for pt to come to her house at discharge.   Lives With: Spouse    Prior Functioning/Environment Level of Independence: Needs assistance  Gait / Transfers Assistance Needed: daughter assisted with hemiwalker (wife struggles with transfers per pt report) ADL's / Homemaking Assistance Needed: max-total assist for ADLs, can eat by himself with setup       Frequency Min 3X/week     Progress Toward Goals  OT Goals(current goals can now be found in the care plan section)  Progress towards OT goals: Progressing toward goals  Acute Rehab OT Goals Patient Stated Goal: to get stronger and do more therapy OT Goal Formulation: With patient Time For Goal Achievement: 04/09/16 Potential to Achieve Goals: Fair ADL Goals Pt Will Perform Grooming: with set-up;sitting Pt Will Perform Upper Body Bathing: with min assist;with caregiver independent in assisting;sitting Pt Will Perform Lower Body Bathing: with max assist;with caregiver independent in assisting;sit to/from stand Pt Will Transfer to Toilet: with mod assist;with +2 assist;stand pivot transfer;bedside commode Pt Will Perform Toileting - Clothing  Manipulation and hygiene: with mod assist;with 2+ total assist;sit to/from stand Pt/caregiver will Perform Home Exercise  Program: Increased ROM;Both right and left upper extremity;With minimal assist;With written HEP provided;Increased strength  Plan Discharge plan remains appropriate    Co-evaluation                 End of Session Equipment Utilized During Treatment: Gait belt   Activity Tolerance Patient tolerated treatment well   Patient Left in chair;with call bell/phone within reach;with family/visitor present   Nurse Communication Mobility status        Time: 0940-1020 OT Time Calculation (min): 40 min  Charges: OT General Charges $OT Visit: 1 Procedure OT Evaluation $OT Eval High Complexity: 1 Procedure OT Treatments $Self Care/Home Management : 8-22 mins $Therapeutic Exercise: 23-37 mins  Nils Pyle, OTR/L Pager: [phone] 03/26/2016, 2:28 PM

## 2016-03-26 NOTE — NC FL2 (Signed)
[place] MEDICAID FL2 LEVEL OF CARE SCREENING TOOL     IDENTIFICATION  Patient Name: Roger BurtonLloyd Nobbe Birthdate: May 27, 1931 Sex: male Admission Date (Current Location): 03/24/2016  [facility] [place] [place]   Facility and Address:  [facility] [place], [address], [place], [place] [address]      Provider Number: [id number]  Attending Physician Name and Address:  Starleen Armsawood S Elgergawy, MD  Relative Name and Phone Number:  Corky Mullngela Stanley-daughter    Current Level of Care: Hospital Recommended Level of Care: Skilled Nursing Facility Prior Approval Number:    Date Approved/Denied:   PASRR Number: [id number]  Discharge Plan: SNF    Current Diagnoses: Patient Active Problem List   Diagnosis Date Noted  . UTI (lower urinary tract infection) 03/24/2016  . Fever 03/24/2016  . Leukocytosis 03/24/2016  . Acute confusional state   . Dry mouth   . Insomnia   . Muscle spasticity   . AKI (acute kidney injury) (HCC)   . Benign essential HTN   . Dementia   . DM type 2 with diabetic peripheral neuropathy (HCC)   . Hemiplegia affecting non-dominant side, post-stroke (HCC)   . Dysphagia as late effect of cerebrovascular disease   . Basal ganglia infarction (HCC) 10/07/2015  . Acute left hemiparesis (HCC)   . Gait disturbance, post-stroke   . HLD (hyperlipidemia)   . Slurred speech 10/04/2015  . Malignant hypertension 10/04/2015  . Diabetes (HCC) 10/04/2015  . Stroke (HCC) 10/04/2015  . Left-sided weakness   . Stroke-like symptoms   . Physical deconditioning   . Diabetes mellitus with complication (HCC)   . Alzheimer's disease 08/14/2013    Orientation RESPIRATION BLADDER Height & Weight     Self, Place  Normal Incontinent Weight: 57.6 kg (127 lb) Height:  6' (182.9 cm)  BEHAVIORAL SYMPTOMS/MOOD NEUROLOGICAL BOWEL NUTRITION STATUS      Continent Diet (cardiac, carb modified)  AMBULATORY STATUS COMMUNICATION OF NEEDS Skin   Extensive Assist  Verbally PU Stage and Appropriate Care PU Stage 1 Dressing:  (sacrum, foam dressing changes PRN)                     Personal Care Assistance Level of Assistance  Bathing, Dressing Bathing Assistance: Maximum assistance   Dressing Assistance: Maximum assistance     Functional Limitations Info             SPECIAL CARE FACTORS FREQUENCY  PT (By licensed PT), OT (By licensed OT)     PT Frequency: 5/wk OT Frequency: 5/wk            Contractures      Additional Factors Info  Code Status, Allergies Code Status Info: FULL Allergies Info: Aricept Donepezil Hcl, Namenda Memantine Hcl           Current Medications (03/26/2016):  This is the current hospital active medication list Current Facility-Administered Medications  Medication Dose Route Frequency Provider Last Rate Last Dose  . acetaminophen (TYLENOL) tablet 650 mg  650 mg Oral Q6H PRN Michael LitterNikki Carter, MD   650 mg at 03/26/16 1413   Or  . acetaminophen (TYLENOL) suppository 650 mg  650 mg Rectal Q6H PRN Michael LitterNikki Carter, MD      . atorvastatin (LIPITOR) tablet 20 mg  20 mg Oral q1800 Michael LitterNikki Carter, MD   20 mg at 03/25/16 2043  . azithromycin (ZITHROMAX) tablet 500 mg  500 mg Oral Daily Starleen Armsawood S Elgergawy, MD   500 mg at 03/26/16  1408  . cefTRIAXone (ROCEPHIN) 1 g in dextrose 5 % 50 mL IVPB  1 g Intravenous Q24H Starleen Arms, MD   1 g at 03/26/16 1239  . clopidogrel (PLAVIX) tablet 75 mg  75 mg Oral Daily Michael Litter, MD   75 mg at 03/26/16 1029  . colchicine tablet 0.6 mg  0.6 mg Oral Daily Michael Litter, MD   0.6 mg at 03/26/16 1029  . docusate sodium (COLACE) capsule 200 mg  200 mg Oral QHS Michael Litter, MD   200 mg at 03/25/16 2136  . feeding supplement (ENSURE ENLIVE) (ENSURE ENLIVE) liquid 237 mL  237 mL Oral TID BM Starleen Arms, MD   237 mL at 03/26/16 1408  . finasteride (PROSCAR) tablet 5 mg  5 mg Oral Q1200 Michael Litter, MD   5 mg at 03/26/16 1408  . fluticasone (FLONASE) 50 MCG/ACT nasal spray 1  spray  1 spray Each Nare Daily Michael Litter, MD   1 spray at 03/26/16 1030  . lisinopril (PRINIVIL,ZESTRIL) tablet 20 mg  20 mg Oral BID Michael Litter, MD   20 mg at 03/26/16 1029  . Melatonin TABS 9 mg  9 mg Oral QHS Michael Litter, MD   9 mg at 03/25/16 2136  . ondansetron (ZOFRAN) tablet 4 mg  4 mg Oral Q6H PRN Michael Litter, MD       Or  . ondansetron [facility]) injection 4 mg  4 mg Intravenous Q6H PRN Michael Litter, MD      . sodium chloride flush (NS) 0.9 % injection 3 mL  3 mL Intravenous Q12H Michael Litter, MD   3 mL at 03/26/16 1000     Discharge Medications: Please see discharge summary for a list of discharge medications.  Relevant Imaging Results:  Relevant Lab Results:   Additional Information SS#: [id number]  Burna Sis, LCSW

## 2016-03-26 NOTE — Progress Notes (Signed)
PROGRESS NOTE                                                                                                                                                                                                             Patient Demographics:    Roger [name], is a 82 y.o. male, DOB - May 02, 1931, ZOX:[id number]  Admit date - 03/24/2016   Admitting Physician Michael Litter, MD  Outpatient Primary MD for the patient is Pearla Dubonnet, MD  LOS - 0  Chief Complaint  Patient presents with  . Altered Mental Status       Brief Narrative   82 y.o. gentleman with a history of CVA which left him with an expressive aphasia and residual left sided weakness, CKD, HTN, HLD, and DM who presents to the ED With complaints of weakness, urinary frequency and lethargy, workup significant for UTI, and questionable early pneumonia   Subjective:    Roger [name] today has, No headache, No chest pain, No abdominal pain - No Nausea,No Cough - SOB.    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  Abnormal chest xray/CAP - Tinged with IV azithromycin and Rocephinfor empiric coverage for CAP - follow on Urine streptococcal antigen - follow on Pneumonia order set used  HTN --Continue home medications  DM - Hold metformin - Check A1c  - We'll hold insulin sliding scale during low CBGs  Recent CVA - Continue with Plavix - PT/OT/Speech evals  Hypertension - Continue with lisinopril    Code Status : Full  Family Communication  : Daughter at  bedside  Disposition Plan  : We'll need SNF placement  Consults  :  None  Procedures  : None  DVT Prophylaxis  :  Lovenox   Lab Results  Component Value Date   PLT 167 03/25/2016    Antibiotics  :    Anti-infectives  Start     Dose/Rate Route Frequency Ordered Stop   03/26/16 1400  azithromycin (ZITHROMAX) tablet 500 mg     500 mg Oral Daily 03/26/16 1321     03/26/16 1200  cefTRIAXone (ROCEPHIN) 1 g in dextrose 5 % 50 mL IVPB     1 g 100 mL/hr over 30 Minutes Intravenous Every 24 hours 03/26/16 1154     03/25/16 0245  azithromycin (ZITHROMAX) 500 mg in dextrose 5 % 250 mL IVPB  Status:  Discontinued     500 mg 250 mL/hr over 60 Minutes Intravenous Daily at bedtime 03/25/16 0206 03/26/16 1320   03/24/16 2230  cefTRIAXone (ROCEPHIN) 1 g in dextrose 5 % 50 mL IVPB  Status:  Discontinued     1 g 100 mL/hr over 30 Minutes Intravenous  Once 03/24/16 2227 03/24/16 2229   03/24/16 2230  cefTRIAXone (ROCEPHIN) 1 g in dextrose 5 % 50 mL IVPB     1 g 100 mL/hr over 30 Minutes Intravenous  Once 03/24/16 2229 03/25/16 0002        Objective:   Vitals:   03/25/16 1644 03/25/16 2031 03/26/16 0429 03/26/16 0809  BP: 130/67 117/68 139/72 (!) 151/60  Pulse: 66 66 70 70  Resp: 18 20 18 18   Temp: 97.6 F (36.4 C) 98 F (36.7 C) 98.4 F (36.9 C) 98.1 F (36.7 C)  TempSrc: Oral   Oral  SpO2: 98% 98% 99% 96%  Weight:  57.6 kg (127 lb)    Height:        Wt Readings from Last 3 Encounters:  03/25/16 57.6 kg (127 lb)  10/20/15 58.7 kg (129 lb 6.6 oz)  10/07/15 66.2 kg (145 lb 15.1 oz)     Intake/Output Summary (Last 24 hours) at 03/26/16 1438 Last data filed at 03/26/16 1140  Gross per 24 hour  Intake              690 ml  Output             1625 ml  Net             -935 ml     Physical Exam  Awake Alert,  Supple Neck,No JVD,   Symmetrical Chest wall movement, Good air movement bilaterally, CTAB RRR,No Gallops,Rubs or new Murmurs, No Parasternal Heave +ve B.Sounds,  Abd Soft, No tenderness,  No Cyanosis, Clubbing or edema, No new Rash or bruise      Data Review:    CBC  Recent Labs Lab 03/24/16 2145 03/25/16 0405  WBC 11.4* 7.4  HGB 13.3 11.0*  HCT 41.1 34.9*  PLT 196 167  MCV 93.4 93.8  MCH 30.2 29.6  MCHC 32.4 31.5  RDW 13.8 13.9  LYMPHSABS 0.9  --   MONOABS 0.8  --   EOSABS 0.1  --   BASOSABS 0.0  --     Chemistries   Recent Labs Lab 03/24/16 2145 03/25/16 0405  NA 139 142  K 3.9 3.6  CL 103 108  CO2 29 25  GLUCOSE 141* 153*  BUN 25* 21*  CREATININE 1.26* 1.04  CALCIUM 9.7 8.8*  AST 17  --   ALT 15*  --   ALKPHOS 80  --   BILITOT 0.6  --    ------------------------------------------------------------------------------------------------------------------ No results for input(s): CHOL, HDL, LDLCALC, TRIG, CHOLHDL, LDLDIRECT in the last 72 hours.  Lab Results  Component Value Date   HGBA1C 5.7 (H) 03/25/2016   ------------------------------------------------------------------------------------------------------------------ No results for input(s): TSH, T4TOTAL,  T3FREE, THYROIDAB in the last 72 hours.  Invalid input(s): FREET3 ------------------------------------------------------------------------------------------------------------------  Recent Labs  03/25/16 1058  VITAMINB12 957*    Coagulation profile  Recent Labs Lab 03/24/16 2145  INR 1.02    No results for input(s): DDIMER in the last 72 hours.  Cardiac Enzymes No results for input(s): CKMB, TROPONINI, MYOGLOBIN in the last 168 hours.  Invalid input(s): CK ------------------------------------------------------------------------------------------------------------------ No results found for: BNP  Inpatient Medications  Scheduled Meds: . atorvastatin  20 mg Oral q1800  . azithromycin  500 mg Oral Daily  . cefTRIAXone (ROCEPHIN)  IV  1 g Intravenous Q24H  . clopidogrel  75 mg Oral Daily  . colchicine  0.6 mg Oral Daily  . docusate  sodium  200 mg Oral QHS  . feeding supplement (ENSURE ENLIVE)  237 mL Oral TID BM  . finasteride  5 mg Oral Q1200  . fluticasone  1 spray Each Nare Daily  . lisinopril  20 mg Oral BID  . Melatonin  9 mg Oral QHS  . sodium chloride flush  3 mL Intravenous Q12H   Continuous Infusions:  PRN Meds:.acetaminophen **OR** acetaminophen, ondansetron **OR** ondansetron (ZOFRAN) IV  Micro Results Recent Results (from the past 240 hour(s))  Urine culture     Status: Abnormal (Preliminary result)   Collection Time: 03/24/16  9:33 PM  Result Value Ref Range Status   Specimen Description URINE, RANDOM  Final   Special Requests NONE  Final   Culture >=100,000 COLONIES/mL GRAM NEGATIVE RODS (A)  Final   Report Status PENDING  Incomplete  Culture, blood (Routine x 2)     Status: None (Preliminary result)   Collection Time: 03/24/16  9:45 PM  Result Value Ref Range Status   Specimen Description BLOOD RIGHT ANTECUBITAL  Final   Special Requests   Final    BOTTLES DRAWN AEROBIC AND ANAEROBIC BLUE 6CC RED 5CC   Culture NO GROWTH < 24 HOURS  Final   Report Status PENDING  Incomplete  Culture, blood (Routine x 2)     Status: None (Preliminary result)   Collection Time: 03/24/16  9:57 PM  Result Value Ref Range Status   Specimen Description BLOOD RIGHT FOREARM  Final   Special Requests BOTTLES DRAWN AEROBIC AND ANAEROBIC 5CC  Final   Culture NO GROWTH < 24 HOURS  Final   Report Status PENDING  Incomplete    Radiology Reports Ct Head Wo Contrast  Result Date: 03/24/2016 CLINICAL DATA:  Increased confusion with disorientation.  Dementia. EXAM: CT HEAD WITHOUT CONTRAST TECHNIQUE: Contiguous axial images were obtained from the base of the skull through the vertex without intravenous contrast. COMPARISON:  10/05/2015 FINDINGS: Brain: No evidence of acute infarction, hemorrhage, hydrocephalus, extra-axial collection or mass lesion/mass effect. Advanced chronic microvascular disease with diffuse ischemic  gliosis in the cerebral white matter and pons. Remote perforator infarct affecting the right internal capsule and corona radiata. Generalized atrophy Vascular: Diffuse atherosclerotic calcification. No hyperdense vessel. Skull: Negative Sinuses/Orbits: Bilateral cataract resection. Senescent globe calcifications. No acute finding. Other: None IMPRESSION: 1. No acute finding. 2. Atrophy and extensive chronic microvascular disease. Electronically Signed   By: Marnee Spring M.D.   On: 03/24/2016 22:19   Dg Chest Portable 1 View  Result Date: 03/24/2016 CLINICAL DATA:  Worsening speech today. EXAM: PORTABLE CHEST 1 VIEW COMPARISON:  October 04, 2015 FINDINGS: There is a prominent skin fold over the lateral left chest. There are lung markings on both sides and there is no convincing evidence of pneumothorax  on today's study. The heart, hila, and mediastinum are stable. Increased opacities are seen centrally in the infrahilar regions, right greater than left. No other interval changes or acute abnormalities. IMPRESSION: Increased opacity in the perihilar regions may represent developing infiltrate. The appearance is more coarsened than typical for edema. No other acute abnormalities. Recommend follow-up to resolution. Electronically Signed   By: Gerome Sam III M.D   On: 03/24/2016 21:43     Randol Kern, DAWOOD M.D on 03/26/2016 at 2:38 PM  Between 7am to 7pm - Pager - [phone]  After 7pm go to [email or web address] - password [facility]  [facility] -  Office  [phone]

## 2016-03-27 DIAGNOSIS — N390 Urinary tract infection, site not specified: Secondary | ICD-10-CM | POA: Diagnosis not present

## 2016-03-27 DIAGNOSIS — L8990 Pressure ulcer of unspecified site, unspecified stage: Secondary | ICD-10-CM | POA: Insufficient documentation

## 2016-03-27 LAB — GLUCOSE, CAPILLARY
GLUCOSE-CAPILLARY: 117 mg/dL — AB (ref 65–99)
GLUCOSE-CAPILLARY: 248 mg/dL — AB (ref 65–99)
GLUCOSE-CAPILLARY: 161 mg/dL — AB (ref 65–99)

## 2016-03-27 LAB — URINE CULTURE

## 2016-03-27 MED ORDER — LEVOFLOXACIN 500 MG PO TABS: 500.0000 mg | ORAL_TABLET | Freq: Every day | ORAL | 0 refills | Status: AC

## 2016-03-27 NOTE — Progress Notes (Signed)
Physical Therapy Treatment Patient Details Name: Roger [name] MRN: [id number] DOB: 1930/08/13 Today's Date: 03/27/2016    History of Present Illness 82 yo male with CVA from earlier in the year, now with UTI and leukocytosis and PMHx:  Alz, DM, gout, emotional lability, chronic brain changes, expressive aphasia,     PT Comments    Pt is scheduled to leave for SNF today, with greatly improved tolerance for being up and standing.  Focused session on increased L side awareness and WBing as much as pt can perform.  He and daughter are well aware of his needs for rehab, and will expect him to continue progressing standing toward gait at SNF.  Continue acutely if pt does not leave today as expected for wbing exercises to L side and standing/stepping.  Follow Up Recommendations  SNF     Equipment Recommendations  None recommended by PT    Recommendations for Other Services OT consult;Rehab consult     Precautions / Restrictions Precautions Precautions: Fall Precaution Comments: L hemipareisis (UE>LE) Required Braces or Orthoses: Other Brace/Splint Other Brace/Splint: has nighttime resting hand splint, but not in hospital Restrictions Weight Bearing Restrictions: No Other Position/Activity Restrictions: place LUE on pillows to support weak shoulder and limit tightness of L shoulder    Mobility  Bed Mobility               General bed mobility comments: Pt up in chair when PT arrives  Transfers Overall transfer level: Needs assistance Equipment used: 1 person hand held assist (gait belt) Transfers: Sit to/from Stand Sit to Stand: Mod assist;From elevated surface         General transfer comment: worked on static standing at chair with pt correcting his trunk and head, then trying to increase wgt through L LE esp knee  Ambulation/Gait             General Gait Details: unable to take steps   Stairs            Wheelchair Mobility    Modified Rankin (Stroke  Patients Only) Modified Rankin (Stroke Patients Only) Pre-Morbid Rankin Score: Moderate disability Modified Rankin: Severe disability     Balance Overall balance assessment: Needs assistance Sitting-balance support: Feet supported Sitting balance-Leahy Scale: Fair     Standing balance support:  (RUE and BLE's but R > L) Standing balance-Leahy Scale: Poor                      Cognition Arousal/Alertness: Awake/alert Behavior During Therapy: WFL for tasks assessed/performed Overall Cognitive Status: History of cognitive impairments - at baseline       Memory: Decreased short-term memory              Exercises General Exercises - Lower Extremity Long Arc Quad: AROM;AAROM;Both;10 reps Heel Slides: AAROM;Left;10 reps Hip ABduction/ADduction: AROM;Strengthening;AAROM;Both;10 reps Hip Flexion/Marching: AROM;AAROM;Both;10 reps    General Comments General comments (skin integrity, edema, etc.): Pt is more tolerant of mobility today, clearly recovering his strength and demonstrating good possibility for SNF care to progress nicely      Pertinent Vitals/Pain Pain Assessment: Faces Pain Score: 6  Faces Pain Scale: Hurts even more Pain Location: L arm with contact of any kind Pain Intervention(s): Limited activity within patient's tolerance;Monitored during session;Repositioned    Home Living                      Prior Function  PT Goals (current goals can now be found in the care plan section) Acute Rehab PT Goals Patient Stated Goal: to get stronger and do more therapy Progress towards PT goals: Progressing toward goals    Frequency  Min 2X/week    PT Plan Current plan remains appropriate    Co-evaluation             End of Session Equipment Utilized During Treatment: Oxygen;Gait belt Activity Tolerance: Patient tolerated treatment well;Other (comment) (improved his awareness of midline with standing and support ) Patient  left: in chair;with call bell/phone within reach;with chair alarm set;with family/visitor present;with nursing/sitter in room     Time: [phone] PT Time Calculation (min) (ACUTE ONLY): 29 min  Charges:  $Therapeutic Exercise: 8-22 mins $Therapeutic Activity: 8-22 mins                    G Codes:      Ivar Drape 04/22/16, 11:06 AM    Samul Dada, PT MS Acute Rehab Dept. Number: [facility] [id number] and [facility] [phone]

## 2016-03-27 NOTE — Progress Notes (Signed)
Patient Discharged to [facility]. Patient picked up by transport. IV x 2 removed. Telemetry removed. Patient left the floor in stable condition.

## 2016-03-27 NOTE — Progress Notes (Signed)
Speech Language Pathology Treatment: Dysphagia  Patient Details Name: Roger [name] MRN: [id number] DOB: [birth year] Today's Date: 03/27/2016 Time: [id number] SLP Time Calculation (min) (ACUTE ONLY): 18 min  Assessment / Plan / Recommendation Clinical Impression  F/u for dysphagia.  Wife and daughter present.  Pt for D/C today to [facility], where he resided for rehabilitation earlier this year s/p CVA. Pt presents with decreased toleration of thin liquids, with overt coughing, multiple sub-swallows with each bolus.  He has likely decompensated with this acute admission, and there are concerns for recurring dysphagia with heightened risk for asp pna.  Per daughter, pt had excellent speech/swallowing therapy services while at [facility] - recommend returning to nectar-thick liquids until reassessed at facility for safety.  Dtr verbalizes understanding.     HPI HPI:  Roger [name] is an 82 y.o. gentleman with a history of CVA which left him with an expressive aphasia and residual left sided weakness, CKD, HTN, HLD, and DM who presents to the ED accompanied by his wife and daughter.  Family reports that he had gross hematuria on July 31st, which prompted presentation to his PCP.  He was not diagnosed with a UTI or put on antibiotics.  However, he was referred to Urology, and has an outpatient appointment next week.  However, last night he seemed to develop worsening weakness and increased slurred speech (different from his baseline since his stroke).  His wife has also noted urinary frequency, but the patient has not indicated dysuria.  He did not sleep well last night.  His wife reports temperature of 99 at home but no sweats of chills.  The patient has not indicated abdominal pain.  No nausea or vomiting.  The rising temperature prompted presentation to the ED today.        SLP Plan     Pt D/Cing to SNF   Recommendations   dysphagia 3, nectars;                    GO           Functional Assessment Tool  Used: cllinical judgment Functional Limitations: Swallowing Swallow Current Status (M4158): At least 20 percent but less than 40 percent impaired, limited or restricted Swallow Goal Status (X0940): 0 percent impaired, limited or restricted Swallow Discharge Status [phone]): At least 20 percent but less than 40 percent impaired, limited or restricted    Blenda Mounts Laurice 03/27/2016, 11:16 AM

## 2016-03-27 NOTE — Clinical Social Work Placement (Addendum)
   CLINICAL SOCIAL WORK PLACEMENT  NOTE 01/26/16 - DISCHARGED TO [facility]  Date:  03/27/2016  Patient Details  Name: Roger [name] MRN: [id number] Date of Birth: October 01, 1930  Clinical Social Work is seeking post-discharge placement for this patient at the Skilled  Nursing Facility level of care (*CSW will initial, date and re-position this form in  chart as items are completed):  No   Patient/family provided with [facility] Clinical Social Work Department's list of facilities offering this level of care within the geographic area requested by the patient (or if unable, by the patient's family).  Yes   Patient/family informed of their freedom to choose among providers that offer the needed level of care, that participate in Medicare, Medicaid or managed care program needed by the patient, have an available bed and are willing to accept the patient.  No   Patient/family informed of [facility]'s ownership interest in [facility] and [facility], as well as of the fact that they are under no obligation to receive care at these facilities.  PASRR submitted to EDS on       PASRR number received on       Existing PASRR number confirmed on 03/26/16     FL2 transmitted to all facilities in geographic area requested by pt/family on 03/27/16     FL2 transmitted to all facilities within larger geographic area on       Patient informed that his/her managed care company has contracts with or will negotiate with certain facilities, including the following:        Yes   Patient/family informed of bed offers received.  Patient chooses bed at [facility] [place]     Physician recommends and patient chooses bed at      Patient to be transferred to [facility] [place] on 03/27/16.  Patient to be transferred to facility by Ambulance Sharin Mons)     Patient family notified on 03/27/16 of transfer.  Name of family member notified:  Wife Thayer Ohm and daughter [name] at the  beside by MD     PHYSICIAN Please sign FL2     Additional Comment:    _______________________________________________ Cristobal Goldmann, LCSW 03/27/2016, 12:03 PM

## 2016-03-27 NOTE — Discharge Instructions (Signed)
Follow with Primary MD GATES,ROBERT NEVILL, MD after discharge from SNF  Get CBC, CMP, 2 view Chest X ray checked  by Primary MD next visit.    Activity: As tolerated with Full fall precautions use walker/cane & assistance as needed   Disposition SNF   Diet: Heart Healthy, carbohydrate modified, dysphagia 3 with nectar thick , with feeding assistance and aspiration precautions.  For Heart failure patients - Check your Weight same time everyday, if you gain over 2 pounds, or you develop in leg swelling, experience more shortness of breath or chest pain, call your Primary MD immediately. Follow Cardiac Low Salt Diet and 1.5 lit/day fluid restriction.   On your next visit with your primary care physician please Get Medicines reviewed and adjusted.   Please request your Prim.MD to go over all Hospital Tests and Procedure/Radiological results at the follow up, please get all Hospital records sent to your Prim MD by signing hospital release before you go home.   If you experience worsening of your admission symptoms, develop shortness of breath, life threatening emergency, suicidal or homicidal thoughts you must seek medical attention immediately by calling 911 or calling your MD immediately  if symptoms less severe.  You Must read complete instructions/literature along with all the possible adverse reactions/side effects for all the Medicines you take and that have been prescribed to you. Take any new Medicines after you have completely understood and accpet all the possible adverse reactions/side effects.   Do not drive, operating heavy machinery, perform activities at heights, swimming or participation in water activities or provide baby sitting services if your were admitted for syncope or siezures until you have seen by Primary MD or a Neurologist and advised to do so again.  Do not drive when taking Pain medications.    Do not take more than prescribed Pain, Sleep and Anxiety  Medications  Special Instructions: If you have smoked or chewed Tobacco  in the last 2 yrs please stop smoking, stop any regular Alcohol  and or any Recreational drug use.  Wear Seat belts while driving.   Please note  You were cared for by a hospitalist during your hospital stay. If you have any questions about your discharge medications or the care you received while you were in the hospital after you are discharged, you can call the unit and asked to speak with the hospitalist on call if the hospitalist that took care of you is not available. Once you are discharged, your primary care physician will handle any further medical issues. Please note that NO REFILLS for any discharge medications will be authorized once you are discharged, as it is imperative that you return to your primary care physician (or establish a relationship with a primary care physician if you do not have one) for your aftercare needs so that they can reassess your need for medications and monitor your lab values.

## 2016-03-27 NOTE — Discharge Summary (Signed)
Osualdo Hansell, is a 82 y.o. male  DOB [birth year]  MRN [id number].  Admission date:  03/24/2016  Admitting Physician  Michael Litter, MD  Discharge Date:  03/27/2016   Primary MD  Pearla Dubonnet, MD  Recommendations for primary care physician for things to follow:  - Please check CBC, BMP in 3 days - Please check chest x-ray in 10 days. - Patient will need CBG monitoring 3 times a day and at bedtime, he has not been discharged on any oral hypoglycemic agent or insulin giving hypoglycemia during hospital stay and controlled CBG without insulin, reassess as needed. - Will need swallow evaluation and advance diet as tolerated, currently on dysphagia 3 with nectar thick.   Admission Diagnosis  UTI (lower urinary tract infection) [N39.0]   Discharge Diagnosis  UTI (lower urinary tract infection) [N39.0]    Principal Problem:   UTI (lower urinary tract infection) Active Problems:   Diabetes (HCC)   Stroke (HCC)   Left-sided weakness   Fever   Leukocytosis   Pressure ulcer      Past Medical History:  Diagnosis Date  . Alzheimer disease   . Benign prostate hyperplasia   . Diabetes (HCC)   . Dysphagia   . ED (erectile dysfunction)   . Gout   . Heart murmur   . 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 Hyperlipidemia   . Hypertension   . Kidney stones     Past Surgical History:  Procedure Laterality Date  . APPENDECTOMY    . LITHOTRIPSY    . TONSILLECTOMY    . UVULOPALATOPHARYNGOPLASTY     for snoring       History of present illness and  Hospital Course:     Kindly see H&P for history of present illness and admission details, please review complete Labs, Consult reports and Test reports for all details in brief  HPI  from the history and physical done on the day of admission 03/24/2016  HPI: Davonta Stroot is a 82 y.o.  gentleman with a history of CVA which left him with an expressive aphasia and residual left sided weakness, CKD, HTN, HLD, and DM who presents to the ED accompanied by his wife and daughter.  Family reports that he had gross hematuria on July 31st, which prompted presentation to his PCP.  He was not diagnosed with a UTI or put on antibiotics.  However, he was referred to Urology, and has an outpatient appointment next week.  However, last night he seemed to develop worsening weakness and increased slurred speech (different from his baseline since his stroke).  His wife has also noted urinary frequency, but the patient has not indicated dysuria.  He did not sleep well last night.  His wife reports temperature of 99 at home but no sweats of chills.  The patient has not indicated abdominal pain.  No nausea or vomiting.  The rising temperature prompted presentation to the ED today.    ED Course: Temperature in the  ED 100.4.  WBC count 11.4.  Head CT negative for acute process.  Lactic acid level normal. U/A is positive for nitrites and many bacteria.  Blood and urine cultures are pending.  Chest xray concerning for perihilar opacities that could represent early pneumonia.  The patient has received IV fluids and IV Rocephin.  Hospitalist asked to admit for further management  Hospital Course   82 y.o.gentleman with a history of CVA which left him with an expressive aphasia and residual left sided weakness, CKD, HTN, HLD, and DM who presents to the ED With complaints of weakness, urinary frequency and lethargy, workup significant for UTI, and questionable early pneumonia   UTI  - without evidence of sepsis, causing increased weakness in patient with history of CVA - Treated with IV Rocephin during hospital stay, urine culture growing Escherichia coli, will need another 7 days on oral levofloxacin as an outpatient - Blood cultures with no growth to date  AKI 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  Abnormal chest xray/CAP - Treated with IV Rocephin and Rocephin during hospital stay, to continue levofloxacin as an outpatient.  HTN --Continue home medications  DM - A1c is 5.7, oral medication has been hold on admission, CBGs has been controlled off insulin, actually had couple episodes of hypoglycemia, so recommendation is discharge with close monitoring with CBGs, no insulin or oral hypoglycemic agent, reassess as needed.  Recent CVA - Continue with Plavix  Hypertension - Continue with lisinopril    Discharge Condition:  Stable - Discussed with daughter at bedside   Follow UP  Follow-up Information    GATES,ROBERT NEVILL, MD .   Specialty:  Internal Medicine Why:  After discharge from SNF Contact information: [address] [place] [phone]             Discharge Instructions  and  Discharge Medications    Discharge Instructions    Discharge instructions    Complete by:  As directed   Follow with Primary MD GATES,ROBERT NEVILL, MD after discharge from SNF  Get CBC, CMP, 2 view Chest X ray checked  by Primary MD next visit.    Activity: As tolerated with Full fall precautions use walker/cane & assistance as needed   Disposition SNF   Diet: Heart Healthy, carbohydrate modified, dysphagia 3 with nectar thick , with feeding assistance and aspiration precautions.  For Heart failure patients - Check your Weight same time everyday, if you gain over 2 pounds, or you develop in leg swelling, experience more shortness of breath or chest pain, call your Primary MD immediately. Follow Cardiac Low Salt Diet and 1.5 lit/day fluid restriction.   On your next visit with your primary care physician please Get Medicines reviewed and adjusted.   Please request your Prim.MD to go over all Hospital Tests and Procedure/Radiological results at the follow up, please get all Hospital records sent to your Prim MD by  signing hospital release before you go home.   If you experience worsening of your admission symptoms, develop shortness of breath, life threatening emergency, suicidal or homicidal thoughts you must seek medical attention immediately by calling 911 or calling your MD immediately  if symptoms less severe.  You Must read complete instructions/literature along with all the possible adverse reactions/side effects for all the Medicines you take and that have been prescribed to you. Take any new Medicines after you have completely understood and accpet all the possible adverse reactions/side  effects.   Do not drive, operating heavy machinery, perform activities at heights, swimming or participation in water activities or provide baby sitting services if your were admitted for syncope or siezures until you have seen by Primary MD or a Neurologist and advised to do so again.  Do not drive when taking Pain medications.    Do not take more than prescribed Pain, Sleep and Anxiety Medications  Special Instructions: If you have smoked or chewed Tobacco  in the last 2 yrs please stop smoking, stop any regular Alcohol  and or any Recreational drug use.  Wear Seat belts while driving.   Please note  You were cared for by a hospitalist during your hospital stay. If you have any questions about your discharge medications or the care you received while you were in the hospital after you are discharged, you can call the unit and asked to speak with the hospitalist on call if the hospitalist that took care of you is not available. Once you are discharged, your primary care physician will handle any further medical issues. Please note that NO REFILLS for any discharge medications will be authorized once you are discharged, as it is imperative that you return to your primary care physician (or establish a relationship with a primary care physician if you do not have one) for your aftercare needs so that they can  reassess your need for medications and monitor your lab values.       Medication List    STOP taking these medications   glimepiride 4 MG tablet Commonly known as:  AMARYL   metFORMIN 500 MG 24 hr tablet Commonly known as:  GLUCOPHAGE-XR     TAKE these medications   atorvastatin 20 MG tablet Commonly known as:  LIPITOR Take 1 tablet (20 mg total) by mouth daily at 6 PM.   B-12 5000 MCG Tbdp Take 5,000 mcg by mouth daily.   clopidogrel 75 MG tablet Commonly known as:  PLAVIX Take 1 tablet (75 mg total) by mouth daily.   COLCRYS 0.6 MG tablet Generic drug:  colchicine Take 0.6 mg by mouth daily. Reported on 10/04/2015   docusate sodium 100 MG capsule Commonly known as:  COLACE Take 2 capsules (200 mg total) by mouth at bedtime. What changed:  when to take this   finasteride 5 MG tablet Commonly known as:  PROSCAR Take 5 mg by mouth daily at 12 noon.   fluticasone 50 MCG/ACT nasal spray Commonly known as:  FLONASE Place into both nostrils daily.   levofloxacin 500 MG tablet Commonly known as:  LEVAQUIN Take 1 tablet (500 mg total) by mouth daily. Start taking on:  03/28/2016   lisinopril 20 MG tablet Commonly known as:  PRINIVIL,ZESTRIL Take 20 mg by mouth 2 (two) times daily.   Melatonin 10 MG Tabs Take 10 mg by mouth at bedtime.         Diet and Activity recommendation: See Discharge Instructions above   Consults obtained -  none   Major procedures and Radiology Reports - PLEASE review detailed and final reports for all details, in brief -     Ct Head Wo Contrast  Result Date: 03/24/2016 CLINICAL DATA:  Increased confusion with disorientation.  Dementia. EXAM: CT HEAD WITHOUT CONTRAST TECHNIQUE: Contiguous axial images were obtained from the base of the skull through the vertex without intravenous contrast. COMPARISON:  10/05/2015 FINDINGS: Brain: No evidence of acute infarction, hemorrhage, hydrocephalus, extra-axial collection or mass  lesion/mass effect. Advanced chronic microvascular  disease with diffuse ischemic gliosis in the cerebral white matter and pons. Remote perforator infarct affecting the right internal capsule and corona radiata. Generalized atrophy Vascular: Diffuse atherosclerotic calcification. No hyperdense vessel. Skull: Negative Sinuses/Orbits: Bilateral cataract resection. Senescent globe calcifications. No acute finding. Other: None IMPRESSION: 1. No acute finding. 2. Atrophy and extensive chronic microvascular disease. Electronically Signed   By: Marnee SpringJonathon  Watts M.D.   On: 03/24/2016 22:19   Dg Chest Portable 1 View  Result Date: 03/24/2016 CLINICAL DATA:  Worsening speech today. EXAM: PORTABLE CHEST 1 VIEW COMPARISON:  October 04, 2015 FINDINGS: There is a prominent skin fold over the lateral left chest. There are lung markings on both sides and there is no convincing evidence of pneumothorax on today's study. The heart, hila, and mediastinum are stable. Increased opacities are seen centrally in the infrahilar regions, right greater than left. No other interval changes or acute abnormalities. IMPRESSION: Increased opacity in the perihilar regions may represent developing infiltrate. The appearance is more coarsened than typical for edema. No other acute abnormalities. Recommend follow-up to resolution. Electronically Signed   By: Gerome Samavid  Williams III M.D   On: 03/24/2016 21:43    Micro Results    Recent Results (from the past 240 hour(s))  Urine culture     Status: Abnormal   Collection Time: 03/24/16  9:33 PM  Result Value Ref Range Status   Specimen Description URINE, RANDOM  Final   Special Requests NONE  Final   Culture >=100,000 COLONIES/mL ESCHERICHIA COLI (A)  Final   Report Status 03/27/2016 FINAL  Final  Culture, blood (Routine x 2)     Status: None (Preliminary result)   Collection Time: 03/24/16  9:45 PM  Result Value Ref Range Status   Specimen Description BLOOD RIGHT ANTECUBITAL  Final    Special Requests   Final    BOTTLES DRAWN AEROBIC AND ANAEROBIC BLUE 6CC RED 5CC   Culture NO GROWTH 2 DAYS  Final   Report Status PENDING  Incomplete  Culture, blood (Routine x 2)     Status: None (Preliminary result)   Collection Time: 03/24/16  9:57 PM  Result Value Ref Range Status   Specimen Description BLOOD RIGHT FOREARM  Final   Special Requests BOTTLES DRAWN AEROBIC AND ANAEROBIC 5CC  Final   Culture NO GROWTH 2 DAYS  Final   Report Status PENDING  Incomplete       Today   Subjective:   Lorenza BurtonLloyd Hauger today has no headache,no chest pain, feels much better  Today., No cough or shortness of breath  Objective:   Blood pressure 132/75, pulse 75, temperature 98.6 F (37 C), temperature source Oral, resp. rate 18, height 6' (1.829 m), weight 62.1 kg (136 lb 12.8 oz), SpO2 98 %.   Intake/Output Summary (Last 24 hours) at 03/27/16 1333 Last data filed at 03/27/16 0900  Gross per 24 hour  Intake              720 ml  Output             1276 ml  Net             -556 ml    Exam  Awake Alert,  Supple Neck,No JVD,   Symmetrical Chest wall movement, Good air movement bilaterally, CTAB RRR,No Gallops,Rubs or new Murmurs, No Parasternal Heave +ve B.Sounds, Abd Soft, No tenderness,  No Cyanosis, Clubbing or edema, No new Rash or bruise   Data Review   CBC w Diff:  Lab Results  Component Value Date   WBC 7.4 03/25/2016   HGB 11.0 (L) 03/25/2016   HCT 34.9 (L) 03/25/2016   PLT 167 03/25/2016   LYMPHOPCT 8 03/24/2016   MONOPCT 7 03/24/2016   EOSPCT 1 03/24/2016   BASOPCT 0 03/24/2016    CMP:  Lab Results  Component Value Date   NA 142 03/25/2016   K 3.6 03/25/2016   CL 108 03/25/2016   CO2 25 03/25/2016   BUN 21 (H) 03/25/2016   CREATININE 1.04 03/25/2016   PROT 6.7 03/24/2016   ALBUMIN 3.8 03/24/2016   BILITOT 0.6 03/24/2016   ALKPHOS 80 03/24/2016   AST 17 03/24/2016   ALT 15 (L) 03/24/2016  .   Total Time in preparing paper work, data evaluation and  todays exam - 35 minutes  Avianah Pellman M.D on 03/27/2016 at 1:33 PM  [facility]   Office  [phone]

## 2016-03-27 NOTE — Clinical Social Work Note (Signed)
Clinical Social Work Assessment  Patient Details  Name: Roger [name] MRN: [id number] Date of Birth: Jun 23, 1931  Date of referral:  03/26/16               Reason for consult:  Facility Placement                Permission sought to share information with:  Family Supports Permission granted to share information::  No (Husband and wife (at the bedside) permitted daughter to talk with CSW.)  Name::     Roger [name]  Agency::     Relationship::  Daughter  Contact Information:  [phone]  Housing/Transportation Living arrangements for the past 2 months:  Single Family Home Source of Information:  Adult Children (Daughter, Roger [name]) Patient Interpreter Needed:  None Criminal Activity/Legal Involvement Pertinent to Current Situation/Hospitalization:  No - Comment as needed Significant Relationships:  Adult Children, Other Family Members Lives with:  Spouse Do you feel safe going back to the place where you live?  Yes (Wife/daughter understands and is agreeable to SNF for patient. Patient oriented only to self and place,) Need for family participation in patient care:  Yes (Comment)  Care giving concerns:  Daughter reported that contact has been made with [facility] as they were pleased with the care patient received in April '17 when he went there for rehab.   Social Worker assessment / plan:  CSW visited room and talked with patient's daughter Roger [name] and his wife as patient was being evaluated by speech therapist. Family in agreement with SNF for patient and wants [facility]. Daughter informed CSW that contact had been made with facility regarding patient going there for rehab.  Employment status:  Retired Medical illustrator) PT Recommendations:  Skilled Nursing Facility Information / Referral to community resources:  Other (Comment Required) (SNF list not provided as family has facility preference and had been in  contact with facility.)  Patient/Family's Response to care:  No concerns expressed by family regarding care during hospitalization.  Patient/Family's Understanding of and Emotional Response to Diagnosis, Current Treatment, and Prognosis:  Not discussed.  Emotional Assessment Appearance:  Appears stated age Attitude/Demeanor/Rapport:  Other (Patient was being assessed by speech therapy when CSW entered room) Affect (typically observed):  Quiet (Speech working with patient) Orientation:  Oriented to Self, Oriented to Place Alcohol / Substance use:  Never Used Psych involvement (Current and /or in the community):  No (Comment)  Discharge Needs  Concerns to be addressed:  Discharge Planning Concerns Readmission within the last 30 days:  No Current discharge risk:  None Barriers to Discharge:  No Barriers Identified   Cristobal Goldmann, LCSW 03/27/2016, 11:54 AM

## 2016-03-27 NOTE — Progress Notes (Signed)
Patient being discharged to [facility] room [facility]. Report given to [facility].

## 2016-03-29 LAB — CULTURE, BLOOD (ROUTINE X 2)
CULTURE: NO GROWTH
Culture: NO GROWTH

## 2016-05-08 ENCOUNTER — Ambulatory Visit
Admission: RE | Admit: 2016-05-08 | Discharge: 2016-05-08 | Disposition: A | Discharge status: Home / Self Care | Payer: Medicare Other | Source: Ambulatory Visit | Attending: Internal Medicine | Admitting: Internal Medicine

## 2016-05-08 ENCOUNTER — Other Ambulatory Visit: Payer: Self-pay | Admitting: Internal Medicine

## 2016-05-08 DIAGNOSIS — R509 Fever, unspecified: Secondary | ICD-10-CM

## 2016-09-04 ENCOUNTER — Ambulatory Visit (INDEPENDENT_AMBULATORY_CARE_PROVIDER_SITE_OTHER): Payer: Medicare Other | Admitting: Nurse Practitioner

## 2016-09-04 ENCOUNTER — Encounter: Payer: Self-pay | Admitting: Nurse Practitioner

## 2016-09-04 VITALS — BP 113/62 | HR 72 | Ht 72.0 in

## 2016-09-04 DIAGNOSIS — I69398 Other sequelae of cerebral infarction: Secondary | ICD-10-CM | POA: Diagnosis not present

## 2016-09-04 DIAGNOSIS — I10 Essential (primary) hypertension: Secondary | ICD-10-CM

## 2016-09-04 DIAGNOSIS — E785 Hyperlipidemia, unspecified: Secondary | ICD-10-CM

## 2016-09-04 DIAGNOSIS — I63312 Cerebral infarction due to thrombosis of left middle cerebral artery: Secondary | ICD-10-CM | POA: Diagnosis not present

## 2016-09-04 DIAGNOSIS — IMO0002 Reserved for concepts with insufficient information to code with codable children: Secondary | ICD-10-CM

## 2016-09-04 DIAGNOSIS — R269 Unspecified abnormalities of gait and mobility: Secondary | ICD-10-CM | POA: Diagnosis not present

## 2016-09-04 DIAGNOSIS — I69359 Hemiplegia and hemiparesis following cerebral infarction affecting unspecified side: Secondary | ICD-10-CM

## 2016-09-04 NOTE — Patient Instructions (Addendum)
Continue Plavix for secondary stroke prevention  Continue Lipitor for secondary stroke prevention.  Labs to be followed by primary care Dr. Kevan Ny Continue HEP using walker for safer ambulation  Hemoglobin A1c less than 7, last 5.7 on 03/25/16  Keep well hydrated prevent dehydration  Follow-up in 6 monthsn next with Roda Shutters

## 2016-09-04 NOTE — Progress Notes (Signed)
[facility]  PATIENT: Roger [name] DOB: 1930/10/20   REASON FOR VISIT:  follow-up for stroke, gait abnormality HISTORY FROM:patient, daughter  Roger [name]    HISTORY OF PRESENT ILLNESS:UPDATE 01/29/2018CM [name], 83 year old male returns for follow-up with history of nonhemorrhagic infarct in February 2017. He remains on Plavix for secondary stroke prevention with minimal bruising and no bleeding. He has not had further stroke or TIA symptoms. In addition he is on Lipitor for hyperlipidemia, his labs are monitored by his primary care doctor today. He is back home continuing to do his home exercise program every day and his daughter claims he walks at least 100 feet daily with his walker. He continues to have significant weakness, left side. He has been eating regular foods. Most recent hemoglobin A1c was 5.7 in August when he was hospitalized for a urinary tract infection. Hospital records reviewed. He returns for reevaluation   UPDATE 07/26/2017CM [name], 83 year old male returns for follow-up. He had acute nonhemorrhagic infarct extending from the posterior right lenticular nucleus extending to posterior right corona radiata.Remote small cerebellar infarcts bilaterally, right paracentral pontine infarct, basal ganglia infarcts, left thalamic infarct and small infarcts centrum semi ovale greater on the right.Moderate chronic microvascular changes.Global moderate atrophy without hydrocephalus. He is currently on Plavix and Lipitor for secondary stroke prevention. He has labs to be done next week. He was in a skilled nursing facility and has been home for about one week. Daughter who accompanies him today makes sure he does his home exercise program every day. Daughter is concerned that elderly wife is trying to transfer patient by herself and doesn't want help or assistance from her. He continues to have significant weakness on the left side. He has some mild drooling. He is on  thickened liquids. Less coughing spells according to daughter, gets chocked rarely. He returns for reevaluation    HISTORY 11/25/15 CMLloyd Mavis is an 83 y.o. male patient who was brought into the emergency room by the EMS for an acute onset of left facial weakness, and left sided limb weakness and slurred speech on 10/04/15.  Patient has dementia, and is a poor historian. He was unable to specify at one time he was last normal. He started saying that 2 days ago when he went to church, he felt normal but was unable to clarify if he woke up with no symptoms this morning. Patient lives with his wife at home. His wife was not available in the emergency room during  evaluation.The EMS who brought the patient, they were told that patient's symptoms started around 11 AM although there was no corroborative history available from anyone else. The initial blood pressure recorded by the EMS was systolic of 260. MRI of the brain Moderate-size acute nonhemorrhagic infarct extends from theposterior right lenticular nucleus extending to posterior right corona radiata.Remote small cerebellar infarcts bilaterally, right paracentral pontine infarct, basal ganglia infarcts, left thalamic infarct and small infarcts centrum semi ovale greater on the right.Moderate chronic microvascular changes.Global moderate atrophy without hydrocephalus.CT of the head and neck with diffuse intracranial atherosclerosis. LDL 125 hemoglobin A1c 8.1. He was on aspirin prior to admission now on Plavix. He spent several weeks in inpatient rehabilitation and is now a skilled facility receiving physical therapy speech therapy and occupational therapy. He continues to have significant weakness on the left side. He has some mild drooling. He is on thickened liquids. He has not had further stroke or TIA symptoms. The goal is to take him back home  at some point.  Prior to this admission he had been living independently in his on home.    REVIEW OF  SYSTEMS: Full 14 system review of systems performed and notable only for those listed, all others are neg:  Constitutional: neg  Cardiovascular: neg Ear/Nose/Throat: neg  Skin: neg Eyes: neg Respiratory: neg Gastroitestinal:  constipation  Hematology/Lymphatic:  neg Endocrine: neg Musculoskeletal: gait abnormality Allergy/Immunology: neg Neurological: memory loss weakness  Psychiatric: neg Sleep :  Insomnia at times  ALLERGIES: Allergies  Allergen Reactions  . Aricept [Donepezil Hcl]     Nightmares, Hallucinations  . Namenda [Memantine Hcl]     Nightmares, Hallucinations    HOME MEDICATIONS: Outpatient Medications Prior to Visit  Medication Sig Dispense Refill  . atorvastatin (LIPITOR) 20 MG tablet Take 1 tablet (20 mg total) by mouth daily at 6 PM. 30 tablet 0  . clopidogrel (PLAVIX) 75 MG tablet Take 1 tablet (75 mg total) by mouth daily. 30 tablet 0  . COLCRYS 0.6 MG tablet Take 0.6 mg by mouth daily. Reported on 10/04/2015    . docusate sodium (COLACE) 100 MG capsule Take 2 capsules (200 mg total) by mouth at bedtime. (Patient taking differently: Take 200 mg by mouth daily. ) 10 capsule 0  . finasteride (PROSCAR) 5 MG tablet Take 5 mg by mouth daily at 12 noon.    . fluticasone (FLONASE) 50 MCG/ACT nasal spray Place into both nostrils daily.    Marland Kitchen lisinopril (PRINIVIL,ZESTRIL) 20 MG tablet Take 20 mg by mouth 2 (two) times daily.    . Melatonin 10 MG TABS Take 10 mg by mouth at bedtime.     . Methylcobalamin (B-12) 5000 MCG TBDP Take 5,000 mcg by mouth daily.     No facility-administered medications prior to visit.     PAST MEDICAL HISTORY: Past Medical History:  Diagnosis Date  . Alzheimer disease   . Benign prostate hyperplasia   . Diabetes (HCC)   . Dysphagia   . ED (erectile dysfunction)   . Gout   . Heart murmur   . 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 Hyperlipidemia   . Hypertension   . Kidney  stones     PAST SURGICAL HISTORY: Past Surgical History:  Procedure Laterality Date  . APPENDECTOMY    . LITHOTRIPSY    . TONSILLECTOMY    . UVULOPALATOPHARYNGOPLASTY     for snoring    FAMILY HISTORY: Family History  Problem Relation Age of Onset  . Atrial fibrillation Mother   . Atrial fibrillation Maternal Aunt   . Ataxia Neg Hx   . Chorea Neg Hx   . Dementia Neg Hx   . Mental retardation Neg Hx   . Migraines Neg Hx   . Multiple sclerosis Neg Hx   . Neurofibromatosis Neg Hx   . Neuropathy Neg Hx   . Parkinsonism Neg Hx   . Seizures Neg Hx   . Stroke Neg Hx     SOCIAL HISTORY: Social History   Social History  . Marital status: Married    Spouse name: N/A  . Number of children: N/A  . Years of education: N/A   Occupational History  . retired     Management consultant   Social History Main Topics  . Smoking status: Never Smoker  . Smokeless tobacco: Never Used  . Alcohol use No  . Drug use: No  . Sexual activity: Not on file   Other Topics Concern  .  Not on file   Social History Narrative   Residing at [facility].      PHYSICAL EXAM  Vitals:   09/04/16 1245  BP: 113/62  Pulse: 72  Height: 6' (1.829 m)   There is no height or weight on file to calculate BMI.  Generalized: Well developed, in no acute distress  Head: normocephalic and atraumatic,. Oropharynx benign  Neck: Supple, no carotid bruits  Cardiac: Regular rate rhythm,  Soft murmur  Musculoskeletal: No deformity   Neurological examination   Mentation: Alert oriented to  place, history taking.Follows all commands ,speech  With mild dysarthria. Soft voice  Cranial nerve II-XII:  Pupils were equal round reactive to light extraocular movements were full, visual field were full on confrontational test. Facial sensation  Normal  Mild left facial droop.Hearing was intact to finger rubbing bilaterally. Uvula tongue midline. head turning and shoulder shrug were normal and symmetric.Tongue  protrusion into cheek strength was normal. Motor: normal bulk and tone, full strength in the BUE, BLE,  On the right , left upper extremity 0 out of 5 left lower extremity 1 out of 5 Sensory: normal and symmetric to light touch, pinprick, and  Vibration, in the upper and lower extremities Coordination: finger-nose-finger, heel-to-shin bilaterally, on the right, unable to perform on the left Reflexes:  1+ upper lower and symmetric plantar responses were flexor bilaterally. Gait and Station:  Not tested due to safety reasons in wheelchair DIAGNOSTIC DATA (LABS, IMAGING, TESTING) - I reviewed patient records, labs, notes, testing and imaging myself where available.  Lab Results  Component Value Date   WBC 7.4 03/25/2016   HGB 11.0 (L) 03/25/2016   HCT 34.9 (L) 03/25/2016   MCV 93.8 03/25/2016   PLT 167 03/25/2016      Component Value Date/Time   NA 142 03/25/2016 0405   K 3.6 03/25/2016 0405   CL 108 03/25/2016 0405   CO2 25 03/25/2016 0405   GLUCOSE 153 (H) 03/25/2016 0405   BUN 21 (H) 03/25/2016 0405   CREATININE 1.04 03/25/2016 0405   CALCIUM 8.8 (L) 03/25/2016 0405   PROT 6.7 03/24/2016 2145   ALBUMIN 3.8 03/24/2016 2145   AST 17 03/24/2016 2145   ALT 15 (L) 03/24/2016 2145   ALKPHOS 80 03/24/2016 2145   BILITOT 0.6 03/24/2016 2145   GFRNONAA >60 03/25/2016 0405   GFRAA >60 03/25/2016 0405   Lab Results  Component Value Date   CHOL 196 10/05/2015   HDL 45 10/05/2015   LDLCALC 125 (H) 10/05/2015   TRIG 130 10/05/2015   CHOLHDL 4.4 10/05/2015   Lab Results  Component Value Date   HGBA1C 5.7 (H) 03/25/2016   Lab Results  Component Value Date   VITAMINB12 957 (H) 03/25/2016   Lab Results  Component Value Date   TSH 2.225 10/04/2015      ASSESSMENT AND PLAN  83 y.o. year old male  has a past medical history of Diabetes (HCC); Hypertension; Gout; ED (erectile dysfunction); Heart murmur; Kidney stones; Dysphagia; Benign prostate hyperplasia; Hyperlipidemia;  Hemiplegia and hemiparesis following cerebral infarction affecting left nondominant side (HCC); and Alzheimer disease here   for follow-up The patient is a current patient of Dr. Roda Shutters  who is out of the office today . This note is sent to the work in doctor.      PLAN: Continue Plavix for secondary stroke prevention  Continue Lipitor for secondary stroke prevention.  Labs to be followed by primary care Dr. Kevan Ny due next week  per daughter Continue HEP daily, walk with quad cane try to slowly increase walking distance  Hemoglobin A1c less than 7, last 5.7 on 03/25/16.   Keep well hydrated to prevent dehydration  Follow-up in 6 months next with Dr. Roda Shutters if stable may discharge I spent 15 minutes in total face to face time with the patient and daughter, more than 50% of which was spent counseling and coordination of care, reviewing test results reviewing medications and discussing and reviewing the diagnosis of stroke  And answering questions. Nilda Riggs, [facility], [facility], APRN  [facility] [address], [address] [place], [place] [address] [phone]

## 2016-10-15 NOTE — Progress Notes (Signed)
Personally  participated in, made any corrections needed, and agree with history, physical, neuro exam,assessment and plan as stated above.    Antonia Ahern, MD [facility] 

## 2017-03-12 ENCOUNTER — Encounter (INDEPENDENT_AMBULATORY_CARE_PROVIDER_SITE_OTHER): Payer: Self-pay

## 2017-03-12 ENCOUNTER — Ambulatory Visit (INDEPENDENT_AMBULATORY_CARE_PROVIDER_SITE_OTHER): Payer: Medicare Other | Admitting: Neurology

## 2017-03-12 ENCOUNTER — Encounter: Payer: Self-pay | Admitting: Neurology

## 2017-03-12 VITALS — BP 105/71 | HR 75

## 2017-03-12 DIAGNOSIS — E118 Type 2 diabetes mellitus with unspecified complications: Secondary | ICD-10-CM

## 2017-03-12 DIAGNOSIS — I63312 Cerebral infarction due to thrombosis of left middle cerebral artery: Secondary | ICD-10-CM | POA: Diagnosis not present

## 2017-03-12 DIAGNOSIS — I69354 Hemiplegia and hemiparesis following cerebral infarction affecting left non-dominant side: Secondary | ICD-10-CM | POA: Diagnosis not present

## 2017-03-12 DIAGNOSIS — I10 Essential (primary) hypertension: Secondary | ICD-10-CM

## 2017-03-12 DIAGNOSIS — E785 Hyperlipidemia, unspecified: Secondary | ICD-10-CM

## 2017-03-12 NOTE — Patient Instructions (Signed)
-   Continue Plavix and lipitor for secondary stroke prevention  - check BP and glucose at home and record - consider PT/OT if any condition changes.    - Keep well hydrated to prevent dehydration - Follow up with your primary care physician for stroke risk factor modification. Recommend maintain blood pressure goal <130/80, diabetes with hemoglobin A1c goal below 7.0% and lipids with LDL cholesterol goal below 70 mg/dL.  - home exercise and avoid fall - follow up as needed

## 2017-03-12 NOTE — Progress Notes (Signed)
STROKE NEUROLOGY FOLLOW UP NOTE  NAME: Roger [name] DOB: [birth year]  REASON FOR VISIT: stroke follow up HISTORY FROM: daughter and chart  Today we had the pleasure of seeing Kai Furukawa in follow-up at our Neurology Clinic. Pt was accompanied by daughter.   History Summary Mr. Camareon Villacres is a 83 y.o. male with history of DM, HTN, HLD admitted on 10/04/15 for left facial and left side weakness. MRI showed right BG infarct, and CTA head and neck showed diffuse athero but no LVO. TTE unremarkable. LDL 125 and A1C 8.1. His ASA changed to plavix and also discharged with lipitor 20.   Follow up 11/25/15 - He spent several weeks in inpatient rehabilitation and is now a skilled facility receiving physical therapy speech therapy and occupational therapy. He continues to have significant weakness on the left side. He has some mild drooling. He is on thickened liquids. He has not had further stroke or TIA symptoms. The goal is to take him back home  at some point.  Prior to this admission he had been living independently in his on home.  Follow up 03/01/16 - He is currently on Plavix and Lipitor for secondary stroke prevention. He has labs to be done next week. He was in a skilled nursing facility and has been home for about one week. Daughter who accompanies him today makes sure he does his home exercise program every day. Daughter is concerned that elderly wife is trying to transfer patient by herself and doesn't want help or assistance from her. He continues to have significant weakness on the left side. He has some mild drooling. He is on thickened liquids. Less coughing spells according to daughter, gets chocked rarely. He returns for reevaluation.  09/04/16 follow up - He remains on Plavix for secondary stroke prevention with minimal bruising and no bleeding. He has not had further stroke or TIA symptoms. In addition he is on Lipitor for hyperlipidemia, his labs are monitored by his primary care doctor  today. He is back home continuing to do his home exercise program every day and his daughter claims he walks at least 100 feet daily with his walker. He continues to have significant weakness, left side. He has been eating regular foods. Most recent hemoglobin A1c was 5.7 in August when he was hospitalized for a urinary tract infection. Hospital records reviewed. He returns for reevaluation  Interval History During the interval time, the patient has been doing stable. He follows in stroke clinic with Darrol Angel NP since discharge. Still has significant left hemiplegia. Not able to move LUE at all. At home his daughter can help him standing up from wheelchair and walking with semi-walker but has to have a belt holding him. Glucose 80-180 at home, BP 120s/70s. He has 04/16/17 appointment with PCP Dr. Kevan Ny. BP today 105/71.   REVIEW OF SYSTEMS: Full 14 system review of systems performed and notable only for those listed below and in HPI above, all others are negative:  Constitutional:   Cardiovascular:  Ear/Nose/Throat:   Skin:  Eyes:   Respiratory:   Gastroitestinal:   Genitourinary:  Hematology/Lymphatic:   Endocrine:  Musculoskeletal:   Allergy/Immunology:   Neurological:   Psychiatric:  Sleep:   The following represents the patient's updated allergies and side effects list: Allergies  Allergen Reactions  . Aricept [Donepezil Hcl]     Nightmares, Hallucinations  . Namenda [Memantine Hcl]     Nightmares, Hallucinations    The neurologically relevant items on the patient's  problem list were reviewed on today's visit.  Neurologic Examination  A problem focused neurological exam (12 or more points of the single system neurologic examination, vital signs counts as 1 point, cranial nerves count for 8 points) was performed.  Blood pressure 105/71, pulse 75.  General - Well nourished, well developed, in no apparent distress.  Ophthalmologic - Fundi not visualized due to eye  movement.  Cardiovascular - Regular rate and rhythm with no murmur.  Mental Status -  Level of arousal and orientation to place, and person were intact, but not orientated to time or age. Language including expression, naming, repetition, comprehension was assessed and found intact, hypophonia and dysarthria.  Cranial Nerves II - XII - II - Visual field intact OU. III, IV, VI - Extraocular movements intact. V - Facial sensation intact bilaterally. VII - mild right nasolabial fold flattening. VIII - Hearing & vestibular intact bilaterally. X - Palate elevates symmetrically, dysarthria. XI - Chin turning & shoulder shrug intact bilaterally. XII - Tongue protrusion intact.  Motor Strength - The patient's strength was 4+/5 RUE and 3+/5 LUE, but LUE 1/5 proximal and 0/5 distal. LLE 2/5 proximal and 0/5 distal.  Bulk was symmetrical and fasciculations were absent.   Motor Tone - Muscle tone was assessed at the neck and appendages and was mildly increased on the LUE and LLE. Marland Kitchen  Reflexes - The patient's reflexes were 1+ in all extremities and he had no pathological reflexes.  Sensory - Light touch, temperature/pinprick were assessed and were normal.    Coordination - The patient had normal movements in the right hand with no ataxia or dysmetria.  Tremor was absent.  Gait and Station - in wheelchair, not tested.    Data reviewed: I personally reviewed the images and agree with the radiology interpretations.  Ct Angio Head and neck W/cm &/or Wo Cm 10/05/2015  IMPRESSION: 1. No acute arterial finding. 2. Cervical and vertebral artery atherosclerosis. High-grade non dominant right vertebral ostium and V4 stenoses. 3. Intracranial atherosclerosis as described. 4. Patchy opacity in the right lung with inflammatory appearance. Correlate for symptoms of active pneumonitis/pneumonia. Electronically Signed   By: Marnee Spring M.D.   On: 10/05/2015 11:31   Ct Head Wo Contrast 10/04/2015   IMPRESSION: No acute intracranial abnormality.   Mr Brain Wo Contrast 10/05/2015  IMPRESSION: Moderate-size acute nonhemorrhagic infarct extends from the posterior right lenticular nucleus extending to posterior right corona radiata. Remote small cerebellar infarcts bilaterally, right paracentral pontine infarct, basal ganglia infarcts, left thalamic infarct and small infarcts centrum semi ovale greater on the right. Moderate chronic microvascular changes. Global moderate atrophy without hydrocephalus. Abnormal appearance right vertebral artery.    2D echo - Left ventricle: The cavity size was normal. There was severe   hypertrophy of the septum and moderate hypertrophy of the   posterior wall. Systolic function was normal. Wall motion was   normal; there were no regional wall motion abnormalities. Doppler   parameters are consistent with abnormal left ventricular   relaxation (grade 1 diastolic dysfunction). Doppler parameters   are consistent with high ventricular filling pressure. - Aortic valve: Valve mobility was restricted. There was moderate   stenosis. There was mild regurgitation. Peak velocity (S): 334   cm/s. Mean gradient (S): 27 mm Hg. Valve area (VTI): 0.75 cm^2.   Valve area (Vmax): 0.88 cm^2. Valve area (Vmean): 0.81 cm^2. - Mitral valve: Mobility of the posterior leaflet was restricted.   The findings are consistent with moderate stenosis. There was  mild regurgitation. Mean gradient (D): 6 mm Hg. Valve area by   continuity equation (using LVOT flow): 1.1 cm^2. - Left atrium: The atrium was severely dilated. - Tricuspid valve: There was mild regurgitation. - Pulmonary arteries: Systolic pressure was within the normal   range. - Inferior vena cava: The vessel was normal in size. The   respirophasic diameter changes were in the normal range (>= 50%),   consistent with normal central venous pressure. - Pericardium, extracardiac: There was a left pleural  effusion.  03/24/2016 1. No acute finding. 2. Atrophy and extensive chronic microvascular disease.  Component     Latest Ref Rng & Units 10/04/2015 10/05/2015 03/25/2016  Cholesterol     0 - 200 mg/dL  540   Triglycerides     <150 mg/dL  981   HDL Cholesterol     >40 mg/dL  45   Total CHOL/HDL Ratio     RATIO  4.4   VLDL     0 - 40 mg/dL  26   LDL (calc)     0 - 99 mg/dL  191 (H)   Hemoglobin Y7W     4.8 - 5.6 %  8.1 (H) 5.7 (H)  Mean Plasma Glucose     mg/dL  295 621  TSH     3.086 - 4.500 uIU/mL 2.225    Vitamin B12     180 - 914 pg/mL   957 (H)    Assessment: As you may recall, he is a 83 y.o. Caucasian male with PMH of DM, HTN, HLD admitted on 10/04/15 for left facial and left side weakness. MRI showed right BG infarct, and CTA head and neck showed diffuse athero but no LVO. TTE unremarkable. LDL 125 and A1C 8.1. His ASA changed to plavix and also discharged with lipitor 20. During the interval time, the patient has been doing stable.finished PT/OT. Still has significant left hemiplegia. Glucose and BP stable. Follows with Dr. Kevan Ny. Dr. Ricka Burdock ordered aricept for his cognitive decline but he can not tolerate it. Currently on plavix and lipitor. Wheelchair bound most of the time.    Plan:  - Continue Plavix and lipitor for secondary stroke prevention  - check BP and glucose at home and record - consider PT/OT if any condition changes.    - Keep well hydrated to prevent dehydration - Follow up with your primary care physician for stroke risk factor modification. Recommend maintain blood pressure goal <130/80, diabetes with hemoglobin A1c goal below 7.0% and lipids with LDL cholesterol goal below 70 mg/dL.  - home exercise and avoid fall - follow up as needed   I spent more than 25 minutes of face to face time with the patient. Greater than 50% of time was spent in counseling and coordination of care.    No orders of the defined types were placed in this encounter.   No  orders of the defined types were placed in this encounter.   Patient Instructions  - Continue Plavix and lipitor for secondary stroke prevention  - check BP and glucose at home and record - consider PT/OT if any condition changes.    - Keep well hydrated to prevent dehydration - Follow up with your primary care physician for stroke risk factor modification. Recommend maintain blood pressure goal <130/80, diabetes with hemoglobin A1c goal below 7.0% and lipids with LDL cholesterol goal below 70 mg/dL.  - home exercise and avoid fall - follow up as needed     Marvel Plan,  MD PhD [facility] [address], [place], [place] [address] [phone]

## 2017-08-05 IMAGING — CT CT HEAD W/O CM
1 series · 16 of 30 positions shown, 20 images · non-contrast
Comparison: MRI of 03/12/2014.

ADDENDUM:
Return call at [DATE] p.m. with direct communication with Dr.
Nega.
CLINICAL DATA: Slurred speech. Left-sided facial droop.
Hypertension and diabetes.

EXAM:
CT HEAD WITHOUT CONTRAST
TECHNIQUE: Contiguous axial images were obtained from the base of the skull
through the vertex without intravenous contrast.

[Series 2: head 5.0 h30s · axial · 0.44mm/px · z∈[-138,-3]mm · 16 of 31 slices shown, 20 images]
[im 2/31  brain]
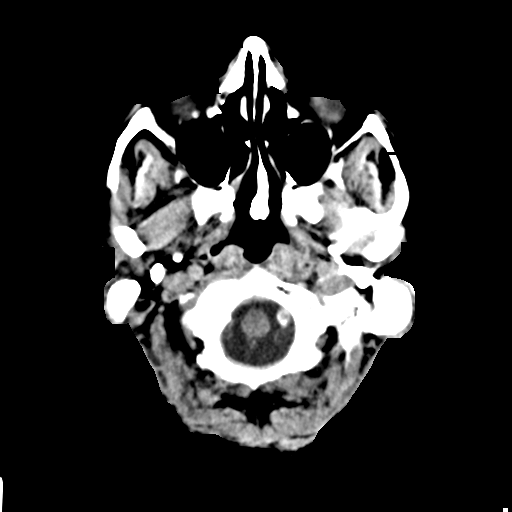
[im 2/31  bone]
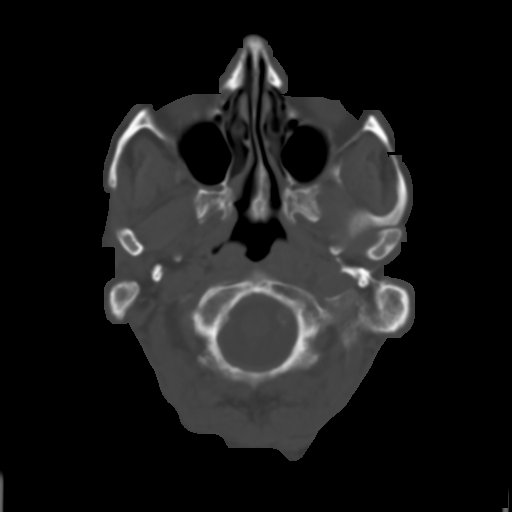
[im 4/31  brain]
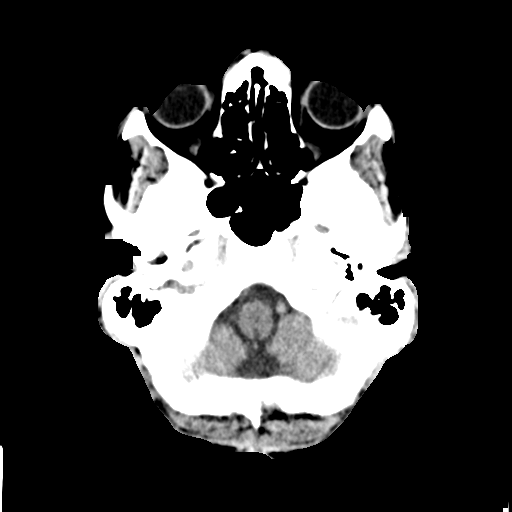
[im 6/31  brain]
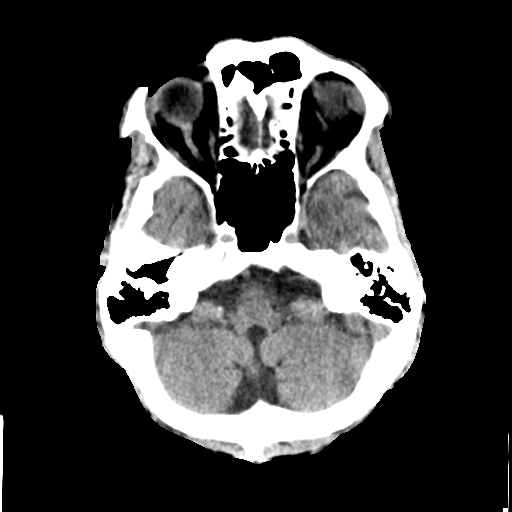
[im 8/31  brain]
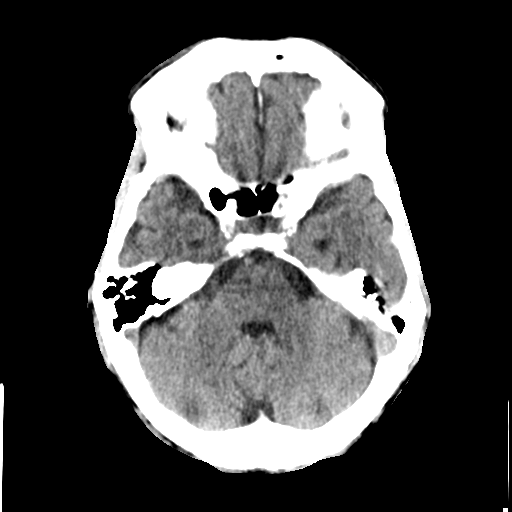
[im 9/31  brain]
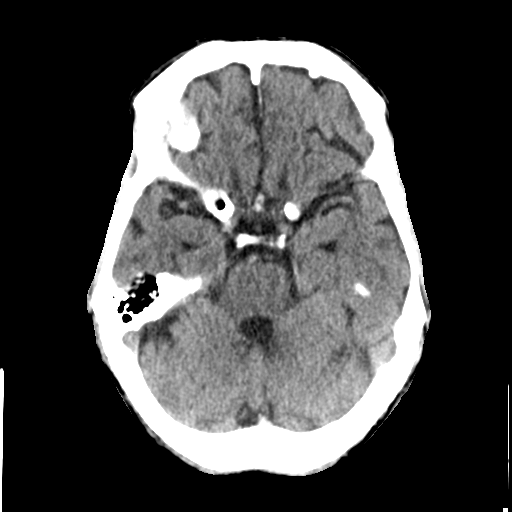
[im 9/31  bone]
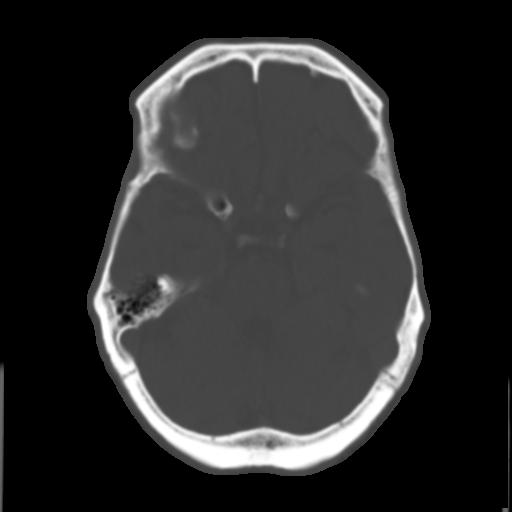
[im 11/31  brain]
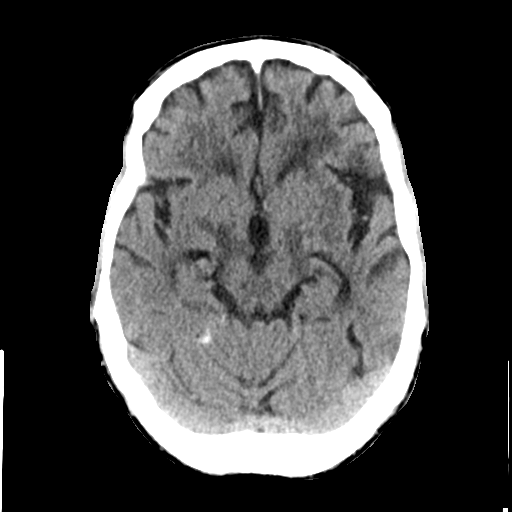
[im 13/31  brain]
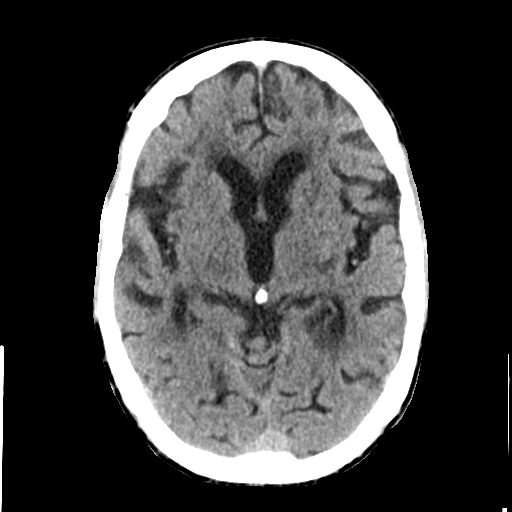
[im 15/31  brain]
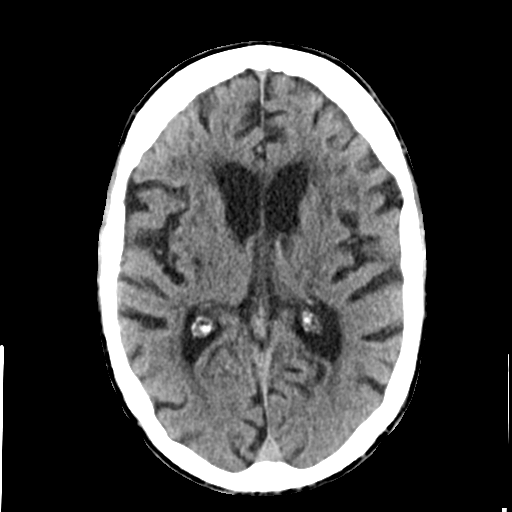
[im 16/31  brain]
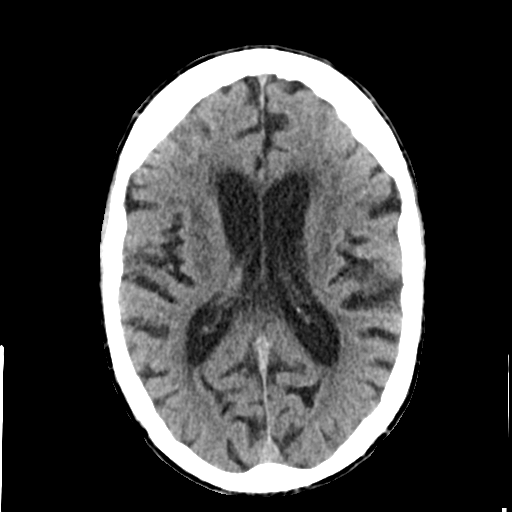
[im 16/31  bone]
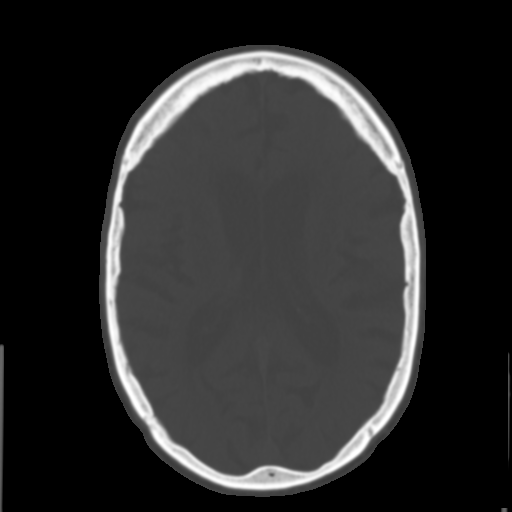
[im 18/31  brain]
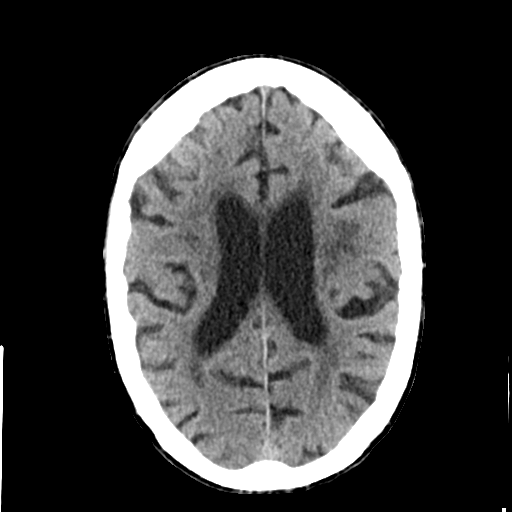
[im 20/31  brain]
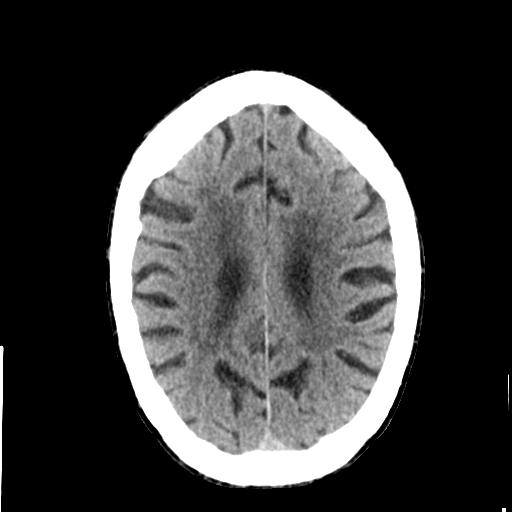
[im 22/31  brain]
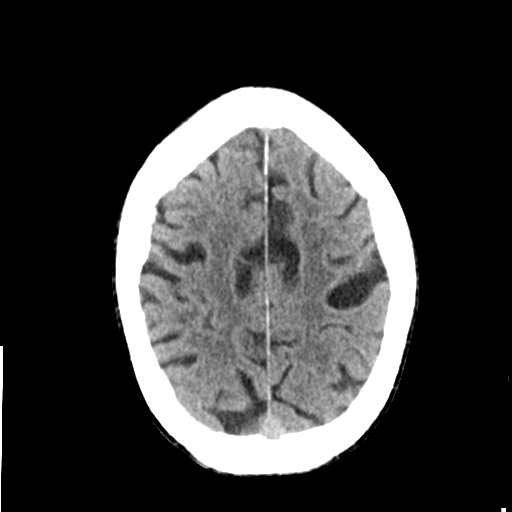
[im 23/31  brain]
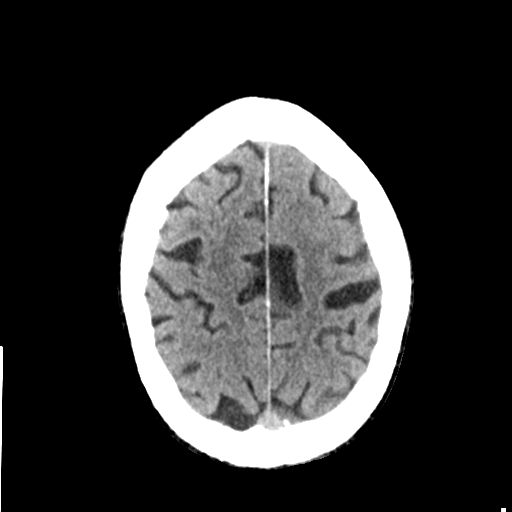
[im 23/31  bone]
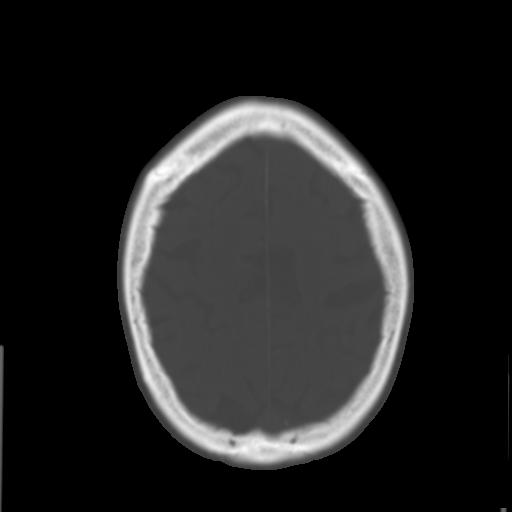
[im 25/31  brain]
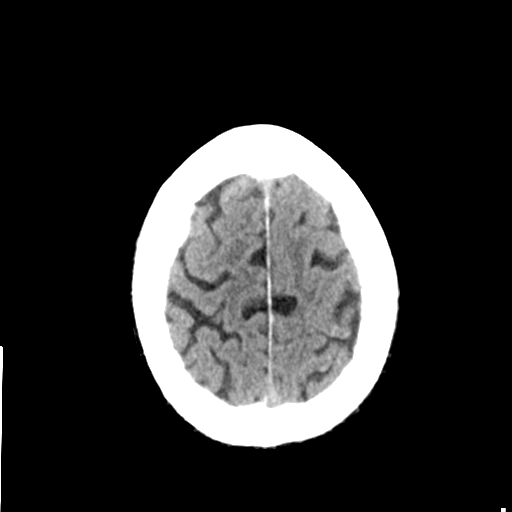
[im 27/31  brain]
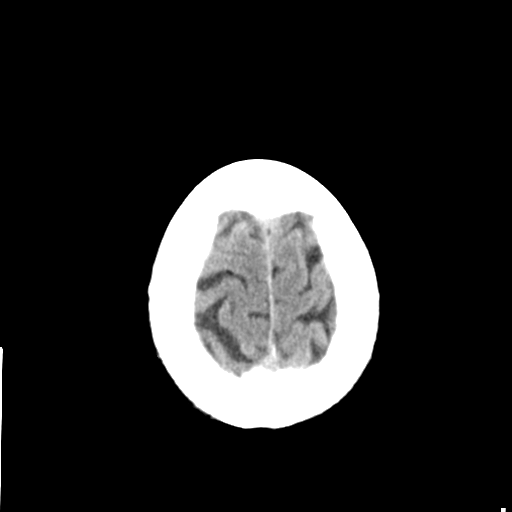
[im 29/31  brain]
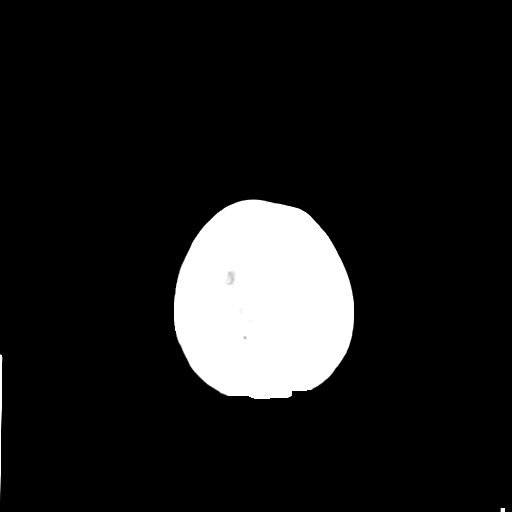

[16 of 30 positions shown; findings below may reference images not displayed]

FINDINGS: Sinuses/Soft tissues: Surgical changes about both globes. Minimal
mucosal thickening of ethmoid air cells. Hypoplastic frontal
sinuses. Clear mastoid air cells.

Intracranial: Expected cerebral volume loss for age. Mild for age
low density in the periventricular white matter likely related to
small vessel disease. Bilateral vertebral and carotid intracranial
atherosclerosis. No mass lesion, hemorrhage, hydrocephalus, acute
infarct, intra-axial, or extra-axial fluid collection.
IMPRESSION: No acute intracranial abnormality.

The referring physician was paged at 3 p.m. and a return call is
pending.

## 2017-08-06 IMAGING — CT CT ANGIO NECK
2 of 7 series · 8 of 33 positions shown · IV contrast (OMNI 350)
Comparison: Head CT from yesterday

CLINICAL DATA: Stroke.

EXAM:
CT ANGIOGRAPHY HEAD AND NECK
TECHNIQUE: Multidetector CT imaging of the head and neck was performed using
the standard protocol during bolus administration of intravenous
contrast. Multiplanar CT image reconstructions and MIPs were
obtained to evaluate the vascular anatomy. Carotid stenosis
measurements (when applicable) are obtained utilizing NASCET
criteria, using the distal internal carotid diameter as the
denominator.
CONTRAST:  50mL OMNIPAQUE IOHEXOL 350 MG/ML SOLN

[Series 9: cta neck · axial · 0.44mm/px · z∈[-308,-168]mm · 2 of 210 slices shown]
[im 70/210  soft-tissue]
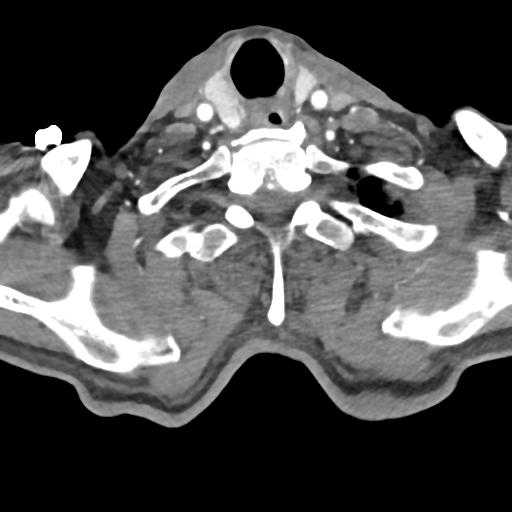
[im 140/210  soft-tissue]
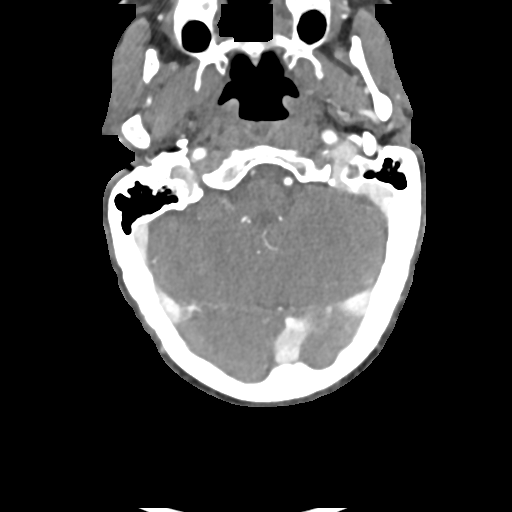

[Series 11: cta neck axial · axial · 0.39mm/px · z∈[-386,-88]mm · 6 of 418 slices shown]
[im 60/418  soft-tissue]
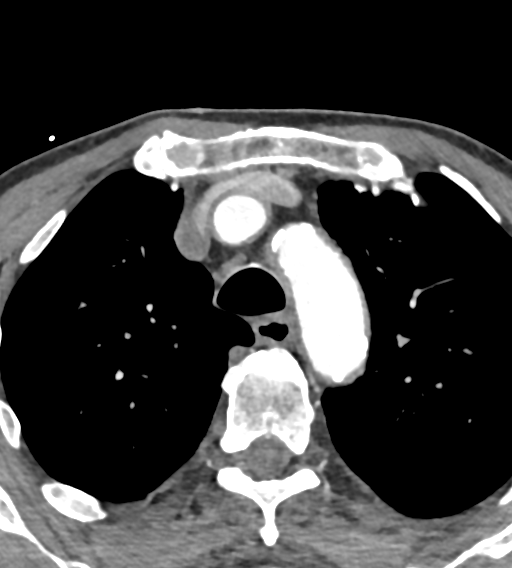
[im 120/418  bone]
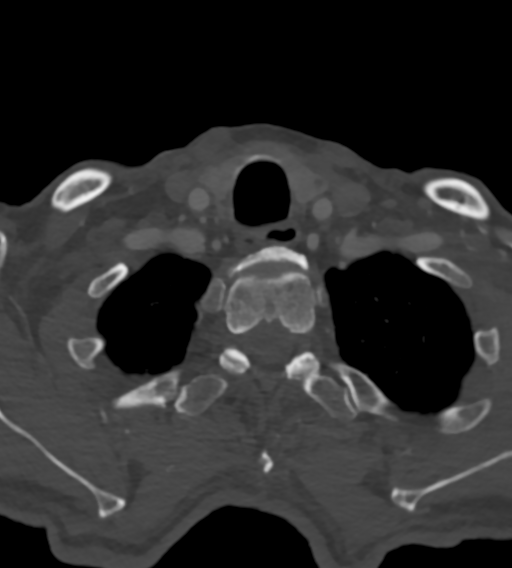
[im 179/418  soft-tissue]
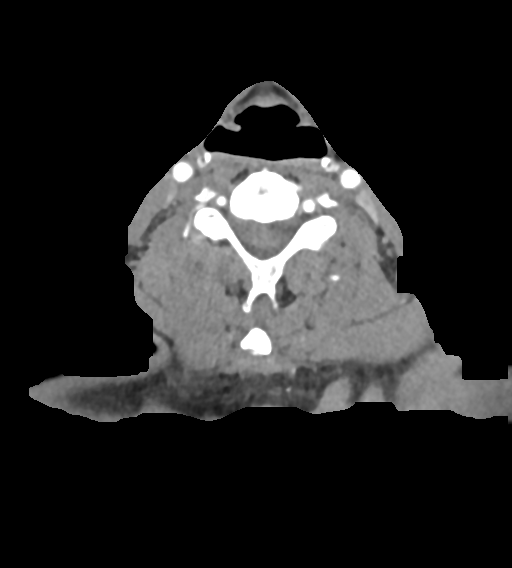
[im 239/418  bone]
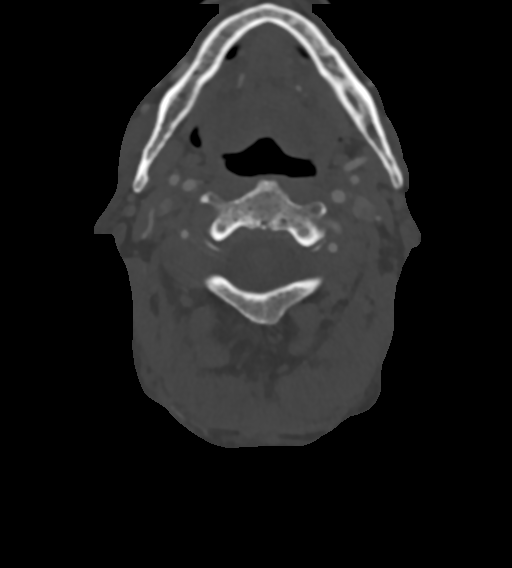
[im 298/418  soft-tissue]
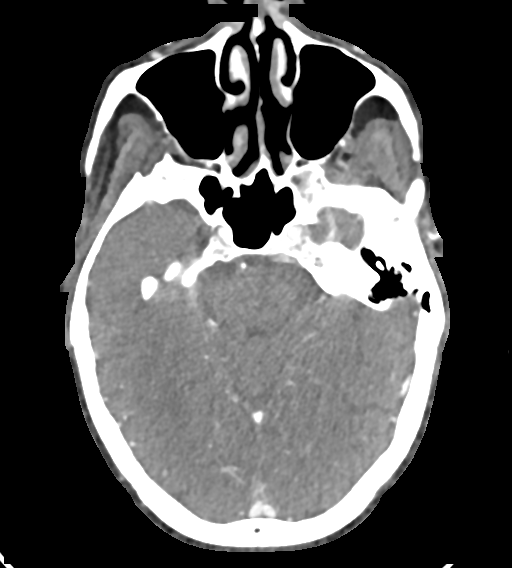
[im 358/418  bone]
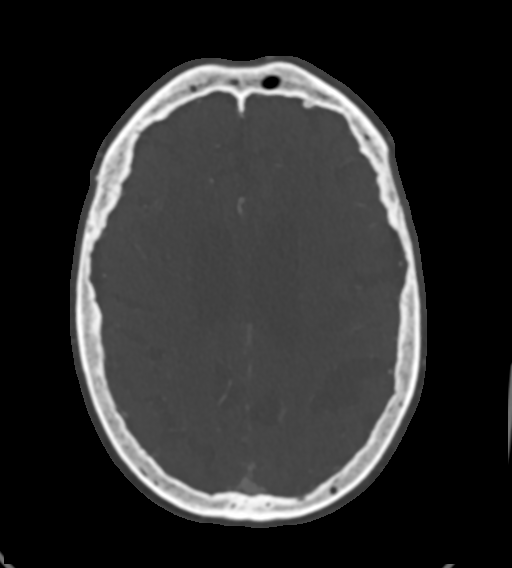

[8 of 33 positions shown; findings below may reference images not displayed]

FINDINGS: CTA NECK

Aortic arch: No aneurysm or dissection. 2 vessel branching.
Extensive atherosclerotic plaque mixed area

Right carotid system: Diffuse atheromatous wall thickening. No flow
limiting (50% or greater) stenosis. No dissection.

Left carotid system:Diffuse atheromatous wall thickening. No flow
limiting (50% or greater) stenosis.

Vertebral arteries: No dissection. No proximal subclavian flow
limiting stenosis. Left vertebral artery dominance. No evidence of
dissection. High-grade right ostial stenosis.

Skeleton: No contributory findings

Other neck: Patchy ground-glass and nodular opacities in the right
upper lobe with a infectious/ inflammatory appearance

CTA HEAD

Anterior circulation: Symmetric carotid arteries. Hypoplastic right
A1 segment with anterior communicating artery. Bilateral posterior
communicating arteries. No proximal occlusion or flow limiting
stenosis. Atherosclerotic calcification on the carotid siphons.
High-grade distal left M2 branch stenosis best seen on coronal
reformats image 106. Elsewhere atherosclerotic irregularity and
narrowing is mild to moderate.

Posterior circulation: Left dominant. Early branching right PICA.
The right V4 segment diffuse atherosclerotic type irregularity, with
minimal if any contribution to the basilar. These narrowings are
beyond the PICA origin. Atherosclerotic irregularity of the basilar
which is narrow in the setting of sizable posterior communicating
arteries. No major branch occlusion. Congenitally small left P1
segment with superimposed atherosclerotic irregularity. Best seen on
coronal reformats there is moderate stenosis of right P4 branch.
Negative for aneurysm.

Venous sinuses: Patent

Anatomic variants: Intact circle-of-Willis

Delayed phase: Not performed
IMPRESSION: 1. No acute arterial finding.
2. Cervical and vertebral artery atherosclerosis. High-grade non
dominant right vertebral ostium and V4 stenoses.
3. Intracranial atherosclerosis as described.
4. Patchy opacity in the right lung with inflammatory appearance.
Correlate for symptoms of active pneumonitis/pneumonia.

## 2019-03-08 DEATH — deceased
